# Patient Record
Sex: Female | Born: 1935 | Race: White | Hispanic: No | Smoking: Never smoker
Health system: Southern US, Community
[De-identification: ages and names within clinical notes are randomized; demographics above are authoritative.]

## PROBLEM LIST (undated history)

## (undated) ENCOUNTER — Emergency Department (HOSPITAL_COMMUNITY): Payer: Medicare Other

## (undated) DIAGNOSIS — M109 Gout, unspecified: Secondary | ICD-10-CM

## (undated) DIAGNOSIS — D45 Polycythemia vera: Secondary | ICD-10-CM

## (undated) DIAGNOSIS — I447 Left bundle-branch block, unspecified: Secondary | ICD-10-CM

## (undated) DIAGNOSIS — R55 Syncope and collapse: Secondary | ICD-10-CM

## (undated) DIAGNOSIS — I442 Atrioventricular block, complete: Secondary | ICD-10-CM

## (undated) DIAGNOSIS — E041 Nontoxic single thyroid nodule: Secondary | ICD-10-CM

## (undated) DIAGNOSIS — D751 Secondary polycythemia: Secondary | ICD-10-CM

## (undated) DIAGNOSIS — I639 Cerebral infarction, unspecified: Secondary | ICD-10-CM

## (undated) DIAGNOSIS — E7800 Pure hypercholesterolemia, unspecified: Secondary | ICD-10-CM

## (undated) DIAGNOSIS — I10 Essential (primary) hypertension: Secondary | ICD-10-CM

## (undated) DIAGNOSIS — K922 Gastrointestinal hemorrhage, unspecified: Secondary | ICD-10-CM

## (undated) DIAGNOSIS — I2699 Other pulmonary embolism without acute cor pulmonale: Secondary | ICD-10-CM

## (undated) DIAGNOSIS — R2689 Other abnormalities of gait and mobility: Secondary | ICD-10-CM

## (undated) DIAGNOSIS — M8580 Other specified disorders of bone density and structure, unspecified site: Secondary | ICD-10-CM

## (undated) DIAGNOSIS — D72829 Elevated white blood cell count, unspecified: Secondary | ICD-10-CM

## (undated) DIAGNOSIS — R0600 Dyspnea, unspecified: Secondary | ICD-10-CM

## (undated) HISTORY — DX: Pure hypercholesterolemia, unspecified: E78.00

## (undated) HISTORY — DX: Atrioventricular block, complete: I44.2

## (undated) HISTORY — DX: Essential (primary) hypertension: I10

## (undated) HISTORY — DX: Gastrointestinal hemorrhage, unspecified: K92.2

## (undated) HISTORY — DX: Left bundle-branch block, unspecified: I44.7

## (undated) HISTORY — DX: Dyspnea, unspecified: R06.00

## (undated) HISTORY — DX: Elevated white blood cell count, unspecified: D72.829

## (undated) HISTORY — DX: Polycythemia vera: D45

## (undated) HISTORY — DX: Other specified disorders of bone density and structure, unspecified site: M85.80

## (undated) HISTORY — PX: APPENDECTOMY: SHX54

## (undated) HISTORY — DX: Hypercalcemia: E83.52

## (undated) HISTORY — PX: HAND SURGERY: SHX662

## (undated) HISTORY — DX: Cerebral infarction, unspecified: I63.9

## (undated) HISTORY — DX: Nontoxic single thyroid nodule: E04.1

## (undated) HISTORY — DX: Other abnormalities of gait and mobility: R26.89

## (undated) HISTORY — DX: Syncope and collapse: R55

## (undated) HISTORY — DX: Secondary polycythemia: D75.1

## (undated) HISTORY — DX: Gout, unspecified: M10.9

---

## 2000-09-08 ENCOUNTER — Ambulatory Visit (HOSPITAL_COMMUNITY): Admission: RE | Admit: 2000-09-08 | Discharge: 2000-09-08 | Payer: Self-pay | Admitting: Ophthalmology

## 2001-11-26 ENCOUNTER — Inpatient Hospital Stay (HOSPITAL_COMMUNITY): Admission: EM | Admit: 2001-11-26 | Discharge: 2001-12-01 | Payer: Self-pay | Admitting: Emergency Medicine

## 2001-11-26 ENCOUNTER — Encounter: Payer: Self-pay | Admitting: Emergency Medicine

## 2001-11-27 ENCOUNTER — Encounter: Payer: Self-pay | Admitting: Cardiology

## 2001-11-30 ENCOUNTER — Encounter: Payer: Self-pay | Admitting: Cardiology

## 2005-09-08 ENCOUNTER — Emergency Department (HOSPITAL_COMMUNITY): Admission: EM | Admit: 2005-09-08 | Discharge: 2005-09-08 | Payer: Self-pay | Admitting: Family Medicine

## 2005-09-13 ENCOUNTER — Emergency Department (HOSPITAL_COMMUNITY): Admission: EM | Admit: 2005-09-13 | Discharge: 2005-09-13 | Payer: Self-pay | Admitting: Family Medicine

## 2005-10-07 ENCOUNTER — Emergency Department (HOSPITAL_COMMUNITY): Admission: EM | Admit: 2005-10-07 | Discharge: 2005-10-07 | Payer: Self-pay | Admitting: Family Medicine

## 2007-01-08 ENCOUNTER — Ambulatory Visit: Payer: Self-pay | Admitting: Hematology and Oncology

## 2007-01-19 LAB — COMPREHENSIVE METABOLIC PANEL
Albumin: 4.3 g/dL (ref 3.5–5.2)
Alkaline Phosphatase: 71 U/L (ref 39–117)
BUN: 18 mg/dL (ref 6–23)
Calcium: 10.9 mg/dL — ABNORMAL HIGH (ref 8.4–10.5)
Chloride: 98 mEq/L (ref 96–112)
Creatinine, Ser: 0.81 mg/dL (ref 0.40–1.20)
Glucose, Bld: 85 mg/dL (ref 70–99)
Potassium: 5.2 mEq/L (ref 3.5–5.3)

## 2007-01-19 LAB — URINALYSIS, MICROSCOPIC - CHCC
Bilirubin (Urine): NEGATIVE
Glucose: NEGATIVE g/dL
Leukocyte Esterase: NEGATIVE

## 2007-01-19 LAB — CBC WITH DIFFERENTIAL/PLATELET
Basophils Absolute: 0 10*3/uL (ref 0.0–0.1)
Eosinophils Absolute: 0.2 10*3/uL (ref 0.0–0.5)
HCT: 55.6 % — ABNORMAL HIGH (ref 34.8–46.6)
HGB: 18.8 g/dL — ABNORMAL HIGH (ref 11.6–15.9)
MCV: 76.4 fL — ABNORMAL LOW (ref 81.0–101.0)
MONO%: 14.3 % — ABNORMAL HIGH (ref 0.0–13.0)
NEUT#: 5 10*3/uL (ref 1.5–6.5)
NEUT%: 63.2 % (ref 39.6–76.8)
RDW: 17.7 % — ABNORMAL HIGH (ref 11.3–14.5)

## 2007-01-25 LAB — LEUKOCYTE ALKALINE PHOS: Leukocyte Alkaline  Phos Stain: 119 (ref 33–149)

## 2007-01-26 LAB — CBC WITH DIFFERENTIAL/PLATELET
Basophils Absolute: 0 10*3/uL (ref 0.0–0.1)
EOS%: 4.3 % (ref 0.0–7.0)
Eosinophils Absolute: 0.4 10*3/uL (ref 0.0–0.5)
HGB: 17.6 g/dL — ABNORMAL HIGH (ref 11.6–15.9)
MONO#: 1.4 10*3/uL — ABNORMAL HIGH (ref 0.1–0.9)
NEUT#: 5.3 10*3/uL (ref 1.5–6.5)
RDW: 17.7 % — ABNORMAL HIGH (ref 11.3–14.5)
lymph#: 1.9 10*3/uL (ref 0.9–3.3)

## 2007-02-02 LAB — CBC WITH DIFFERENTIAL/PLATELET
Basophils Absolute: 0.2 10*3/uL — ABNORMAL HIGH (ref 0.0–0.1)
Eosinophils Absolute: 0.4 10*3/uL (ref 0.0–0.5)
HGB: 16.6 g/dL — ABNORMAL HIGH (ref 11.6–15.9)
MCV: 77.5 fL — ABNORMAL LOW (ref 81.0–101.0)
MONO%: 17.7 % — ABNORMAL HIGH (ref 0.0–13.0)
NEUT#: 5 10*3/uL (ref 1.5–6.5)
Platelets: 643 10*3/uL — ABNORMAL HIGH (ref 145–400)
RDW: 14.2 % (ref 11.3–14.5)

## 2007-02-09 LAB — CBC WITH DIFFERENTIAL/PLATELET
Basophils Absolute: 0.1 10*3/uL (ref 0.0–0.1)
Eosinophils Absolute: 0.4 10*3/uL (ref 0.0–0.5)
LYMPH%: 21.8 % (ref 14.0–48.0)
MCV: 77.5 fL — ABNORMAL LOW (ref 81.0–101.0)
MONO%: 15.2 % — ABNORMAL HIGH (ref 0.0–13.0)
NEUT#: 5.4 10*3/uL (ref 1.5–6.5)
NEUT%: 57.8 % (ref 39.6–76.8)
Platelets: 648 10*3/uL — ABNORMAL HIGH (ref 145–400)
RBC: 5.91 10*6/uL — ABNORMAL HIGH (ref 3.70–5.32)

## 2007-02-10 ENCOUNTER — Ambulatory Visit (HOSPITAL_COMMUNITY): Admission: RE | Admit: 2007-02-10 | Discharge: 2007-02-10 | Payer: Self-pay | Admitting: Hematology and Oncology

## 2007-02-16 LAB — CBC WITH DIFFERENTIAL/PLATELET
BASO%: 0.6 % (ref 0.0–2.0)
EOS%: 4.8 % (ref 0.0–7.0)
LYMPH%: 23.9 % (ref 14.0–48.0)
MCH: 26.1 pg (ref 26.0–34.0)
MCHC: 34.3 g/dL (ref 32.0–36.0)
MCV: 76 fL — ABNORMAL LOW (ref 81.0–101.0)
MONO%: 11.3 % (ref 0.0–13.0)
Platelets: 768 10*3/uL — ABNORMAL HIGH (ref 145–400)
RBC: 5.92 10*6/uL — ABNORMAL HIGH (ref 3.70–5.32)
WBC: 8 10*3/uL (ref 3.9–10.0)

## 2007-02-16 LAB — BASIC METABOLIC PANEL
BUN: 17 mg/dL (ref 6–23)
CO2: 25 mEq/L (ref 19–32)
Chloride: 98 mEq/L (ref 96–112)
Creatinine, Ser: 0.73 mg/dL (ref 0.40–1.20)
Potassium: 4.9 mEq/L (ref 3.5–5.3)

## 2007-02-19 ENCOUNTER — Ambulatory Visit: Payer: Self-pay | Admitting: Hematology and Oncology

## 2007-02-23 LAB — CBC WITH DIFFERENTIAL/PLATELET
Basophils Absolute: 0.1 10*3/uL (ref 0.0–0.1)
EOS%: 4.8 % (ref 0.0–7.0)
Eosinophils Absolute: 0.4 10*3/uL (ref 0.0–0.5)
HGB: 15 g/dL (ref 11.6–15.9)
LYMPH%: 26.1 % (ref 14.0–48.0)
MCH: 25.6 pg — ABNORMAL LOW (ref 26.0–34.0)
MCV: 77.4 fL — ABNORMAL LOW (ref 81.0–101.0)
MONO%: 11.6 % (ref 0.0–13.0)
Platelets: 687 10*3/uL — ABNORMAL HIGH (ref 145–400)
RBC: 5.86 10*6/uL — ABNORMAL HIGH (ref 3.70–5.32)
RDW: 13.2 % (ref 11.3–14.5)

## 2007-03-03 LAB — BASIC METABOLIC PANEL
BUN: 14 mg/dL (ref 6–23)
CO2: 26 mEq/L (ref 19–32)
Chloride: 96 mEq/L (ref 96–112)
Creatinine, Ser: 0.76 mg/dL (ref 0.40–1.20)
Glucose, Bld: 84 mg/dL (ref 70–99)

## 2007-03-03 LAB — CBC WITH DIFFERENTIAL/PLATELET
Basophils Absolute: 0.1 10*3/uL (ref 0.0–0.1)
MCHC: 33.5 g/dL (ref 32.0–36.0)
MCV: 77.1 fL — ABNORMAL LOW (ref 81.0–101.0)
MONO#: 0.8 10*3/uL (ref 0.1–0.9)
NEUT#: 4 10*3/uL (ref 1.5–6.5)
NEUT%: 58.5 % (ref 39.6–76.8)
Platelets: 604 10*3/uL — ABNORMAL HIGH (ref 145–400)
lymph#: 1.6 10*3/uL (ref 0.9–3.3)

## 2007-03-09 LAB — TECHNOLOGIST REVIEW

## 2007-03-09 LAB — CBC WITH DIFFERENTIAL/PLATELET
Basophils Absolute: 0.1 10*3/uL (ref 0.0–0.1)
EOS%: 4.7 % (ref 0.0–7.0)
MCH: 25.1 pg — ABNORMAL LOW (ref 26.0–34.0)
MCV: 77.7 fL — ABNORMAL LOW (ref 81.0–101.0)
MONO%: 12.5 % (ref 0.0–13.0)
RBC: 6.09 10*6/uL — ABNORMAL HIGH (ref 3.70–5.32)
RDW: 13.4 % (ref 11.3–14.5)

## 2007-03-24 LAB — CBC WITH DIFFERENTIAL/PLATELET
Basophils Absolute: 0 10*3/uL (ref 0.0–0.1)
EOS%: 4.6 % (ref 0.0–7.0)
Eosinophils Absolute: 0.3 10*3/uL (ref 0.0–0.5)
HCT: 42.4 % (ref 34.8–46.6)
HGB: 14.2 g/dL (ref 11.6–15.9)
MCH: 26 pg (ref 26.0–34.0)
MCV: 77.6 fL — ABNORMAL LOW (ref 81.0–101.0)
MONO%: 9.9 % (ref 0.0–13.0)
NEUT%: 60.1 % (ref 39.6–76.8)

## 2007-04-05 ENCOUNTER — Ambulatory Visit: Payer: Self-pay | Admitting: Hematology and Oncology

## 2007-04-07 LAB — CBC WITH DIFFERENTIAL/PLATELET
Basophils Absolute: 0.1 10*3/uL (ref 0.0–0.1)
EOS%: 5.1 % (ref 0.0–7.0)
HGB: 14.2 g/dL (ref 11.6–15.9)
LYMPH%: 25.2 % (ref 14.0–48.0)
MCH: 25.8 pg — ABNORMAL LOW (ref 26.0–34.0)
MCV: 79.1 fL — ABNORMAL LOW (ref 81.0–101.0)
MONO%: 12.5 % (ref 0.0–13.0)
Platelets: 376 10*3/uL (ref 145–400)
RBC: 5.52 10*6/uL — ABNORMAL HIGH (ref 3.70–5.32)
RDW: 12.9 % (ref 11.3–14.5)

## 2007-04-14 LAB — CBC WITH DIFFERENTIAL/PLATELET
BASO%: 1.6 % (ref 0.0–2.0)
EOS%: 4.9 % (ref 0.0–7.0)
Eosinophils Absolute: 0.4 10*3/uL (ref 0.0–0.5)
LYMPH%: 21.1 % (ref 14.0–48.0)
MCHC: 32.2 g/dL (ref 32.0–36.0)
MCV: 78.2 fL — ABNORMAL LOW (ref 81.0–101.0)
MONO%: 13.4 % — ABNORMAL HIGH (ref 0.0–13.0)
NEUT#: 4.9 10*3/uL (ref 1.5–6.5)
Platelets: 326 10*3/uL (ref 145–400)
RBC: 5.76 10*6/uL — ABNORMAL HIGH (ref 3.70–5.32)
RDW: 13.1 % (ref 11.3–14.5)

## 2007-04-21 LAB — CBC WITH DIFFERENTIAL/PLATELET
BASO%: 1.5 % (ref 0.0–2.0)
Eosinophils Absolute: 0.4 10*3/uL (ref 0.0–0.5)
MCHC: 32.1 g/dL (ref 32.0–36.0)
MONO#: 1.3 10*3/uL — ABNORMAL HIGH (ref 0.1–0.9)
NEUT#: 4.2 10*3/uL (ref 1.5–6.5)
Platelets: 311 10*3/uL (ref 145–400)
RBC: 5.62 10*6/uL — ABNORMAL HIGH (ref 3.70–5.32)
RDW: 12.9 % (ref 11.3–14.5)
WBC: 7.7 10*3/uL (ref 3.9–10.0)
lymph#: 1.7 10*3/uL (ref 0.9–3.3)

## 2007-05-05 LAB — CBC WITH DIFFERENTIAL/PLATELET
BASO%: 1.7 % (ref 0.0–2.0)
Eosinophils Absolute: 0.5 10*3/uL (ref 0.0–0.5)
HCT: 46.5 % (ref 34.8–46.6)
LYMPH%: 24.2 % (ref 14.0–48.0)
MCHC: 32.4 g/dL (ref 32.0–36.0)
MONO#: 1.5 10*3/uL — ABNORMAL HIGH (ref 0.1–0.9)
NEUT#: 5 10*3/uL (ref 1.5–6.5)
NEUT%: 53.3 % (ref 39.6–76.8)
Platelets: 361 10*3/uL (ref 145–400)
WBC: 9.4 10*3/uL (ref 3.9–10.0)
lymph#: 2.3 10*3/uL (ref 0.9–3.3)

## 2007-05-24 ENCOUNTER — Ambulatory Visit: Payer: Self-pay | Admitting: Hematology and Oncology

## 2007-05-26 LAB — CBC WITH DIFFERENTIAL/PLATELET
BASO%: 1.4 % (ref 0.0–2.0)
EOS%: 4.2 % (ref 0.0–7.0)
HCT: 45 % (ref 34.8–46.6)
LYMPH%: 21.2 % (ref 14.0–48.0)
MCH: 25.1 pg — ABNORMAL LOW (ref 26.0–34.0)
MCHC: 32.8 g/dL (ref 32.0–36.0)
MCV: 76.6 fL — ABNORMAL LOW (ref 81.0–101.0)
MONO%: 19.3 % — ABNORMAL HIGH (ref 0.0–13.0)
NEUT%: 53.9 % (ref 39.6–76.8)
Platelets: 299 10*3/uL (ref 145–400)

## 2007-05-26 LAB — BASIC METABOLIC PANEL
CO2: 24 mEq/L (ref 19–32)
Calcium: 9.5 mg/dL (ref 8.4–10.5)
Creatinine, Ser: 0.67 mg/dL (ref 0.40–1.20)

## 2007-06-24 LAB — CBC WITH DIFFERENTIAL/PLATELET
Basophils Absolute: 0.2 10*3/uL — ABNORMAL HIGH (ref 0.0–0.1)
EOS%: 4.8 % (ref 0.0–7.0)
HGB: 14.3 g/dL (ref 11.6–15.9)
MCH: 24.5 pg — ABNORMAL LOW (ref 26.0–34.0)
NEUT#: 4.9 10*3/uL (ref 1.5–6.5)
RDW: 13.2 % (ref 11.3–14.5)
WBC: 9.6 10*3/uL (ref 3.9–10.0)
lymph#: 2.2 10*3/uL (ref 0.9–3.3)

## 2007-06-24 LAB — BASIC METABOLIC PANEL
BUN: 15 mg/dL (ref 6–23)
Chloride: 105 mEq/L (ref 96–112)
Potassium: 4.8 mEq/L (ref 3.5–5.3)

## 2007-07-06 ENCOUNTER — Ambulatory Visit: Payer: Self-pay | Admitting: Hematology and Oncology

## 2007-07-08 LAB — CBC WITH DIFFERENTIAL/PLATELET
BASO%: 1.1 % (ref 0.0–2.0)
Basophils Absolute: 0.1 10*3/uL (ref 0.0–0.1)
EOS%: 4.2 % (ref 0.0–7.0)
Eosinophils Absolute: 0.4 10*3/uL (ref 0.0–0.5)
HCT: 47.3 % — ABNORMAL HIGH (ref 34.8–46.6)
HGB: 14.9 g/dL (ref 11.6–15.9)
LYMPH%: 22.5 % (ref 14.0–48.0)
MCH: 24.3 pg — ABNORMAL LOW (ref 26.0–34.0)
MCHC: 31.4 g/dL — ABNORMAL LOW (ref 32.0–36.0)
MCV: 77.3 fL — ABNORMAL LOW (ref 81.0–101.0)
MONO#: 1.5 10*3/uL — ABNORMAL HIGH (ref 0.1–0.9)
MONO%: 17.7 % — ABNORMAL HIGH (ref 0.0–13.0)
NEUT#: 4.8 10*3/uL (ref 1.5–6.5)
NEUT%: 54.5 % (ref 39.6–76.8)
Platelets: 314 10*3/uL (ref 145–400)
RBC: 6.12 10*6/uL — ABNORMAL HIGH (ref 3.70–5.32)
RDW: 13.2 % (ref 11.3–14.5)
WBC: 8.7 10*3/uL (ref 3.9–10.0)
lymph#: 2 10*3/uL (ref 0.9–3.3)

## 2007-07-28 LAB — CBC WITH DIFFERENTIAL/PLATELET
BASO%: 1.9 % (ref 0.0–2.0)
Basophils Absolute: 0.1 10*3/uL (ref 0.0–0.1)
EOS%: 4.3 % (ref 0.0–7.0)
HCT: 48.6 % — ABNORMAL HIGH (ref 34.8–46.6)
HGB: 14.9 g/dL (ref 11.6–15.9)
MONO#: 1 10*3/uL — ABNORMAL HIGH (ref 0.1–0.9)
NEUT%: 56 % (ref 39.6–76.8)
RDW: 17.9 % — ABNORMAL HIGH (ref 11.3–14.5)
WBC: 7.4 10*3/uL (ref 3.9–10.0)
lymph#: 1.8 10*3/uL (ref 0.9–3.3)

## 2007-07-28 LAB — BASIC METABOLIC PANEL
CO2: 27 mEq/L (ref 19–32)
Chloride: 100 mEq/L (ref 96–112)
Creatinine, Ser: 0.9 mg/dL (ref 0.40–1.20)
Potassium: 4.2 mEq/L (ref 3.5–5.3)
Sodium: 136 mEq/L (ref 135–145)

## 2007-08-23 ENCOUNTER — Ambulatory Visit: Payer: Self-pay | Admitting: Hematology and Oncology

## 2007-08-25 LAB — CBC WITH DIFFERENTIAL/PLATELET
BASO%: 0.6 % (ref 0.0–2.0)
EOS%: 4.4 % (ref 0.0–7.0)
LYMPH%: 23.4 % (ref 14.0–48.0)
MCH: 25.5 pg — ABNORMAL LOW (ref 26.0–34.0)
MCHC: 32.9 g/dL (ref 32.0–36.0)
MCV: 77.6 fL — ABNORMAL LOW (ref 81.0–101.0)
MONO%: 16 % — ABNORMAL HIGH (ref 0.0–13.0)
NEUT%: 55.6 % (ref 39.6–76.8)
Platelets: 353 10*3/uL (ref 145–400)
RBC: 5.63 10*6/uL — ABNORMAL HIGH (ref 3.70–5.32)
WBC: 9.2 10*3/uL (ref 3.9–10.0)

## 2007-08-25 LAB — BASIC METABOLIC PANEL
Calcium: 10.1 mg/dL (ref 8.4–10.5)
Creatinine, Ser: 0.67 mg/dL (ref 0.40–1.20)
Sodium: 140 mEq/L (ref 135–145)

## 2007-09-22 LAB — BASIC METABOLIC PANEL
BUN: 13 mg/dL (ref 6–23)
CO2: 24 mEq/L (ref 19–32)
Chloride: 104 mEq/L (ref 96–112)
Glucose, Bld: 101 mg/dL — ABNORMAL HIGH (ref 70–99)
Potassium: 4.5 mEq/L (ref 3.5–5.3)

## 2007-09-22 LAB — CBC WITH DIFFERENTIAL/PLATELET
Basophils Absolute: 0.1 10*3/uL (ref 0.0–0.1)
Eosinophils Absolute: 0.3 10*3/uL (ref 0.0–0.5)
HGB: 15.2 g/dL (ref 11.6–15.9)
MCV: 78.8 fL — ABNORMAL LOW (ref 81.0–101.0)
MONO#: 1.3 10*3/uL — ABNORMAL HIGH (ref 0.1–0.9)
MONO%: 16.9 % — ABNORMAL HIGH (ref 0.0–13.0)
NEUT#: 4.3 10*3/uL (ref 1.5–6.5)
RDW: 18.2 % — ABNORMAL HIGH (ref 11.3–14.5)

## 2007-10-22 ENCOUNTER — Ambulatory Visit: Payer: Self-pay | Admitting: Hematology and Oncology

## 2007-10-26 LAB — CBC WITH DIFFERENTIAL/PLATELET
BASO%: 0.4 % (ref 0.0–2.0)
Basophils Absolute: 0 10*3/uL (ref 0.0–0.1)
EOS%: 4.1 % (ref 0.0–7.0)
HCT: 45.5 % (ref 34.8–46.6)
HGB: 14.6 g/dL (ref 11.6–15.9)
LYMPH%: 20.1 % (ref 14.0–48.0)
MCH: 25 pg — ABNORMAL LOW (ref 26.0–34.0)
MCHC: 32 g/dL (ref 32.0–36.0)
MONO#: 1 10*3/uL — ABNORMAL HIGH (ref 0.1–0.9)
NEUT%: 63.3 % (ref 39.6–76.8)
Platelets: 605 10*3/uL — ABNORMAL HIGH (ref 145–400)
lymph#: 1.7 10*3/uL (ref 0.9–3.3)

## 2007-10-26 LAB — BASIC METABOLIC PANEL
BUN: 17 mg/dL (ref 6–23)
CO2: 25 mEq/L (ref 19–32)
Calcium: 9.5 mg/dL (ref 8.4–10.5)
Chloride: 103 mEq/L (ref 96–112)
Creatinine, Ser: 0.73 mg/dL (ref 0.40–1.20)

## 2007-11-23 LAB — BASIC METABOLIC PANEL
CO2: 25 mEq/L (ref 19–32)
Calcium: 9.9 mg/dL (ref 8.4–10.5)
Glucose, Bld: 88 mg/dL (ref 70–99)
Potassium: 4.9 mEq/L (ref 3.5–5.3)
Sodium: 139 mEq/L (ref 135–145)

## 2007-11-23 LAB — CBC WITH DIFFERENTIAL/PLATELET
BASO%: 0.5 % (ref 0.0–2.0)
Eosinophils Absolute: 0.5 10*3/uL (ref 0.0–0.5)
LYMPH%: 19.6 % (ref 14.0–48.0)
MCHC: 32.4 g/dL (ref 32.0–36.0)
MONO#: 1.3 10*3/uL — ABNORMAL HIGH (ref 0.1–0.9)
NEUT#: 4.7 10*3/uL (ref 1.5–6.5)
RBC: 5.81 10*6/uL — ABNORMAL HIGH (ref 3.70–5.32)
RDW: 16.9 % — ABNORMAL HIGH (ref 11.3–14.5)
WBC: 8.1 10*3/uL (ref 3.9–10.0)
lymph#: 1.6 10*3/uL (ref 0.9–3.3)

## 2007-12-20 ENCOUNTER — Ambulatory Visit: Payer: Self-pay | Admitting: Hematology and Oncology

## 2007-12-22 ENCOUNTER — Observation Stay (HOSPITAL_COMMUNITY): Admission: EM | Admit: 2007-12-22 | Discharge: 2007-12-23 | Payer: Self-pay | Admitting: Emergency Medicine

## 2007-12-22 LAB — CBC WITH DIFFERENTIAL/PLATELET
BASO%: 0.2 % (ref 0.0–2.0)
EOS%: 5.9 % (ref 0.0–7.0)
Eosinophils Absolute: 0.5 10*3/uL (ref 0.0–0.5)
LYMPH%: 22.6 % (ref 14.0–48.0)
MCH: 25.6 pg — ABNORMAL LOW (ref 26.0–34.0)
MCHC: 33.2 g/dL (ref 32.0–36.0)
MCV: 76.9 fL — ABNORMAL LOW (ref 81.0–101.0)
MONO%: 12 % (ref 0.0–13.0)
NEUT#: 4.8 10*3/uL (ref 1.5–6.5)
Platelets: 398 10*3/uL (ref 145–400)
RBC: 5.94 10*6/uL — ABNORMAL HIGH (ref 3.70–5.32)
RDW: 19 % — ABNORMAL HIGH (ref 11.3–14.5)

## 2007-12-23 ENCOUNTER — Ambulatory Visit: Payer: Self-pay | Admitting: Surgery

## 2007-12-23 ENCOUNTER — Encounter (INDEPENDENT_AMBULATORY_CARE_PROVIDER_SITE_OTHER): Payer: Self-pay | Admitting: Internal Medicine

## 2008-01-28 ENCOUNTER — Ambulatory Visit: Payer: Self-pay | Admitting: Hematology and Oncology

## 2008-02-02 LAB — BASIC METABOLIC PANEL
BUN: 19 mg/dL (ref 6–23)
Chloride: 96 mEq/L (ref 96–112)
Creatinine, Ser: 0.78 mg/dL (ref 0.40–1.20)
Glucose, Bld: 87 mg/dL (ref 70–99)

## 2008-02-02 LAB — CBC WITH DIFFERENTIAL/PLATELET
Basophils Absolute: 0 10*3/uL (ref 0.0–0.1)
EOS%: 6.2 % (ref 0.0–7.0)
HCT: 48 % — ABNORMAL HIGH (ref 34.8–46.6)
HGB: 15.9 g/dL (ref 11.6–15.9)
MCH: 26.6 pg (ref 26.0–34.0)
NEUT%: 60.1 % (ref 39.6–76.8)
lymph#: 1.7 10*3/uL (ref 0.9–3.3)

## 2008-02-25 ENCOUNTER — Encounter
Admission: RE | Admit: 2008-02-25 | Discharge: 2008-02-25 | Payer: Self-pay | Admitting: Physical Medicine and Rehabilitation

## 2008-03-02 LAB — CBC WITH DIFFERENTIAL/PLATELET
BASO%: 0.5 % (ref 0.0–2.0)
LYMPH%: 20.2 % (ref 14.0–48.0)
MCHC: 32.6 g/dL (ref 32.0–36.0)
MONO#: 1 10*3/uL — ABNORMAL HIGH (ref 0.1–0.9)
RBC: 5.97 10*6/uL — ABNORMAL HIGH (ref 3.70–5.32)
RDW: 20.3 % — ABNORMAL HIGH (ref 11.3–14.5)
WBC: 7.3 10*3/uL (ref 3.9–10.0)
lymph#: 1.5 10*3/uL (ref 0.9–3.3)

## 2008-03-02 LAB — BASIC METABOLIC PANEL
CO2: 28 mEq/L (ref 19–32)
Calcium: 10 mg/dL (ref 8.4–10.5)
Chloride: 93 mEq/L — ABNORMAL LOW (ref 96–112)
Potassium: 4.7 mEq/L (ref 3.5–5.3)
Sodium: 132 mEq/L — ABNORMAL LOW (ref 135–145)

## 2008-03-28 ENCOUNTER — Ambulatory Visit: Payer: Self-pay | Admitting: Hematology and Oncology

## 2008-03-30 LAB — CBC WITH DIFFERENTIAL/PLATELET
BASO%: 0.1 % (ref 0.0–2.0)
EOS%: 4.3 % (ref 0.0–7.0)
LYMPH%: 23.7 % (ref 14.0–48.0)
MCHC: 32.9 g/dL (ref 32.0–36.0)
MCV: 84.8 fL (ref 81.0–101.0)
MONO%: 12.1 % (ref 0.0–13.0)
Platelets: 427 10*3/uL — ABNORMAL HIGH (ref 145–400)
RBC: 5.74 10*6/uL — ABNORMAL HIGH (ref 3.70–5.32)
RDW: 18.3 % — ABNORMAL HIGH (ref 11.3–14.5)

## 2008-05-03 LAB — CBC WITH DIFFERENTIAL/PLATELET
Basophils Absolute: 0 10*3/uL (ref 0.0–0.1)
EOS%: 3.5 % (ref 0.0–7.0)
Eosinophils Absolute: 0.3 10*3/uL (ref 0.0–0.5)
LYMPH%: 22.5 % (ref 14.0–48.0)
MCH: 29.2 pg (ref 26.0–34.0)
MCV: 88 fL (ref 81.0–101.0)
MONO%: 18.6 % — ABNORMAL HIGH (ref 0.0–13.0)
NEUT#: 4.2 10*3/uL (ref 1.5–6.5)
Platelets: 420 10*3/uL — ABNORMAL HIGH (ref 145–400)
RBC: 5.39 10*6/uL — ABNORMAL HIGH (ref 3.70–5.32)

## 2008-05-30 ENCOUNTER — Ambulatory Visit: Payer: Self-pay | Admitting: Hematology and Oncology

## 2008-06-01 LAB — CBC WITH DIFFERENTIAL/PLATELET
Basophils Absolute: 0 10*3/uL (ref 0.0–0.1)
EOS%: 3.5 % (ref 0.0–7.0)
Eosinophils Absolute: 0.3 10*3/uL (ref 0.0–0.5)
MONO#: 1.2 10*3/uL — ABNORMAL HIGH (ref 0.1–0.9)
MONO%: 14 % — ABNORMAL HIGH (ref 0.0–13.0)
NEUT#: 5.2 10*3/uL (ref 1.5–6.5)
NEUT%: 61.7 % (ref 39.6–76.8)
Platelets: 448 10*3/uL — ABNORMAL HIGH (ref 145–400)
RBC: 5.42 10*6/uL — ABNORMAL HIGH (ref 3.70–5.32)
RDW: 16.4 % — ABNORMAL HIGH (ref 11.3–14.5)
WBC: 8.4 10*3/uL (ref 3.9–10.0)
lymph#: 1.7 10*3/uL (ref 0.9–3.3)

## 2008-07-04 LAB — CBC WITH DIFFERENTIAL/PLATELET
BASO%: 0.8 % (ref 0.0–2.0)
Eosinophils Absolute: 0.3 10*3/uL (ref 0.0–0.5)
MCHC: 33 g/dL (ref 32.0–36.0)
MCV: 88.7 fL (ref 81.0–101.0)
MONO%: 17.2 % — ABNORMAL HIGH (ref 0.0–13.0)
NEUT#: 4.4 10*3/uL (ref 1.5–6.5)
RBC: 5.32 10*6/uL (ref 3.70–5.32)
RDW: 16.1 % — ABNORMAL HIGH (ref 11.3–14.5)
WBC: 7.7 10*3/uL (ref 3.9–10.0)

## 2008-07-28 ENCOUNTER — Ambulatory Visit: Payer: Self-pay | Admitting: Hematology and Oncology

## 2008-08-01 LAB — CBC WITH DIFFERENTIAL/PLATELET
Basophils Absolute: 0 10*3/uL (ref 0.0–0.1)
EOS%: 3.3 % (ref 0.0–7.0)
MCH: 29.1 pg (ref 26.0–34.0)
MCV: 87.8 fL (ref 81.0–101.0)
MONO%: 14.9 % — ABNORMAL HIGH (ref 0.0–13.0)
RBC: 5.67 10*6/uL — ABNORMAL HIGH (ref 3.70–5.32)
RDW: 14.7 % — ABNORMAL HIGH (ref 11.3–14.5)

## 2008-08-31 LAB — CBC WITH DIFFERENTIAL/PLATELET
Basophils Absolute: 0 10*3/uL (ref 0.0–0.1)
HCT: 48.8 % — ABNORMAL HIGH (ref 34.8–46.6)
HGB: 16.2 g/dL — ABNORMAL HIGH (ref 11.6–15.9)
MCH: 28.8 pg (ref 25.1–34.0)
MONO#: 0.9 10*3/uL (ref 0.1–0.9)
NEUT%: 62.7 % (ref 38.4–76.8)
WBC: 7 10*3/uL (ref 3.9–10.3)
lymph#: 1.5 10*3/uL (ref 0.9–3.3)

## 2008-08-31 LAB — BASIC METABOLIC PANEL
BUN: 16 mg/dL (ref 6–23)
CO2: 25 mEq/L (ref 19–32)
Chloride: 99 mEq/L (ref 96–112)
Creatinine, Ser: 0.74 mg/dL (ref 0.40–1.20)
Glucose, Bld: 92 mg/dL (ref 70–99)

## 2008-10-03 ENCOUNTER — Ambulatory Visit: Payer: Self-pay | Admitting: Hematology and Oncology

## 2008-10-05 LAB — CBC WITH DIFFERENTIAL/PLATELET
BASO%: 0.6 % (ref 0.0–2.0)
Basophils Absolute: 0 10*3/uL (ref 0.0–0.1)
EOS%: 3.4 % (ref 0.0–7.0)
HGB: 15.9 g/dL (ref 11.6–15.9)
MCH: 28.7 pg (ref 25.1–34.0)
MCHC: 33.1 g/dL (ref 31.5–36.0)
MCV: 86.9 fL (ref 79.5–101.0)
MONO%: 12.7 % (ref 0.0–14.0)
NEUT%: 63.4 % (ref 38.4–76.8)
RDW: 16.4 % — ABNORMAL HIGH (ref 11.2–14.5)

## 2008-10-05 LAB — BASIC METABOLIC PANEL
CO2: 28 mEq/L (ref 19–32)
Calcium: 9.7 mg/dL (ref 8.4–10.5)
Sodium: 137 mEq/L (ref 135–145)

## 2008-11-02 LAB — CBC WITH DIFFERENTIAL/PLATELET
Basophils Absolute: 0.1 10*3/uL (ref 0.0–0.1)
EOS%: 3.9 % (ref 0.0–7.0)
HCT: 48.4 % — ABNORMAL HIGH (ref 34.8–46.6)
HGB: 15.9 g/dL (ref 11.6–15.9)
LYMPH%: 17.5 % (ref 14.0–49.7)
MCH: 28.4 pg (ref 25.1–34.0)
MCHC: 32.9 g/dL (ref 31.5–36.0)
MCV: 86.3 fL (ref 79.5–101.0)
NEUT%: 63.3 % (ref 38.4–76.8)
Platelets: 422 10*3/uL — ABNORMAL HIGH (ref 145–400)
lymph#: 1.4 10*3/uL (ref 0.9–3.3)

## 2008-12-01 ENCOUNTER — Ambulatory Visit: Payer: Self-pay | Admitting: Hematology and Oncology

## 2008-12-06 LAB — CBC WITH DIFFERENTIAL/PLATELET
BASO%: 0.5 % (ref 0.0–2.0)
Eosinophils Absolute: 0.3 10*3/uL (ref 0.0–0.5)
MCHC: 34.6 g/dL (ref 31.5–36.0)
MONO#: 0.8 10*3/uL (ref 0.1–0.9)
NEUT#: 3.6 10*3/uL (ref 1.5–6.5)
Platelets: 349 10*3/uL (ref 145–400)
RBC: 5.56 10*6/uL — ABNORMAL HIGH (ref 3.70–5.45)
WBC: 6 10*3/uL (ref 3.9–10.3)
lymph#: 1.4 10*3/uL (ref 0.9–3.3)

## 2008-12-06 LAB — BASIC METABOLIC PANEL
CO2: 25 mEq/L (ref 19–32)
Calcium: 10.6 mg/dL — ABNORMAL HIGH (ref 8.4–10.5)
Chloride: 92 mEq/L — ABNORMAL LOW (ref 96–112)
Glucose, Bld: 89 mg/dL (ref 70–99)
Potassium: 4.7 mEq/L (ref 3.5–5.3)
Sodium: 129 mEq/L — ABNORMAL LOW (ref 135–145)

## 2009-01-01 ENCOUNTER — Ambulatory Visit: Payer: Self-pay | Admitting: Hematology and Oncology

## 2009-01-03 LAB — CBC WITH DIFFERENTIAL/PLATELET
BASO%: 0.6 % (ref 0.0–2.0)
Eosinophils Absolute: 0.2 10*3/uL (ref 0.0–0.5)
LYMPH%: 20.9 % (ref 14.0–49.7)
MCHC: 33.2 g/dL (ref 31.5–36.0)
MCV: 90 fL (ref 79.5–101.0)
MONO#: 0.9 10*3/uL (ref 0.1–0.9)
MONO%: 13.7 % (ref 0.0–14.0)
NEUT#: 4 10*3/uL (ref 1.5–6.5)
Platelets: 410 10*3/uL — ABNORMAL HIGH (ref 145–400)
RBC: 5.2 10*6/uL (ref 3.70–5.45)
RDW: 17.5 % — ABNORMAL HIGH (ref 11.2–14.5)
WBC: 6.5 10*3/uL (ref 3.9–10.3)

## 2009-01-29 ENCOUNTER — Ambulatory Visit: Payer: Self-pay | Admitting: Hematology and Oncology

## 2009-01-31 LAB — CBC WITH DIFFERENTIAL/PLATELET
EOS%: 3.7 % (ref 0.0–7.0)
LYMPH%: 22.4 % (ref 14.0–49.7)
MCH: 30 pg (ref 25.1–34.0)
MCV: 91.2 fL (ref 79.5–101.0)
MONO%: 14.5 % — ABNORMAL HIGH (ref 0.0–14.0)
Platelets: 363 10*3/uL (ref 145–400)
RBC: 5.27 10*6/uL (ref 3.70–5.45)
RDW: 17 % — ABNORMAL HIGH (ref 11.2–14.5)

## 2009-02-26 ENCOUNTER — Ambulatory Visit: Payer: Self-pay | Admitting: Hematology and Oncology

## 2009-02-28 LAB — BASIC METABOLIC PANEL
CO2: 27 mEq/L (ref 19–32)
Calcium: 10.5 mg/dL (ref 8.4–10.5)
Chloride: 102 mEq/L (ref 96–112)
Glucose, Bld: 105 mg/dL — ABNORMAL HIGH (ref 70–99)
Potassium: 4.5 mEq/L (ref 3.5–5.3)
Sodium: 139 mEq/L (ref 135–145)

## 2009-02-28 LAB — CBC WITH DIFFERENTIAL/PLATELET
Eosinophils Absolute: 0.2 10*3/uL (ref 0.0–0.5)
HCT: 49.5 % — ABNORMAL HIGH (ref 34.8–46.6)
LYMPH%: 20.5 % (ref 14.0–49.7)
MONO#: 0.7 10*3/uL (ref 0.1–0.9)
NEUT#: 4.1 10*3/uL (ref 1.5–6.5)
Platelets: 400 10*3/uL (ref 145–400)
RBC: 5.47 10*6/uL — ABNORMAL HIGH (ref 3.70–5.45)
WBC: 6.4 10*3/uL (ref 3.9–10.3)
lymph#: 1.3 10*3/uL (ref 0.9–3.3)

## 2009-03-01 HISTORY — PX: US ECHOCARDIOGRAPHY: HXRAD669

## 2009-03-06 LAB — CBC WITH DIFFERENTIAL/PLATELET
BASO%: 0.9 % (ref 0.0–2.0)
Basophils Absolute: 0 10*3/uL (ref 0.0–0.1)
EOS%: 4.9 % (ref 0.0–7.0)
HCT: 50.3 % — ABNORMAL HIGH (ref 34.8–46.6)
HGB: 16.7 g/dL — ABNORMAL HIGH (ref 11.6–15.9)
LYMPH%: 22 % (ref 14.0–49.7)
MCH: 29.8 pg (ref 25.1–34.0)
MCHC: 33.2 g/dL (ref 31.5–36.0)
MCV: 89.7 fL (ref 79.5–101.0)
MONO%: 19.5 % — ABNORMAL HIGH (ref 0.0–14.0)
NEUT%: 52.7 % (ref 38.4–76.8)
Platelets: 288 10*3/uL (ref 145–400)

## 2009-03-09 ENCOUNTER — Encounter: Payer: Self-pay | Admitting: Cardiology

## 2009-03-09 ENCOUNTER — Inpatient Hospital Stay (HOSPITAL_COMMUNITY): Admission: EM | Admit: 2009-03-09 | Discharge: 2009-03-14 | Payer: Self-pay | Admitting: Emergency Medicine

## 2009-03-11 ENCOUNTER — Encounter: Payer: Self-pay | Admitting: Cardiology

## 2009-03-12 ENCOUNTER — Ambulatory Visit: Payer: Self-pay | Admitting: Vascular Surgery

## 2009-03-12 ENCOUNTER — Encounter (INDEPENDENT_AMBULATORY_CARE_PROVIDER_SITE_OTHER): Payer: Self-pay | Admitting: Cardiology

## 2009-03-23 ENCOUNTER — Ambulatory Visit: Payer: Self-pay | Admitting: Hematology and Oncology

## 2009-03-26 ENCOUNTER — Inpatient Hospital Stay (HOSPITAL_COMMUNITY): Admission: AD | Admit: 2009-03-26 | Discharge: 2009-03-29 | Payer: Self-pay | Admitting: Cardiology

## 2009-03-26 ENCOUNTER — Encounter: Payer: Self-pay | Admitting: Cardiology

## 2009-03-26 HISTORY — PX: TRANSTHORACIC ECHOCARDIOGRAM: SHX275

## 2009-03-26 HISTORY — PX: PACEMAKER INSERTION: SHX728

## 2009-03-27 ENCOUNTER — Encounter: Payer: Self-pay | Admitting: Cardiology

## 2009-03-30 ENCOUNTER — Inpatient Hospital Stay (HOSPITAL_COMMUNITY): Admission: AD | Admit: 2009-03-30 | Discharge: 2009-04-02 | Payer: Self-pay | Admitting: Cardiology

## 2009-04-25 ENCOUNTER — Ambulatory Visit: Payer: Self-pay | Admitting: Oncology

## 2009-04-25 LAB — CBC WITH DIFFERENTIAL/PLATELET
BASO%: 0.4 % (ref 0.0–2.0)
Eosinophils Absolute: 0.2 10*3/uL (ref 0.0–0.5)
MONO#: 0.8 10*3/uL (ref 0.1–0.9)
MONO%: 12.8 % (ref 0.0–14.0)
NEUT#: 3.7 10*3/uL (ref 1.5–6.5)
RBC: 4.96 10*6/uL (ref 3.70–5.45)
RDW: 15.4 % — ABNORMAL HIGH (ref 11.2–14.5)
WBC: 6.4 10*3/uL (ref 3.9–10.3)

## 2009-05-17 LAB — CBC WITH DIFFERENTIAL/PLATELET
BASO%: 0.3 % (ref 0.0–2.0)
Basophils Absolute: 0 10*3/uL (ref 0.0–0.1)
EOS%: 3.2 % (ref 0.0–7.0)
HGB: 15.8 g/dL (ref 11.6–15.9)
MCH: 29.9 pg (ref 25.1–34.0)
RDW: 15.4 % — ABNORMAL HIGH (ref 11.2–14.5)
WBC: 5.9 10*3/uL (ref 3.9–10.3)
lymph#: 1.6 10*3/uL (ref 0.9–3.3)

## 2009-05-17 LAB — COMPREHENSIVE METABOLIC PANEL
ALT: 22 U/L (ref 0–35)
AST: 27 U/L (ref 0–37)
Albumin: 4.6 g/dL (ref 3.5–5.2)
BUN: 20 mg/dL (ref 6–23)
Calcium: 10.5 mg/dL (ref 8.4–10.5)
Chloride: 104 mEq/L (ref 96–112)
Potassium: 4.3 mEq/L (ref 3.5–5.3)

## 2009-05-22 LAB — CBC WITH DIFFERENTIAL/PLATELET
EOS%: 2.7 % (ref 0.0–7.0)
MCH: 29.2 pg (ref 25.1–34.0)
MCHC: 32.5 g/dL (ref 31.5–36.0)
MCV: 90 fL (ref 79.5–101.0)
MONO%: 17.4 % — ABNORMAL HIGH (ref 0.0–14.0)
RBC: 5.13 10*6/uL (ref 3.70–5.45)
RDW: 15.6 % — ABNORMAL HIGH (ref 11.2–14.5)

## 2009-06-06 ENCOUNTER — Ambulatory Visit: Payer: Self-pay | Admitting: Oncology

## 2009-07-03 LAB — CBC WITH DIFFERENTIAL/PLATELET
HCT: 45.6 % (ref 34.8–46.6)
MCH: 27.5 pg (ref 25.1–34.0)
MONO#: 1.5 10*3/uL — ABNORMAL HIGH (ref 0.1–0.9)
RDW: 14.7 % — ABNORMAL HIGH (ref 11.2–14.5)
WBC: 7.7 10*3/uL (ref 3.9–10.3)
lymph#: 1.7 10*3/uL (ref 0.9–3.3)

## 2009-07-27 ENCOUNTER — Ambulatory Visit: Payer: Self-pay | Admitting: Oncology

## 2009-08-24 ENCOUNTER — Ambulatory Visit: Payer: Self-pay | Admitting: Oncology

## 2009-08-28 LAB — CBC WITH DIFFERENTIAL/PLATELET
Basophils Absolute: 0.1 10*3/uL (ref 0.0–0.1)
Eosinophils Absolute: 0.2 10*3/uL (ref 0.0–0.5)
HCT: 44.8 % (ref 34.8–46.6)
LYMPH%: 21.4 % (ref 14.0–49.7)
MCH: 26.1 pg (ref 25.1–34.0)
MCHC: 31 g/dL — ABNORMAL LOW (ref 31.5–36.0)
MCV: 84.1 fL (ref 79.5–101.0)
MONO#: 2.1 10*3/uL — ABNORMAL HIGH (ref 0.1–0.9)
MONO%: 22.8 % — ABNORMAL HIGH (ref 0.0–14.0)
NEUT#: 4.7 10*3/uL (ref 1.5–6.5)
NEUT%: 52.5 % (ref 38.4–76.8)
RDW: 15.4 % — ABNORMAL HIGH (ref 11.2–14.5)
WBC: 9 10*3/uL (ref 3.9–10.3)
lymph#: 1.9 10*3/uL (ref 0.9–3.3)

## 2009-09-26 ENCOUNTER — Ambulatory Visit: Payer: Self-pay | Admitting: Oncology

## 2009-09-28 LAB — CBC WITH DIFFERENTIAL/PLATELET
Eosinophils Absolute: 0.3 10*3/uL (ref 0.0–0.5)
HGB: 12.7 g/dL (ref 11.6–15.9)
LYMPH%: 20.5 % (ref 14.0–49.7)
MCH: 25.9 pg (ref 25.1–34.0)
MCHC: 31.3 g/dL — ABNORMAL LOW (ref 31.5–36.0)
MCV: 82.7 fL (ref 79.5–101.0)
MONO#: 1.7 10*3/uL — ABNORMAL HIGH (ref 0.1–0.9)
RBC: 4.91 10*6/uL (ref 3.70–5.45)
RDW: 15.6 % — ABNORMAL HIGH (ref 11.2–14.5)
lymph#: 1.9 10*3/uL (ref 0.9–3.3)

## 2009-11-23 ENCOUNTER — Ambulatory Visit: Payer: Self-pay | Admitting: Oncology

## 2009-11-27 LAB — CBC WITH DIFFERENTIAL/PLATELET
Basophils Absolute: 0 10*3/uL (ref 0.0–0.1)
HCT: 38.7 % (ref 34.8–46.6)
MCH: 26.1 pg (ref 25.1–34.0)
NEUT#: 5.6 10*3/uL (ref 1.5–6.5)
Platelets: 377 10*3/uL (ref 145–400)
RDW: 16.2 % — ABNORMAL HIGH (ref 11.2–14.5)
lymph#: 1.6 10*3/uL (ref 0.9–3.3)

## 2010-01-02 ENCOUNTER — Encounter: Payer: Self-pay | Admitting: Internal Medicine

## 2010-02-01 ENCOUNTER — Ambulatory Visit: Payer: Self-pay | Admitting: Oncology

## 2010-02-05 LAB — BASIC METABOLIC PANEL
CO2: 28 mEq/L (ref 19–32)
Creatinine, Ser: 0.88 mg/dL (ref 0.40–1.20)
Sodium: 141 mEq/L (ref 135–145)

## 2010-02-05 LAB — CBC WITH DIFFERENTIAL/PLATELET
Basophils Absolute: 0 10*3/uL (ref 0.0–0.1)
EOS%: 3.6 % (ref 0.0–7.0)
Eosinophils Absolute: 0.3 10*3/uL (ref 0.0–0.5)
MCH: 25.9 pg (ref 25.1–34.0)
MCHC: 32.2 g/dL (ref 31.5–36.0)
MCV: 80.5 fL (ref 79.5–101.0)
MONO#: 1.1 10*3/uL — ABNORMAL HIGH (ref 0.1–0.9)
MONO%: 14 % (ref 0.0–14.0)
NEUT#: 5.1 10*3/uL (ref 1.5–6.5)
Platelets: 272 10*3/uL (ref 145–400)
RBC: 4.95 10*6/uL (ref 3.70–5.45)

## 2010-04-03 ENCOUNTER — Ambulatory Visit: Payer: Self-pay | Admitting: Oncology

## 2010-04-05 LAB — CBC WITH DIFFERENTIAL/PLATELET
BASO%: 0.9 % (ref 0.0–2.0)
MCHC: 31.5 g/dL (ref 31.5–36.0)
MCV: 75.9 fL — ABNORMAL LOW (ref 79.5–101.0)
NEUT#: 5.9 10*3/uL (ref 1.5–6.5)
NEUT%: 63 % (ref 38.4–76.8)
Platelets: 582 10*3/uL — ABNORMAL HIGH (ref 145–400)
RBC: 5.98 10*6/uL — ABNORMAL HIGH (ref 3.70–5.45)
RDW: 15.1 % — ABNORMAL HIGH (ref 11.2–14.5)
lymph#: 1.9 10*3/uL (ref 0.9–3.3)

## 2010-04-14 ENCOUNTER — Encounter: Payer: Self-pay | Admitting: Internal Medicine

## 2010-04-15 ENCOUNTER — Ambulatory Visit: Payer: Self-pay | Admitting: Internal Medicine

## 2010-04-27 ENCOUNTER — Encounter: Payer: Self-pay | Admitting: Internal Medicine

## 2010-04-29 ENCOUNTER — Encounter: Payer: Self-pay | Admitting: Internal Medicine

## 2010-05-03 ENCOUNTER — Ambulatory Visit: Payer: Self-pay | Admitting: Oncology

## 2010-05-03 LAB — CBC WITH DIFFERENTIAL/PLATELET
HCT: 44.4 % (ref 34.8–46.6)
MCH: 24 pg — ABNORMAL LOW (ref 25.1–34.0)
MCHC: 31.8 g/dL (ref 31.5–36.0)
Platelets: 198 10*3/uL (ref 145–400)
RDW: 17.3 % — ABNORMAL HIGH (ref 11.2–14.5)
WBC: 6.8 10*3/uL (ref 3.9–10.3)

## 2010-05-03 LAB — BASIC METABOLIC PANEL: Glucose, Bld: 97 mg/dL (ref 70–99)

## 2010-05-14 LAB — CBC WITH DIFFERENTIAL/PLATELET
BASO%: 0.7 % (ref 0.0–2.0)
HGB: 14.2 g/dL (ref 11.6–15.9)
LYMPH%: 20.1 % (ref 14.0–49.7)
MCH: 23.7 pg — ABNORMAL LOW (ref 25.1–34.0)
MONO#: 1.2 10*3/uL — ABNORMAL HIGH (ref 0.1–0.9)
RBC: 6 10*6/uL — ABNORMAL HIGH (ref 3.70–5.45)
RDW: 18.3 % — ABNORMAL HIGH (ref 11.2–14.5)
lymph#: 1.9 10*3/uL (ref 0.9–3.3)

## 2010-07-01 ENCOUNTER — Ambulatory Visit: Payer: Self-pay | Admitting: Hematology and Oncology

## 2010-07-03 LAB — BASIC METABOLIC PANEL
BUN: 23 mg/dL (ref 6–23)
CO2: 28 mEq/L (ref 19–32)
Calcium: 10.6 mg/dL — ABNORMAL HIGH (ref 8.4–10.5)
Chloride: 104 mEq/L (ref 96–112)
Creatinine, Ser: 0.8 mg/dL (ref 0.40–1.20)
Glucose, Bld: 91 mg/dL (ref 70–99)
Potassium: 4.9 mEq/L (ref 3.5–5.3)
Sodium: 142 mEq/L (ref 135–145)

## 2010-07-03 LAB — CBC WITH DIFFERENTIAL/PLATELET
BASO%: 1.2 % (ref 0.0–2.0)
Basophils Absolute: 0.1 10*3/uL (ref 0.0–0.1)
EOS%: 4.2 % (ref 0.0–7.0)
Eosinophils Absolute: 0.4 10*3/uL (ref 0.0–0.5)
HCT: 47 % — ABNORMAL HIGH (ref 34.8–46.6)
HGB: 15 g/dL (ref 11.6–15.9)
LYMPH%: 19.4 % (ref 14.0–49.7)
MCH: 24.5 pg — ABNORMAL LOW (ref 25.1–34.0)
MCHC: 31.9 g/dL (ref 31.5–36.0)
MCV: 76.7 fL — ABNORMAL LOW (ref 79.5–101.0)
MONO#: 1.4 10*3/uL — ABNORMAL HIGH (ref 0.1–0.9)
MONO%: 14.6 % — ABNORMAL HIGH (ref 0.0–14.0)
NEUT#: 5.7 10*3/uL (ref 1.5–6.5)
NEUT%: 60.6 % (ref 38.4–76.8)
Platelets: 370 10*3/uL (ref 145–400)
RBC: 6.13 10*6/uL — ABNORMAL HIGH (ref 3.70–5.45)
RDW: 21.6 % — ABNORMAL HIGH (ref 11.2–14.5)
WBC: 9.5 10*3/uL (ref 3.9–10.3)
lymph#: 1.8 10*3/uL (ref 0.9–3.3)

## 2010-07-12 ENCOUNTER — Ambulatory Visit
Admission: RE | Admit: 2010-07-12 | Discharge: 2010-07-12 | Payer: Self-pay | Source: Home / Self Care | Attending: Internal Medicine | Admitting: Internal Medicine

## 2010-07-12 DIAGNOSIS — I10 Essential (primary) hypertension: Secondary | ICD-10-CM | POA: Insufficient documentation

## 2010-07-12 DIAGNOSIS — Z950 Presence of cardiac pacemaker: Secondary | ICD-10-CM | POA: Insufficient documentation

## 2010-07-12 DIAGNOSIS — R0602 Shortness of breath: Secondary | ICD-10-CM | POA: Insufficient documentation

## 2010-07-18 NOTE — Miscellaneous (Signed)
Summary: Device preload  Clinical Lists Changes  Observations: Added new observation of PPM INDICATN: Mobitz II (04/27/2010 12:28) Added new observation of MAGNET RTE: BOL 85 ERI 65 (04/27/2010 12:28) Added new observation of PPMLEADSTAT2: active (04/27/2010 12:28) Added new observation of PPMLEADSER2: [id number] (04/27/2010 12:28) Added new observation of PPMLEADMOD2: 5076  (04/27/2010 12:28) Added new observation of PPMLEADDOI2: 03/28/2009  (04/27/2010 12:28) Added new observation of PPMLEADLOC2: RV  (04/27/2010 12:28) Added new observation of PPMLEADSTAT1: active  (04/27/2010 12:28) Added new observation of PPMLEADSER1: [id number]  (04/27/2010 12:28) Added new observation of PPMLEADMOD1: 4469  (04/27/2010 12:28) Added new observation of PPMLEADDOI1: 11/29/2001  (04/27/2010 12:28) Added new observation of PPMLEADLOC1: RA  (04/27/2010 12:28) Added new observation of PPM DOI: 03/26/2009  (04/27/2010 12:28) Added new observation of PPM SERL#: [id number]  (04/27/2010 12:28) Added new observation of PPM MODL#: [id number]  (04/27/2010 84:13) Added new observation of PACEMAKERMFG: Medtronic  (04/27/2010 12:28) Added new observation of PPM IMP MD: Duffy Rhody Tennant,MD  (04/27/2010 12:28) Added new observation of PPM REFER MD: Rolla Plate  (04/27/2010 12:28) Added new observation of PACEMAKER MD: Hillis Range, MD  (04/27/2010 12:28)      PPM Specifications Following MD:  Hillis Range, MD     Referring MD:  Rolla Plate PPM Vendor:  Medtronic     PPM Model Number:  [id number]     PPM Serial Number:  [id number] PPM DOI:  03/26/2009     PPM Implanting MD:  Rolla Plate  Lead 1    Location: RA     DOI: 11/29/2001     Model #: [id number]     Serial #: [id number]     Status: active Lead 2    Location: RV     DOI: 03/28/2009     Model #: [id number]     Serial #: [id number]     Status: active  Magnet Response Rate:  BOL 85 ERI 65  Indications:  Mobitz II

## 2010-07-18 NOTE — Cardiovascular Report (Signed)
Summary: Office Visit Remote   Office Visit Remote   Imported By: Roderic Ovens 04/29/2010 17:02:09  _____________________________________________________________________  External Attachment:    Type:   Image     Comment:   External Document

## 2010-07-18 NOTE — Assessment & Plan Note (Signed)
Summary: pacer check/medtronic   Visit Type:  Pacemaker check Primary Provider:  Dr Laurann Montana   History of Present Illness: [name] is a pleasant 77 yo WF with a  h/o mobitz II AV block s/p intial PPM 2003 with subsequent RV lead revision x 2 10/10 by Dr Deborah Chalk who presents today to establish care in the EP clinic.  She reports doing very well recently.  She remains active and walks 3 miles per day. She denies symptoms of palpitations, chest pain, shortness of breath, orthopnea, PND, lower extremity edema,presyncope, syncope, or neurologic sequela. The patient is tolerating medications without difficulties and is otherwise without complaint today.   Current Medications (verified): 1)  Aspirin Ec 325 Mg Tbec (Aspirin) .... Take 2 Tablets By Mouth Daily 2)  Oscal 500/200 D-3 500-200 Mg-Unit Tabs (Calcium Carbonate-Vitamin D) .... Two Times A Day 3)  Vitamin B-12 500 Mcg  Tabs (Cyanocobalamin) .... Once Daily 4)  Vitamin B-6 100 Mg Tabs (Pyridoxine Hcl) .... Once Daily 5)  Melatonin 3 Mg Tabs (Melatonin) .... At Bedtime 6)  Simvastatin 10 Mg Tabs (Simvastatin) .... Take One Tablet By Mouth Daily At Bedtime 7)  Amlodipine Besylate 5 Mg Tabs (Amlodipine Besylate) .... Take One Tablet By Mouth Daily 8)  Lotemax 0.5 % Susp (Loteprednol Etabonate) .... Three Times A Day 9)  Hydrea 500 Mg Caps (Hydroxyurea) .... Once Daily  Allergies (verified): No Known Drug Allergies  Past History:  Past Medical History: Dyspnea Hypertension Polycythemia Mobitz II AV block s/ PPM with RV lead revision x 2 10/10  Past Surgical History: PPM 6/03 with RV lead revision x 2 by Dr Deborah Chalk 10/10  Social History: Reviewed history from 07/12/2010 and no changes required. Single  Tobacco Use - No.  Alcohol Use - no Regular Exercise - yes  Review of Systems       All systems are reviewed and negative except as listed in the HPI.   Vital Signs:  Patient profile:   77 year old female Height:       63 inches Weight:      125 pounds BMI:     22.22 BP sitting:   146 / 66  (right arm) Cuff size:   regular  Vitals Entered By: Hardin Negus, RMA (July 12, 2010 10:40 AM)  Physical Exam  General:  thin elderly female, NAD Head:  normocephalic and atraumatic Eyes:  PERRLA/EOM intact; conjunctiva and lids normal. Mouth:  Teeth, gums and palate normal. Oral mucosa normal. Neck:  supple Chest Wall:  pacemaker pocket is well healed Lungs:  Clear bilaterally to auscultation and percussion. Heart:  Non-displaced PMI, chest non-tender; regular rate and rhythm, S1, S2 without murmurs, rubs or gallops. Carotid upstroke normal, no bruit. Normal abdominal aortic size, no bruits. Femorals normal pulses, no bruits. Pedals normal pulses. No edema, no varicosities. Abdomen:  Bowel sounds positive; abdomen soft and non-tender without masses, organomegaly, or hernias noted. No hepatosplenomegaly. Msk:  Back normal, normal gait. Muscle strength and tone normal. Extremities:  No clubbing or cyanosis. Neurologic:  Alert and oriented x 3.   PPM Specifications Following MD:  Hillis Range, MD     Referring MD:  Rolla Plate PPM Vendor:  Medtronic     PPM Model Number:  [id number]     PPM Serial Number:  [id number] [facility] PPM DOI:  03/26/2009     PPM Implanting MD:  Duffy Rhody Tennant,MD  Lead 1    Location: RA     DOI: 11/29/2001  Model #: [id number]     Serial #: [id number]     Status: active Lead 2    Location: RV     DOI: 03/28/2009     Model #: [id number]     Serial #: [id number]     Status: active  Magnet Response Rate:  BOL 85 ERI 65  Indications:  Mobitz II   PPM Follow Up Battery Voltage:  2.79 V     Battery Est. Longevity:  9 YRS       PPM Device Measurements Atrium  Amplitude: 5.60 mV, Impedance: 470 ohms, Threshold: 0.750 V at 0.40 msec Right Ventricle  Amplitude: 15.68 mV, Impedance: 616 ohms, Threshold: 0.50 V at 0.40 msec  Episodes Karen Episodes:  1     Percent Mode Switch:  <0.1%     Ventricular  High Rate:  4     Atrial Pacing:  42.2%     Ventricular Pacing:  99.8%  Parameters Mode:  DDDR     Lower Rate Limit:  60     Upper Rate Limit:  120 Paced AV Delay:  200     Sensed AV Delay:  180 Next Remote Date:  10/10/2010     Next Cardiology Appt Due:  06/17/2011 Tech Comments:  1 MODE SWITCH LASTING LESS THAN 1 MINUTE.  4 VHR EPISODES--LONGEST WAS 8 SECONDS.  NORMAL DEVICE FUNCTION.  NO CHANGES MADE. CARELINK 10-10-10 AND ROV IN 12 MTHS W/JA. Vella Kohler  July 12, 2010 11:08 AM MD Comments:  agree  Impression & Recommendations:  Problem # 1:  PACEMAKER (ICD-V45.01) Mobitz II AV block normal pacemaker function as above  Problem # 2:  HYPERTENSION, UNSPECIFIED (ICD-401.9) stable salt restriction  Patient Instructions: 1)  Your physician wants you to follow-up in:  12 months with Dr Jacquiline Doe will receive a reminder letter in the mail two months in advance. If you don't receive a letter, please call our office to schedule the follow-up appointment. 2)  Your physician recommends that you continue on your current medications as directed. Please refer to the Current Medication list given to you today. 3)  [facility] 10/10/10

## 2010-07-18 NOTE — Letter (Signed)
Summary: Remote Device Check  [facility], Main Office  [address]   [place], [place] [address]   Phone: [phone]  Fax: [phone]     April 29, 2010 MRN: [id number]   [name] [address] [place], [place]  [address]   Dear [name],   Your remote transmission was recieved and reviewed by your physician.  All diagnostics were within normal limits for you.  ___X___Your next office visit is scheduled for:  07-12-2010 @ 1050 with Dr Johney Frame.                                Sincerely,  Vella Kohler

## 2010-08-01 NOTE — Letter (Signed)
Summary: [facility] Progress Note   [facility] Progress Note   Imported By: Roderic Ovens 07/26/2010 15:17:24  _____________________________________________________________________  External Attachment:    Type:   Image     Comment:   External Document

## 2010-09-19 LAB — URINALYSIS, ROUTINE W REFLEX MICROSCOPIC
Glucose, UA: NEGATIVE mg/dL
Leukocytes, UA: NEGATIVE
Specific Gravity, Urine: >1.046 — ABNORMAL HIGH (ref 1.005–1.030)
pH: 6 (ref 5.0–8.0)

## 2010-09-19 LAB — COMPREHENSIVE METABOLIC PANEL
AST: 29 U/L (ref 0–37)
Albumin: 4 g/dL (ref 3.5–5.2)
Alkaline Phosphatase: 93 U/L (ref 39–117)
CO2: 27 mEq/L (ref 19–32)
Chloride: 102 mEq/L (ref 96–112)
Creatinine, Ser: 0.6 mg/dL (ref 0.4–1.2)
GFR calc Af Amer: >60 mL/min (ref >60)
GFR calc non Af Amer: >60 mL/min (ref >60)
Potassium: 3.4 mEq/L — ABNORMAL LOW (ref 3.5–5.1)
Total Bilirubin: 0.8 mg/dL (ref 0.3–1.2)

## 2010-09-19 LAB — URINE MICROSCOPIC-ADD ON

## 2010-09-19 LAB — CARDIAC PANEL(CRET KIN+CKTOT+MB+TROPI)
CK, MB: 3.7 ng/mL (ref 0.3–4.0)
CK, MB: 4 ng/mL (ref 0.3–4.0)
Relative Index: INVALID (ref 0.0–2.5)
Relative Index: INVALID (ref 0.0–2.5)
Total CK: 24 U/L (ref 7–177)

## 2010-09-19 LAB — URINE CULTURE: Special Requests: NEGATIVE

## 2010-09-19 LAB — BASIC METABOLIC PANEL
BUN: 17 mg/dL (ref 6–23)
CO2: 28 mEq/L (ref 19–32)
Calcium: 10.2 mg/dL (ref 8.4–10.5)
Chloride: 103 mEq/L (ref 96–112)
Creatinine, Ser: 0.79 mg/dL (ref 0.4–1.2)
GFR calc Af Amer: >60 mL/min (ref >60)
GFR calc Af Amer: >60 mL/min (ref >60)
GFR calc non Af Amer: >60 mL/min (ref >60)
Glucose, Bld: 105 mg/dL — ABNORMAL HIGH (ref 70–99)
Glucose, Bld: 102 mg/dL — ABNORMAL HIGH (ref 70–99)
Potassium: 4.1 mEq/L (ref 3.5–5.1)

## 2010-09-19 LAB — CULTURE, BLOOD (ROUTINE X 2)

## 2010-09-19 LAB — CBC
HCT: 48.5 % — ABNORMAL HIGH (ref 36.0–46.0)
Hemoglobin: 15.5 g/dL — ABNORMAL HIGH (ref 12.0–15.0)
MCV: 91.1 fL (ref 78.0–100.0)
Platelets: 570 10*3/uL — ABNORMAL HIGH (ref 150–400)
RBC: 5.33 MIL/uL — ABNORMAL HIGH (ref 3.87–5.11)
WBC: 6.4 10*3/uL (ref 4.0–10.5)

## 2010-09-19 LAB — APTT: aPTT: 36 seconds (ref 24–37)

## 2010-09-20 LAB — CBC
HCT: 46.1 % — ABNORMAL HIGH (ref 36.0–46.0)
HCT: 48 % — ABNORMAL HIGH (ref 36.0–46.0)
Hemoglobin: 15.9 g/dL — ABNORMAL HIGH (ref 12.0–15.0)
Hemoglobin: 16 g/dL — ABNORMAL HIGH (ref 12.0–15.0)
Hemoglobin: 16.3 g/dL — ABNORMAL HIGH (ref 12.0–15.0)
MCHC: 33.1 g/dL (ref 30.0–36.0)
MCHC: 33.3 g/dL (ref 30.0–36.0)
MCHC: 33.6 g/dL (ref 30.0–36.0)
MCHC: 33.7 g/dL (ref 30.0–36.0)
MCV: 89.9 fL (ref 78.0–100.0)
MCV: 91 fL (ref 78.0–100.0)
Platelets: 246 10*3/uL (ref 150–400)
Platelets: 249 10*3/uL (ref 150–400)
Platelets: 280 10*3/uL (ref 150–400)
RBC: 5.13 MIL/uL — ABNORMAL HIGH (ref 3.87–5.11)
RBC: 5.26 MIL/uL — ABNORMAL HIGH (ref 3.87–5.11)
RBC: 5.27 MIL/uL — ABNORMAL HIGH (ref 3.87–5.11)
RBC: 5.29 MIL/uL — ABNORMAL HIGH (ref 3.87–5.11)
RBC: 5.31 MIL/uL — ABNORMAL HIGH (ref 3.87–5.11)
RDW: 15.2 % (ref 11.5–15.5)
RDW: 15.4 % (ref 11.5–15.5)
WBC: 5.5 10*3/uL (ref 4.0–10.5)
WBC: 6.4 10*3/uL (ref 4.0–10.5)
WBC: 8.2 10*3/uL (ref 4.0–10.5)
WBC: 8.2 10*3/uL (ref 4.0–10.5)

## 2010-09-20 LAB — PROTIME-INR
INR: 1 (ref 0.00–1.49)
Prothrombin Time: 13 seconds (ref 11.6–15.2)

## 2010-09-20 LAB — BASIC METABOLIC PANEL
BUN: 11 mg/dL (ref 6–23)
CO2: 24 mEq/L (ref 19–32)
Chloride: 96 mEq/L (ref 96–112)
Glucose, Bld: 97 mg/dL (ref 70–99)
Potassium: 3.8 mEq/L (ref 3.5–5.1)
Potassium: 4.1 mEq/L (ref 3.5–5.1)
Sodium: 130 mEq/L — ABNORMAL LOW (ref 135–145)

## 2010-09-20 LAB — POCT I-STAT, CHEM 8
Chloride: 102 mEq/L (ref 96–112)
HCT: 52 % — ABNORMAL HIGH (ref 36.0–46.0)
Hemoglobin: 17.7 g/dL — ABNORMAL HIGH (ref 12.0–15.0)
Potassium: 4.3 mEq/L (ref 3.5–5.1)
Sodium: 136 mEq/L (ref 135–145)

## 2010-09-20 LAB — URINE MICROSCOPIC-ADD ON

## 2010-09-20 LAB — APTT: aPTT: 32 seconds (ref 24–37)

## 2010-09-20 LAB — POCT CARDIAC MARKERS
CKMB, poc: 1.3 ng/mL (ref 1.0–8.0)
Myoglobin, poc: 94.6 ng/mL (ref 12–200)
Troponin i, poc: <0.05 ng/mL (ref 0.00–0.09)

## 2010-09-20 LAB — DIFFERENTIAL
Basophils Absolute: 0 10*3/uL (ref 0.0–0.1)
Basophils Relative: 0 % (ref 0–1)
Eosinophils Absolute: 0.2 10*3/uL (ref 0.0–0.7)
Lymphocytes Relative: 31 % (ref 12–46)
Monocytes Relative: 28 % — ABNORMAL HIGH (ref 3–12)
Neutrophils Relative %: 37 % — ABNORMAL LOW (ref 43–77)

## 2010-09-20 LAB — URINALYSIS, ROUTINE W REFLEX MICROSCOPIC
Bilirubin Urine: NEGATIVE
Glucose, UA: NEGATIVE mg/dL
Ketones, ur: NEGATIVE mg/dL
Nitrite: NEGATIVE
Specific Gravity, Urine: 1.016 (ref 1.005–1.030)
pH: 5.5 (ref 5.0–8.0)

## 2010-09-20 LAB — COMPREHENSIVE METABOLIC PANEL
BUN: 15 mg/dL (ref 6–23)
Calcium: 10 mg/dL (ref 8.4–10.5)
Creatinine, Ser: 0.66 mg/dL (ref 0.4–1.2)
Glucose, Bld: 101 mg/dL — ABNORMAL HIGH (ref 70–99)
Total Protein: 7.9 g/dL (ref 6.0–8.3)

## 2010-09-20 LAB — SEDIMENTATION RATE: Sed Rate: 3 mm/hr (ref 0–22)

## 2010-09-20 LAB — LIPID PANEL
Triglycerides: 86 mg/dL (ref <150)
VLDL: 17 mg/dL (ref 0–40)

## 2010-09-20 LAB — T4: T4, Total: 7.6 ug/dL (ref 5.0–12.5)

## 2010-09-26 ENCOUNTER — Other Ambulatory Visit: Payer: Self-pay | Admitting: Hematology and Oncology

## 2010-09-26 ENCOUNTER — Encounter (HOSPITAL_BASED_OUTPATIENT_CLINIC_OR_DEPARTMENT_OTHER): Payer: Medicare Other | Admitting: Hematology and Oncology

## 2010-09-26 DIAGNOSIS — D45 Polycythemia vera: Secondary | ICD-10-CM

## 2010-09-26 LAB — CBC WITH DIFFERENTIAL/PLATELET
BASO%: 0.2 % (ref 0.0–2.0)
Eosinophils Absolute: 0.3 10*3/uL (ref 0.0–0.5)
HCT: 46.8 % — ABNORMAL HIGH (ref 34.8–46.6)
LYMPH%: 17.9 % (ref 14.0–49.7)
MCHC: 32.3 g/dL (ref 31.5–36.0)
MCV: 81.9 fL (ref 79.5–101.0)
MONO#: 1.3 10*3/uL — ABNORMAL HIGH (ref 0.1–0.9)
MONO%: 13.6 % (ref 0.0–14.0)
NEUT%: 65 % (ref 38.4–76.8)
Platelets: 359 10*3/uL (ref 145–400)
RBC: 5.71 10*6/uL — ABNORMAL HIGH (ref 3.70–5.45)
WBC: 9.2 10*3/uL (ref 3.9–10.3)

## 2010-09-26 LAB — BASIC METABOLIC PANEL
CO2: 28 mEq/L (ref 19–32)
Calcium: 9.9 mg/dL (ref 8.4–10.5)
Creatinine, Ser: 0.84 mg/dL (ref 0.40–1.20)
Glucose, Bld: 120 mg/dL — ABNORMAL HIGH (ref 70–99)

## 2010-10-10 ENCOUNTER — Other Ambulatory Visit: Payer: Self-pay | Admitting: Internal Medicine

## 2010-10-10 ENCOUNTER — Ambulatory Visit (INDEPENDENT_AMBULATORY_CARE_PROVIDER_SITE_OTHER): Payer: Medicare Other | Admitting: *Deleted

## 2010-10-10 DIAGNOSIS — I441 Atrioventricular block, second degree: Secondary | ICD-10-CM

## 2010-10-10 DIAGNOSIS — Z950 Presence of cardiac pacemaker: Secondary | ICD-10-CM

## 2010-10-20 NOTE — Progress Notes (Signed)
Pacer remote check  

## 2010-10-23 ENCOUNTER — Encounter: Payer: Self-pay | Admitting: *Deleted

## 2010-10-29 NOTE — Discharge Summary (Signed)
[name], [name]              ACCOUNT NO.:  [id number]   MEDICAL RECORD NO.:  [id number]          PATIENT TYPE:  OBV   LOCATION:  1417                         FACILITY:  [facility]   PHYSICIAN:  Corinna L. Lendell Caprice, MDDATE OF BIRTH:  1923/06/14   DATE OF ADMISSION:  12/22/2007  DATE OF DISCHARGE:  12/23/2007                               DISCHARGE SUMMARY   DISCHARGE DIAGNOSES:  1. Transient ischemic attack.  2. History of pacemaker placement.  3. Polycythemia.  4. Elevated blood pressure, suspect hypertension.   DISCHARGE MEDICATIONS:  I have recommended that she stop aspirin and  take Aggrenox 1 p.o. b.i.d.  She reports that she will think about it  but if she decides not to fill the Aggrenox, I recommend her aspirin be  increased to 325 mg a day as also recommended Zocor 10 mg a day, which  she will think about and hydrochlorothiazide 25 mg a day, which she will  think about .  She may continue rest of her outpatient medications,  which include hydroxyurea of 500 mg every other day, vitamin B6, B12,  and calcium with vitamin D.   CONDITION:  Stable.   ACTIVITY:  Ad lib.   Diet should be low salt, low cholesterol.  Follow up with Dr. Laurann Montana next week.  At which time, an echocardiogram result should be back  and needs to be followed up and blood pressure should be rechecked.   CONDITION:  Stable.   CONSULTATIONS:  None.   PROCEDURES:  None.   LABS:  CBC unremarkable.  Basic metabolic panel unremarkable.  LDL is  101, HDL is 29, triglycerides 147, total cholesterol 159,  and  homocystine 11.  Special studies in radiology, CT of the brain showed  question area of low density in the left parietal region that could  represent an acute infarction.  This is not definite.  No sinus  swelling, mass, or hemorrhage.  EKG showed AV sequential pacing.  Echocardiogram is pending.  Preliminary carotid Doppler results showed  mild plaque throughout.  No ICA stenosis.   Vertebral arteries antegrade  bilaterally.   HISTORY AND HOSPITAL COURSE:  [name] is an 77 year old white  female who had about 30 minutes of expressive aphasia while at her  hematologist's office.  She was therefore sent to the emergency room.  By the time she was seen in the emergency room, her speech had returned  to normal.  She had no other symptoms.  She had normal neurologic  examination.  She had been on an aspirin a day 81 mg and also had been  on hydroxyurea and had normal CBC done in Dr. Lonell Face office.  The  patient was placed on a 23-hour observation and had no return of her  symptoms.  She did get serial neurologic checks.  Because of her  pacemaker, we did not do an MRI or MRA.  Due to her LDL being above 70,  I recommended she start a statin, but she has refused.  I also recommend  switching from aspirin to Aggrenox, which she refuses.  I also  recommended starting on an antihypertensive, which she has at this time  refused.  I have given prescriptions and she said that she will think  about it.  Her blood pressure was initially quite high in the emergency  room up to 224/98 and ranged 140-175 over about 70 in the hospital and  she has no known history of hypertension but when asked for the blood  pressure usually runs, she is quite evasive.  She voices understanding  that if her blood pressure and cholesterol is not optimally controlled  and she is not on the proper antiplatelet agent, she could have another  stroke.  She agrees to present back to the emergency room if she has any  recurrence of symptoms or new neurologic deficits.  I also discussed my  recommendations with the daughter.  The echocardiogram has been done but  it is at this time pending.  Condition is stable.      Corinna L. Lendell Caprice, MD  Electronically Signed     CLS/MEDQ  D:  12/23/2007  T:  12/24/2007  Job:  [id number]   cc:   Vicente Serene I. Odogwu, M.D.  Stacie Acres Cliffton Asters, M.D.

## 2010-10-29 NOTE — H&P (Signed)
NAMECHERYLIN, WAGUESPACK              ACCOUNT NO.:  [id number]   MEDICAL RECORD NO.:  [id number]          PATIENT TYPE:  EMS   LOCATION:  ED                           FACILITY:  [facility]   PHYSICIAN:  Corinna L. Lendell Caprice, MDDATE OF BIRTH:  1922/09/20   DATE OF ADMISSION:  12/22/2007  DATE OF DISCHARGE:                              HISTORY & PHYSICAL   CHIEF COMPLAINT:  Difficulty talking.   HISTORY OF PRESENT ILLNESS:  [name] is an 77 year old white female  who was at Dr. Lonell Face office for followup of her polycythemia.  She  had sudden onset expressive aphasia and was therefore sent to the  emergency room. The episode lasted about 30 minutes and has completely  resolved.  She had no accompanying deficits.  She does report a left  neck and occipital headache since yesterday which is rare for her.  She  has never had a stroke before.   PAST MEDICAL HISTORY:  Polycythemia, history of pacemaker for a second  degree AV block.   MEDICATIONS:  1. Aspirin 81 mg a day.  2. Hydroxyurea 500 mg every other day.  3. Vitamin B12.  4. Vitamin B6.  5. Calcium with vitamin D.  6. Some type of eye drops for dry eyes.   SOCIAL HISTORY:  The patient is widowed.  She is here with her daughter.  She does not drink or smoke.   FAMILY HISTORY:  Is noncontributory.   REVIEW OF SYSTEMS:  As above otherwise negative.   PHYSICAL EXAMINATION:  VITAL SIGNS:  Temperature is 98.3, blood pressure  initially 224/98, currently 159/87, pulse 63, respiratory rate 18,  oxygen saturation 97% on room air.  GENERAL:  The patient is an elderly white female in no acute distress.  HEENT:  Normocephalic, atraumatic.  Pupils equal, round, reactive to  light.  Sclerae nonicteric.  Moist mucous membranes.  NECK:  Neck is supple.  No carotid bruits.  LUNGS:  Lungs are clear to auscultation bilaterally without wheezes,  rhonchi or rales.  CARDIOVASCULAR:  Regular rate and rhythm without murmurs, gallops or  rubs.  ABDOMEN:  Normal bowel sounds, soft, nontender, nondistended.  GU:  Deferred.  RECTAL:  Deferred.  EXTREMITIES:  No clubbing, cyanosis or edema.  SKIN:  No rash.  PSYCHIATRIC:  Normal affect.  NEUROLOGIC:  Cranial nerves are intact.  The patient is alert and  oriented.  Motor strength is 5/5.  Deep tendon reflexes 2+ and  symmetric.  Gait is normal.  Romberg negative.  No pronator drift.  Finger-to-nose is normal.   LABS:  She had a CBC done today which was unremarkable.  EKG shows AV  sequential pacing.  CT of the brain shows a questionable area of low  density in the left parietal region which could be acute infarct.  No  sinus swelling, mass effect or hemorrhage.   ASSESSMENT AND PLAN:  1. TIA (transient ischemic attack).  The patient will be placed on 23      hour observation.  I will get carotid Dopplers and echocardiogram.      She is unable to get an  MRI due to the pacemaker.  I will increase      her aspirin to 325 mg a day and check fasting lipids and      homocysteine level.  2. Polycythemia.      Corinna L. Lendell Caprice, MD  Electronically Signed     CLS/MEDQ  D:  12/22/2007  T:  12/22/2007  Job:  [id number]   cc:   Stacie Acres. Cliffton Asters, M.D.  Fax: [phone]   Lauretta I. Odogwu, M.D.  Fax: [phone]

## 2010-11-01 NOTE — Op Note (Signed)
[place]  Patient:    [name], [name]                     MRN: [id number] Adm. Date:  [id number] Disc. Date: [id number] Attending:  Tommy Medal CC:         Stacie Acres. Cliffton Asters, M.D.   Operative Report  PREOPERATIVE DIAGNOSIS:  Severe blepharochalosis with visual impairment.  POSTOPERATIVE DIAGNOSIS:  Severe blepharochalosis with visual impairment.  PROCEDURE:  Upper eyelid operative blepharoplasty.  SURGEON:  Robert L. Dione Booze, M.D.  ANESTHESIA:  1% Xylocaine with epinephrine.  INDICATIONS AND JUSTIFICATION FOR THE PROCEDURE:  [name] was first seen in my office in July 1998, and vision was corrected to 20/25 on the right eye and 20/30 in the left.  She had definite blepharitis and also had blepharochalosis and had some cataracts at that time.  She has been followed yearly and was corrected to 20/25 in October 2000.  On June 17, 2000, she was corrected to 20/30 and still had the cataracts.  Pressures were 16 in the right and 15 in the left.  The dilated fundus exam was negative.  Externally she had worsening blepharochalosis and did feel that she had difficulty with the superior field of vision.  She could feel the weight of the lids and also had some irritation.  A photograph was taken, and she had visual field testing to show the reduction of the superior field caused by the extra skin.  The field test does show approximately a 15 degree reduction caused by the skin in the left eye and shows around 20 degrees in the right eye.  She felt she wanted to have upper eyelid operative blepharoplasty.  Medically she should be stable with respect to having this done, but she does plan to stop taking her aspirin about 10 days ahead of time.  She is followed medically by Dr. Laurann Montana.  JUSTIFICATION TO PERFORM THE PROCEDURE IN AN OUTPATIENT SETTING:  Routine.  JUSTIFICATION FOR OVERNIGHT STAY:  None.  DESCRIPTION OF PROCEDURE:   The patient arrived in the minor surgery room at [facility], and her face was prepped and draped in the routine fashion.  A frontal nerve block was given to each side, and the skin to be excised was carefully demarcated and then excised.  Bleeding was controlled with pressure.  Some underlying fatty tissue was also excised.  Each wound was closed with a running 6-0 Vicryl suture, and pressure patches were applied. The patient then left the minor room, having done well.  FOLLOW-UP CARE:  The patient is to remove the patches in several hours and is to be seen in my office in five or six days to have the sutures removed.  She is to use Polysporin ointment in her eyes at bedtime. DD:  09/08/00 TD:  09/09/00 Job: [id number]

## 2010-11-01 NOTE — Cardiovascular Report (Signed)
[place]. [facility]  Patient:    [name], [name] Visit Number: [id number] MRN: [id number]          Service Type: MED Location: [phone] Attending Physician:  Eleanora Neighbor Dictated by:   Colleen Can. Deborah Chalk, M.D. Proc. Date: 12/01/01 Admit Date:  11/26/2001 Discharge Date: 12/01/2001                          Cardiac Catheterization  HISTORY: The patient is a 77 year old female who presented with second-degree atrioventricular block and intermittent chest pain over the last two weeks. She had a permanent pacemaker placed and is referred now for evaluation of coronary anatomy.  PROCEDURE: Left heart catheterization with selective coronary angiography, left ventricular angiography.  TYPE AND SITE OF ENTRY: Percutaneous right femoral artery (attempted Perclose but not satisfactory from the standpoint of back flow and subsequently Perclose aborted with deploying sutures).  CONTRAST MATERIAL: Omnipaque.  MEDICATIONS GIVEN PRIOR TO THE PROCEDURE: Valium 10 mg p.o.  MEDICATIONS GIVEN DURING THE PROCEDURE: Versed 2 mg IV.  COMMENTS: The patient tolerated the procedure well.  HEMODYNAMIC DATA: The LV pressure was 144/7, aortic pressure was 143/88. There is no aortic valve gradient noted on pullback.  ANGIOGRAPHIC DATA:  LEFT VENTRICULOGRAPHY: Left ventricular angiogram was performed in the RAO position.  Overall cardiac size and silhouette were normal. Global ejection fraction would be 60+ percent. There is no intracardiac calcification, intracavitary filling defect, or mitral regurgitation.  CORONARY ARTERIES: The coronary arteries arise and distribute normally. They are tortuous. 1. Right coronary artery: Right coronary artery is a large dominant vessel. It    is tortuous but normal. 2. Left main coronary artery: The left main coronary artery is normal. 3. Left anterior descending: The left anterior descending bifurcates early    in  the diagonal and a left anterior descending that continue to the apex.    Both are quite tortuous but normal. 4. Left circumflex: The left circumflex continues as a large obtuse marginal    that extends to the apex. It is quite tortuous but otherwise is normal.  OVERALL IMPRESSION: 1. Normal left ventricular function. 2. Normal coronary arteries. Dictated by:   Colleen Can Deborah Chalk, M.D. Attending Physician:  Eleanora Neighbor DD:  12/01/01 TD:  12/02/01 Job: [id number]

## 2010-12-17 ENCOUNTER — Other Ambulatory Visit: Payer: Self-pay | Admitting: Hematology and Oncology

## 2010-12-17 ENCOUNTER — Encounter (HOSPITAL_BASED_OUTPATIENT_CLINIC_OR_DEPARTMENT_OTHER): Payer: Medicare Other | Admitting: Hematology and Oncology

## 2010-12-17 DIAGNOSIS — D45 Polycythemia vera: Secondary | ICD-10-CM

## 2010-12-17 LAB — CBC WITH DIFFERENTIAL/PLATELET
BASO%: 0.3 % (ref 0.0–2.0)
EOS%: 3.8 % (ref 0.0–7.0)
MCH: 27.2 pg (ref 25.1–34.0)
MCHC: 32.6 g/dL (ref 31.5–36.0)
MONO#: 0.8 10*3/uL (ref 0.1–0.9)
RDW: 18.8 % — ABNORMAL HIGH (ref 11.2–14.5)
WBC: 7.4 10*3/uL (ref 3.9–10.3)
lymph#: 1.3 10*3/uL (ref 0.9–3.3)

## 2010-12-17 LAB — BASIC METABOLIC PANEL
Chloride: 104 mEq/L (ref 96–112)
Potassium: 4.6 mEq/L (ref 3.5–5.3)
Sodium: 140 mEq/L (ref 135–145)

## 2011-01-09 ENCOUNTER — Other Ambulatory Visit: Payer: Self-pay

## 2011-01-09 ENCOUNTER — Ambulatory Visit (INDEPENDENT_AMBULATORY_CARE_PROVIDER_SITE_OTHER): Payer: Medicare Other | Admitting: *Deleted

## 2011-01-09 DIAGNOSIS — Z950 Presence of cardiac pacemaker: Secondary | ICD-10-CM

## 2011-01-09 DIAGNOSIS — I441 Atrioventricular block, second degree: Secondary | ICD-10-CM

## 2011-01-10 LAB — REMOTE PACEMAKER DEVICE
ATRIAL PACING PM: 45
BAMS-0001: 175 {beats}/min
RV LEAD IMPEDENCE PM: 644 Ohm

## 2011-01-14 NOTE — Progress Notes (Signed)
Pacer remote check  

## 2011-01-28 ENCOUNTER — Encounter: Payer: Self-pay | Admitting: *Deleted

## 2011-02-26 ENCOUNTER — Encounter (HOSPITAL_BASED_OUTPATIENT_CLINIC_OR_DEPARTMENT_OTHER): Payer: Medicare Other | Admitting: Hematology and Oncology

## 2011-02-26 ENCOUNTER — Other Ambulatory Visit: Payer: Self-pay | Admitting: Hematology and Oncology

## 2011-02-26 DIAGNOSIS — D45 Polycythemia vera: Secondary | ICD-10-CM

## 2011-02-26 LAB — CBC WITH DIFFERENTIAL/PLATELET
BASO%: 0.4 % (ref 0.0–2.0)
LYMPH%: 21.4 % (ref 14.0–49.7)
MCHC: 32.3 g/dL (ref 31.5–36.0)
MCV: 86.2 fL (ref 79.5–101.0)
MONO%: 12.9 % (ref 0.0–14.0)
Platelets: 299 10*3/uL (ref 145–400)
RBC: 6.23 10*6/uL — ABNORMAL HIGH (ref 3.70–5.45)

## 2011-03-13 LAB — LIPID PANEL
HDL: 29 — ABNORMAL LOW
LDL Cholesterol: 101 — ABNORMAL HIGH
Total CHOL/HDL Ratio: 5.5
VLDL: 29

## 2011-03-13 LAB — BASIC METABOLIC PANEL
CO2: 29
Calcium: 10.5
Creatinine, Ser: 0.6
Glucose, Bld: 120 — ABNORMAL HIGH
Sodium: 138

## 2011-03-16 IMAGING — CR DG RIBS W/ CHEST 3+V*L*
3 series · 3 of 3 positions shown · non-contrast
Comparison: None

CLINICAL DATA: Left posterior rib pain and productive cough
secondary to syncope.  Slurred speech.

LEFT RIBS AND CHEST - 3+ VIEW

[w chest pa]
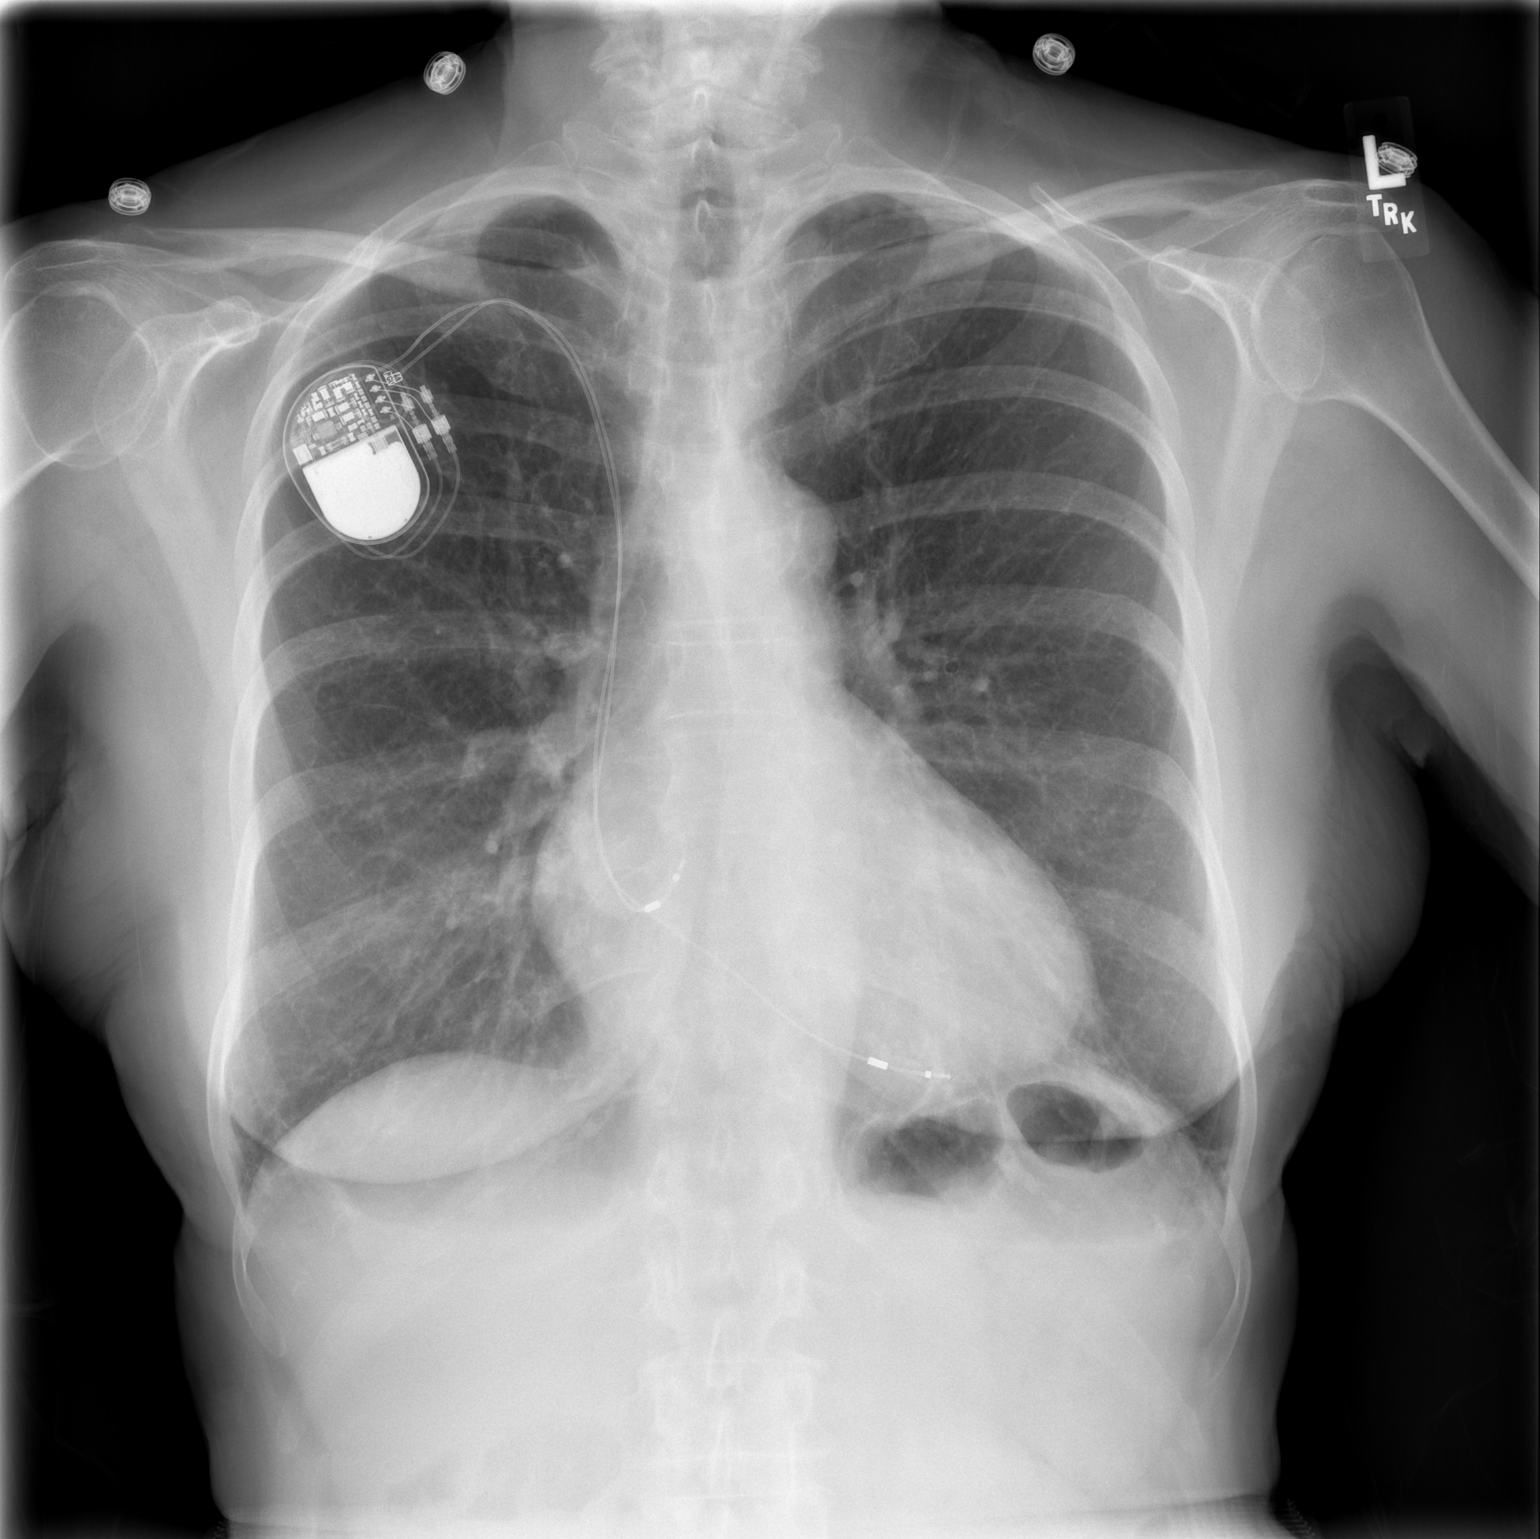

[w ribs ap/pa upper left *]
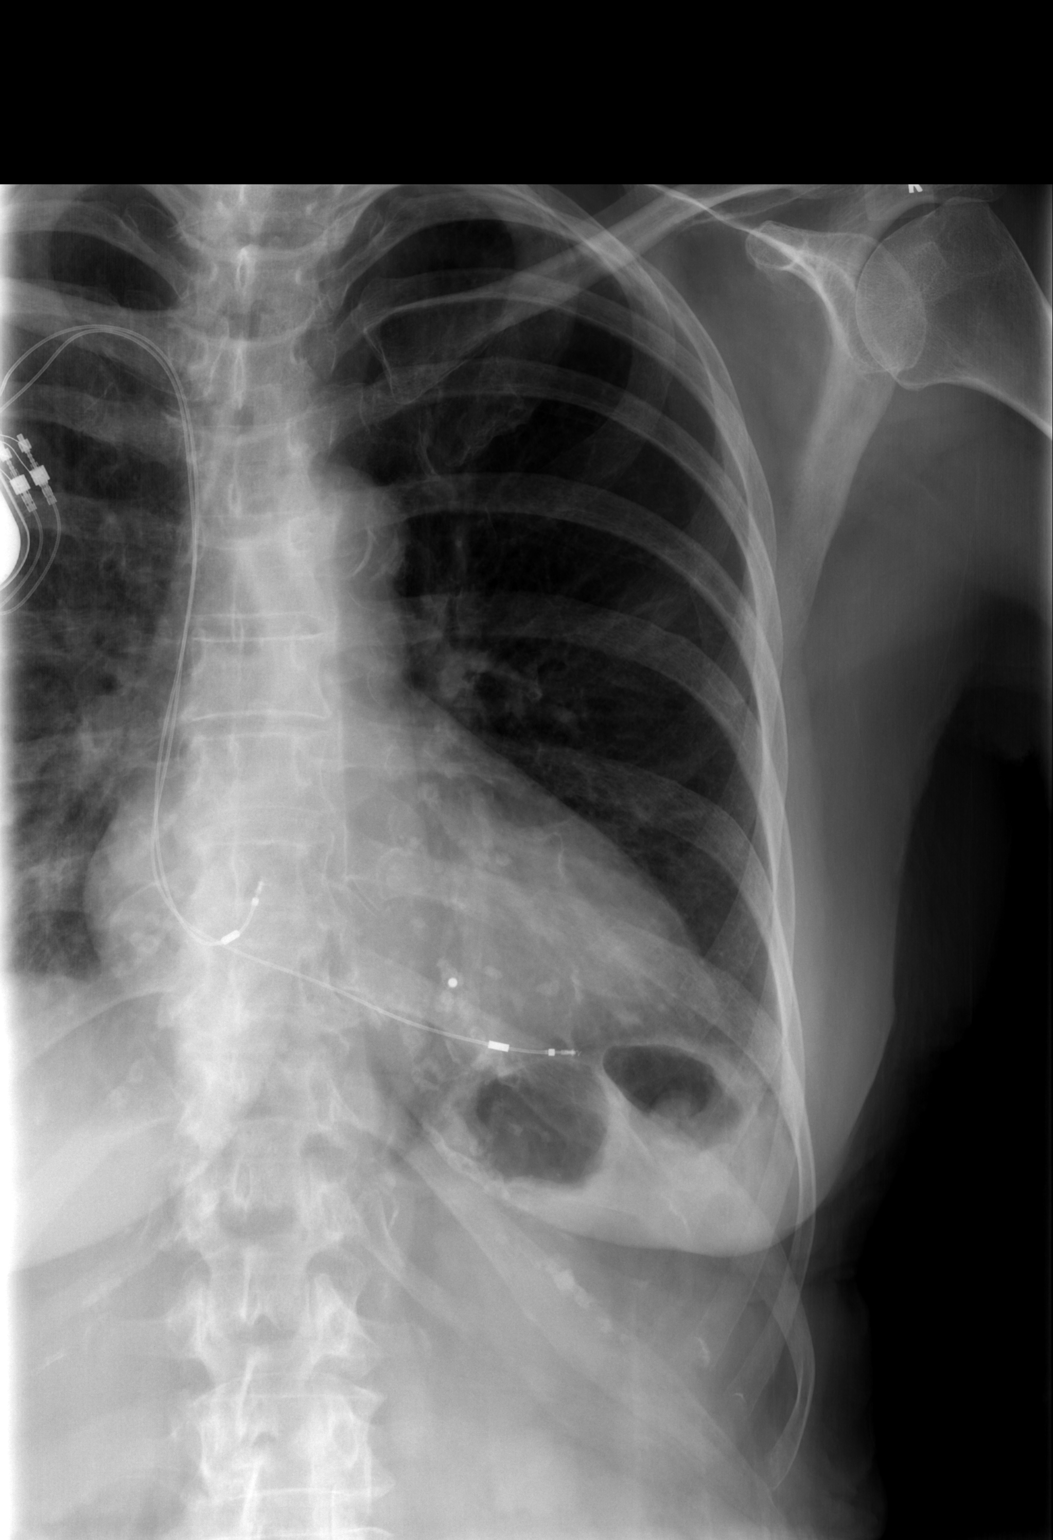

[w ribs oblique left *]
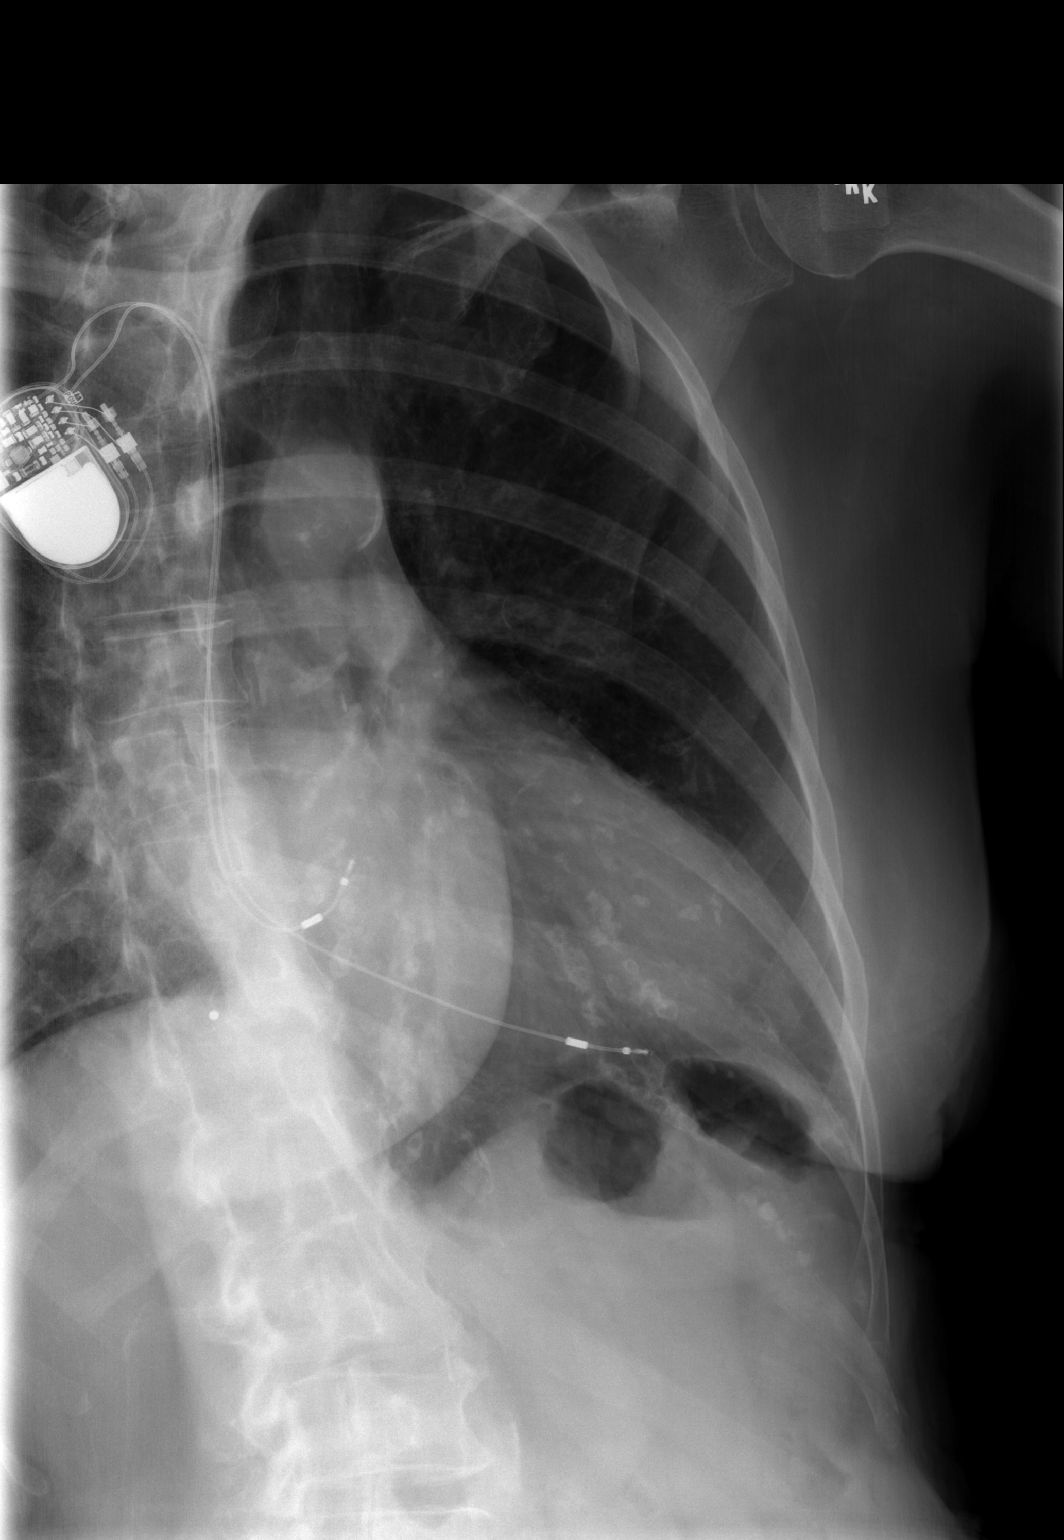

[3 of 3 positions shown; findings below may reference images not displayed]

FINDINGS: There is mild cardiomegaly.  The patient has a dual lead
pacer in place.  The pulmonary vascularity is normal and the lungs
are clear.  There is no bony abnormality.  Specifically, no
evidence of left rib fracture.  No pneumothorax.
IMPRESSION: Mild cardiomegaly.  Otherwise normal appearing chest and left ribs.

## 2011-03-16 IMAGING — CT CT HEAD W/O CM
1 series · 16 of 28 positions shown, 20 images · non-contrast
Comparison: CT head 12/22/2007

CLINICAL DATA: Syncopal episode.

CT HEAD WITHOUT CONTRAST
TECHNIQUE: Contiguous axial images were obtained from the base of
the skull through the vertex without contrast.

[Series 2: trauma head · axial · 0.47mm/px · z∈[+128,+254]mm · 16 of 28 slices shown, 20 images]
[im 2/28  brain]
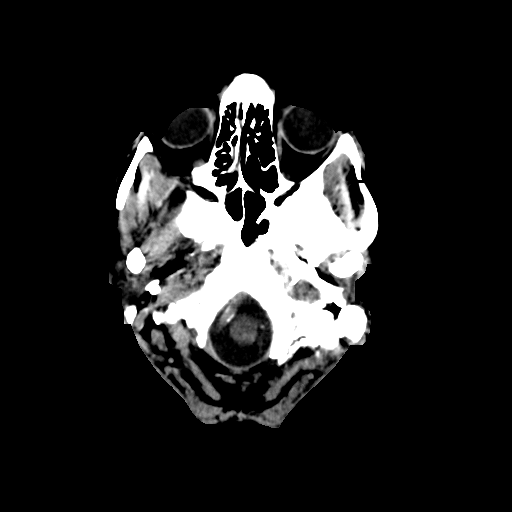
[im 2/28  bone]
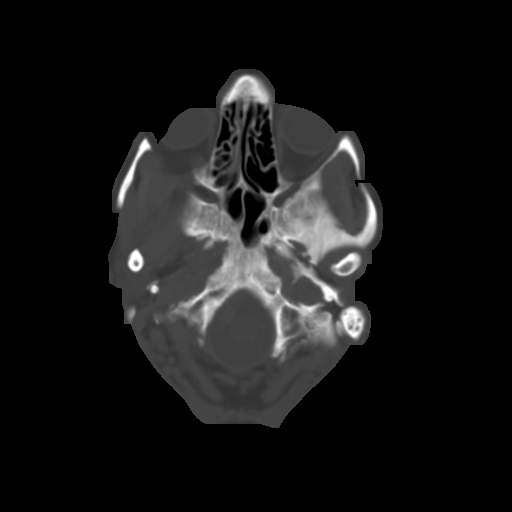
[im 4/28  brain]
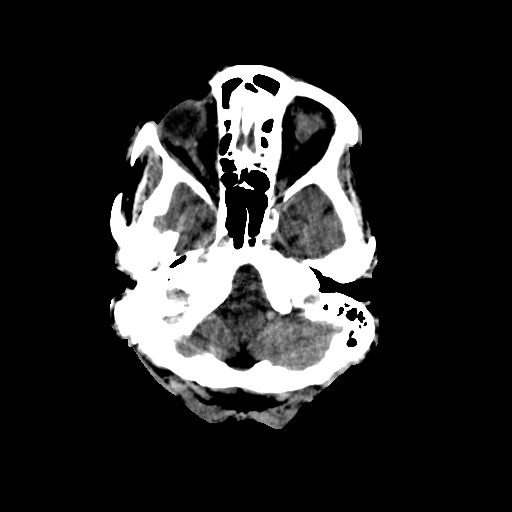
[im 6/28  brain]
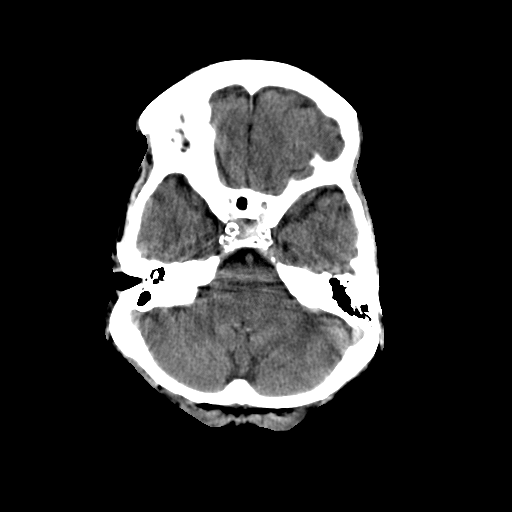
[im 7/28  brain]
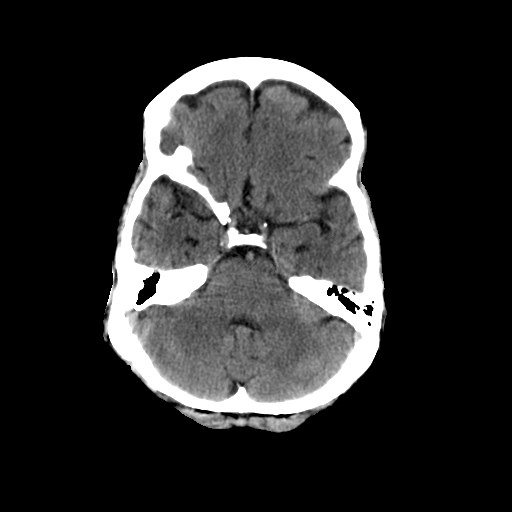
[im 9/28  brain]
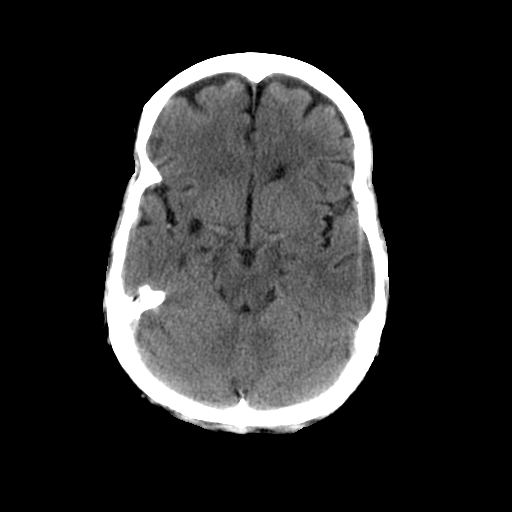
[im 9/28  bone]
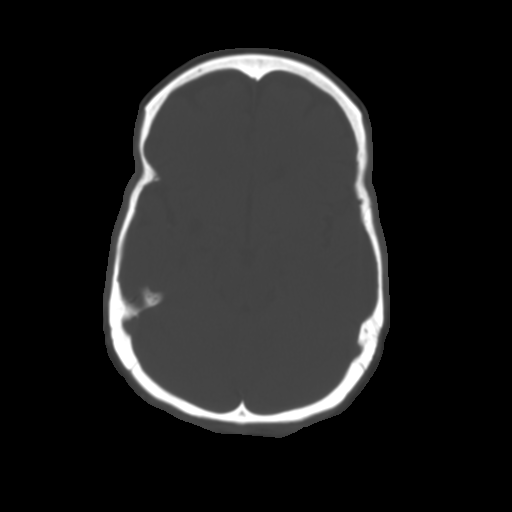
[im 10/28  brain]
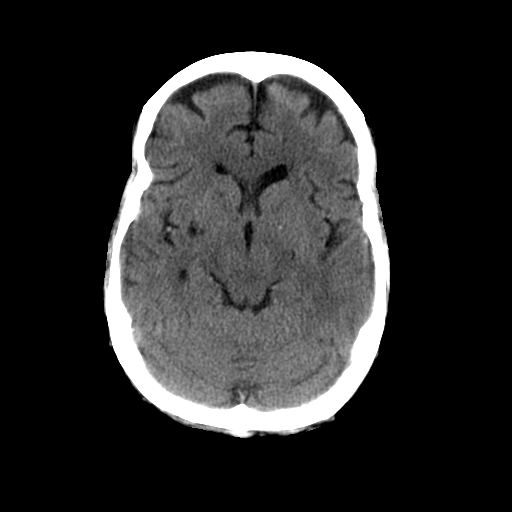
[im 12/28  brain]
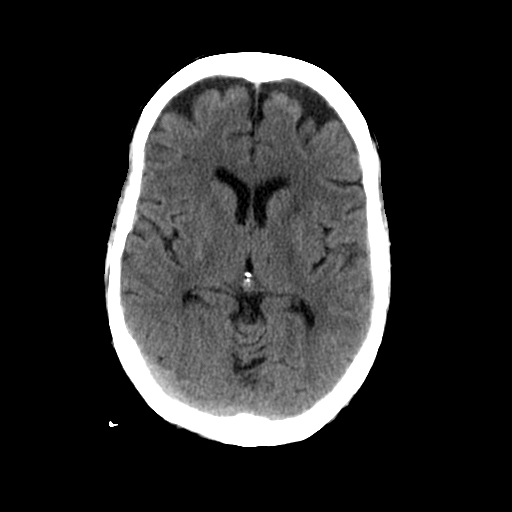
[im 14/28  brain]
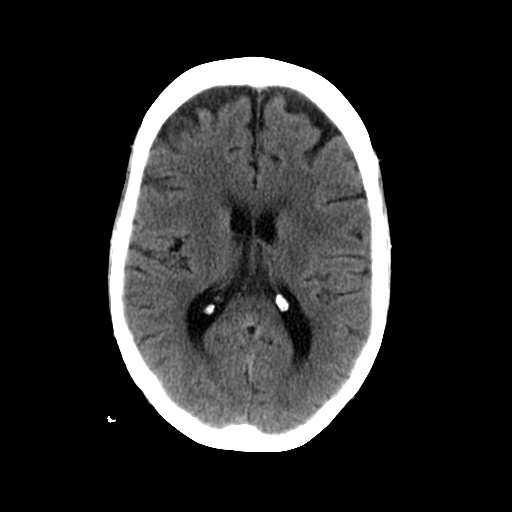
[im 15/28  brain]
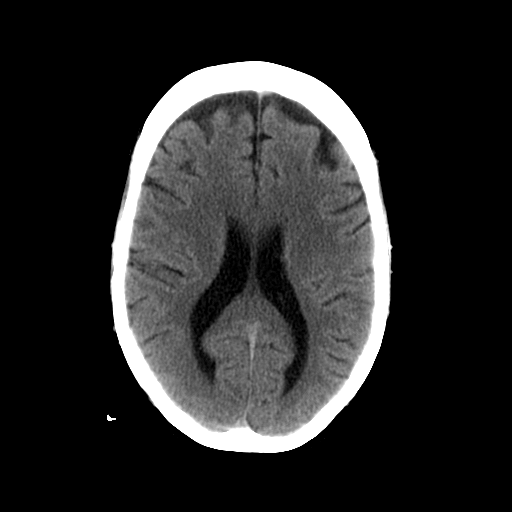
[im 15/28  bone]
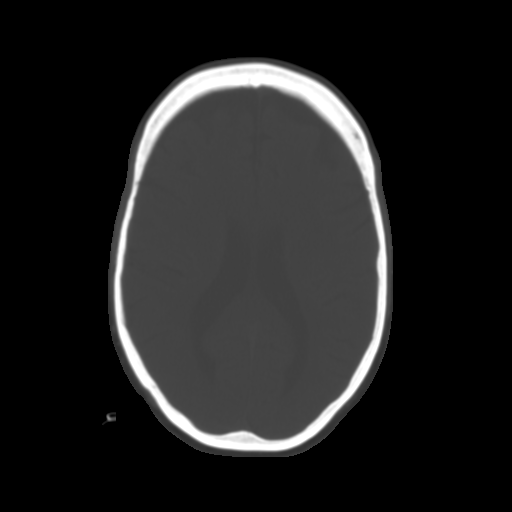
[im 17/28  brain]
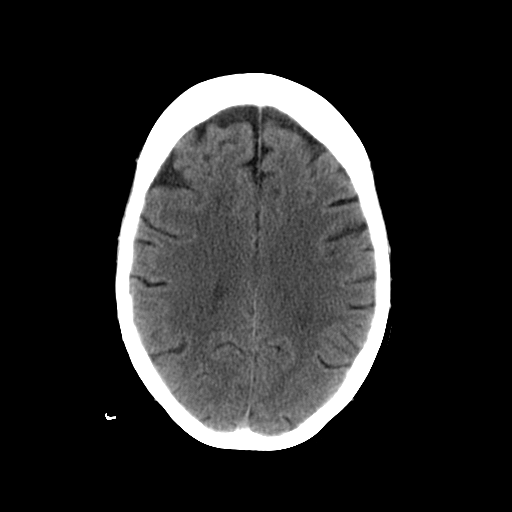
[im 19/28  brain]
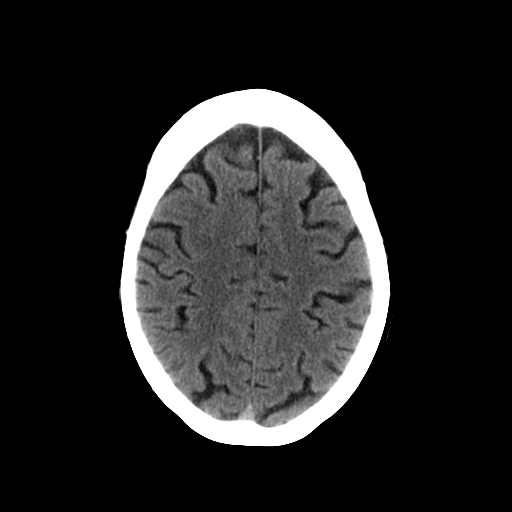
[im 20/28  brain]
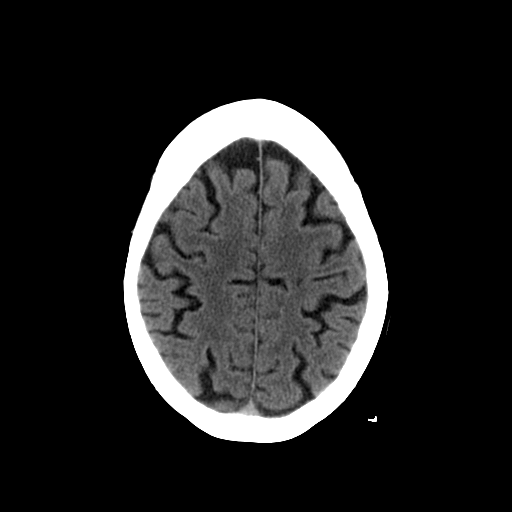
[im 22/28  brain]
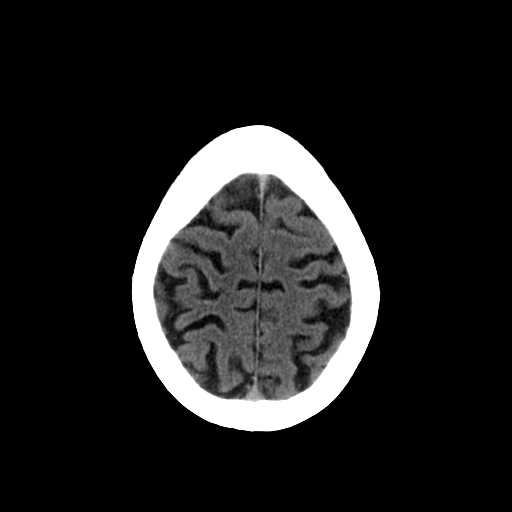
[im 22/28  bone]
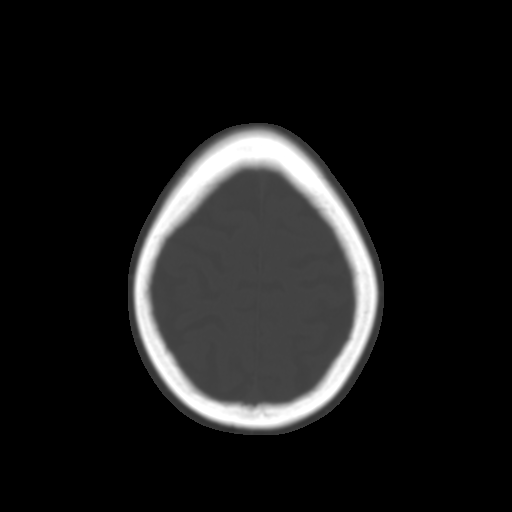
[im 23/28  brain]
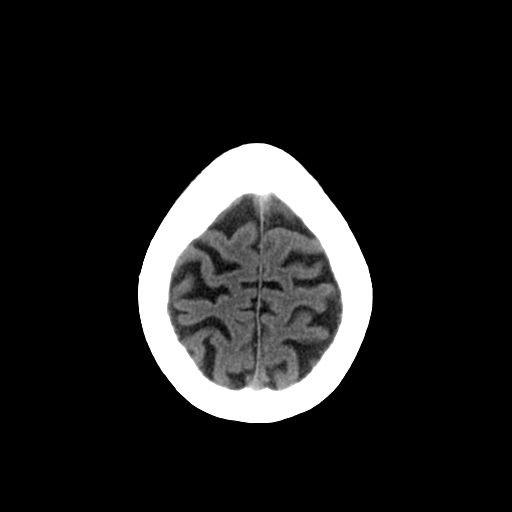
[im 25/28  brain]
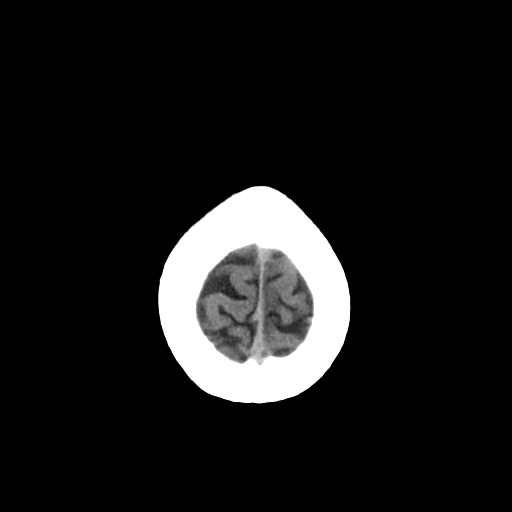
[im 27/28  brain]
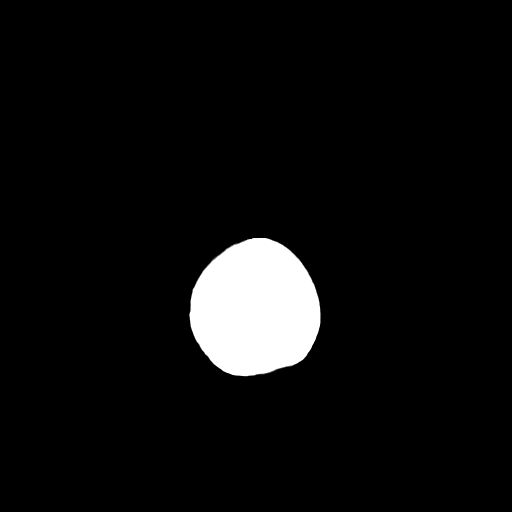

[16 of 28 positions shown; findings below may reference images not displayed]

FINDINGS: No acute intracranial findings.  Negative for hemorrhage,
acute infarction, or mass lesion.  No hydrocephalus.
IMPRESSION: No acute intracranial findings.

## 2011-04-10 ENCOUNTER — Other Ambulatory Visit: Payer: Self-pay

## 2011-04-10 ENCOUNTER — Ambulatory Visit (INDEPENDENT_AMBULATORY_CARE_PROVIDER_SITE_OTHER): Payer: Medicare Other | Admitting: *Deleted

## 2011-04-10 ENCOUNTER — Encounter: Payer: Self-pay | Admitting: Internal Medicine

## 2011-04-10 DIAGNOSIS — I441 Atrioventricular block, second degree: Secondary | ICD-10-CM

## 2011-04-10 DIAGNOSIS — Z950 Presence of cardiac pacemaker: Secondary | ICD-10-CM

## 2011-04-13 LAB — REMOTE PACEMAKER DEVICE
AL AMPLITUDE: 2.8 mv
AL IMPEDENCE PM: 476 Ohm
BAMS-0001: 175 {beats}/min
RV LEAD IMPEDENCE PM: 646 Ohm
VENTRICULAR PACING PM: 92

## 2011-04-17 NOTE — Progress Notes (Signed)
Pacer remote check  

## 2011-04-22 ENCOUNTER — Encounter: Payer: Self-pay | Admitting: *Deleted

## 2011-04-30 ENCOUNTER — Other Ambulatory Visit: Payer: Medicare Other | Admitting: Lab

## 2011-05-17 ENCOUNTER — Encounter: Payer: Self-pay | Admitting: *Deleted

## 2011-05-21 ENCOUNTER — Other Ambulatory Visit: Payer: Self-pay | Admitting: Hematology and Oncology

## 2011-05-21 ENCOUNTER — Telehealth: Payer: Self-pay | Admitting: Hematology and Oncology

## 2011-05-21 ENCOUNTER — Other Ambulatory Visit (HOSPITAL_BASED_OUTPATIENT_CLINIC_OR_DEPARTMENT_OTHER): Payer: Medicare Other | Admitting: Lab

## 2011-05-21 ENCOUNTER — Ambulatory Visit (HOSPITAL_BASED_OUTPATIENT_CLINIC_OR_DEPARTMENT_OTHER): Payer: Medicare Other | Admitting: Hematology and Oncology

## 2011-05-21 VITALS — BP 153/72 | HR 66 | Temp 97.4°F | Ht 61.0 in | Wt 124.3 lb

## 2011-05-21 DIAGNOSIS — D751 Secondary polycythemia: Secondary | ICD-10-CM

## 2011-05-21 DIAGNOSIS — D45 Polycythemia vera: Secondary | ICD-10-CM

## 2011-05-21 LAB — CBC WITH DIFFERENTIAL/PLATELET
Basophils Absolute: 0 10*3/uL (ref 0.0–0.1)
EOS%: 3.7 % (ref 0.0–7.0)
Eosinophils Absolute: 0.3 10*3/uL (ref 0.0–0.5)
HCT: 54.1 % — ABNORMAL HIGH (ref 34.8–46.6)
HGB: 17.5 g/dL — ABNORMAL HIGH (ref 11.6–15.9)
LYMPH%: 18.6 % (ref 14.0–49.7)
MCH: 28 pg (ref 25.1–34.0)
MCV: 86.8 fL (ref 79.5–101.0)
MONO%: 15.8 % — ABNORMAL HIGH (ref 0.0–14.0)
NEUT%: 61.5 % (ref 38.4–76.8)
Platelets: 376 10*3/uL (ref 145–400)
RDW: 18 % — ABNORMAL HIGH (ref 11.2–14.5)

## 2011-05-21 LAB — BASIC METABOLIC PANEL
BUN: 22 mg/dL (ref 6–23)
Creatinine, Ser: 0.81 mg/dL (ref 0.50–1.10)
Glucose, Bld: 93 mg/dL (ref 70–99)

## 2011-05-21 NOTE — Progress Notes (Signed)
CC:   [name]. [name], M.D.  IDENTIFYING STATEMENT:  The patient is an 77 year old woman with polycythemia vera who presents for followup.  INTERIM HISTORY:  The patient continues to have no current complaints. She has good energy levels.  Denies diarrhea.  She is tolerating Hydrea.  CBC obtained on 05/21/2011 notes a white cell count of 8.4, hemoglobin 17.5, hematocrit 54.1, platelets 376.  MEDICATIONS:  Hydrea, amlodipine, Zocor, B6 and B12.  ALLERGIES:  None.  PHYSICAL EXAM:  General:  A well-appearing, well-nourished older woman in no distress.  Vital Signs:  Pulse 66, blood pressure 153/72, temperature 97.4, respirations 18, weight 124 pounds.  HEENT:  Head is atraumatic, normocephalic.  Sclerae anicteric.  Mouth moist.  Neck: Supple.  Chest:  Clear.  CVS:  Unremarkable.  Abdomen:  Soft, nontender. Bowel sounds present.  Extremities:  No edema.  LAB DATA:  CBC as above.  Sodium 135, potassium 4.4, chloride 107 CO2 26, BUN 22, creatinine 0.81, glucose 93.  IMPRESSION AND PLAN:  [name] is an 77 year old woman with polycythemia diagnosed in 2008.  She is JAK2 mutation positive.  She received phlebotomies in the past and is on Hydrea.  Her blood counts are stable.  She will continue the current dose and schedule for which she takes 500 mg 4 times a week.  She follows up in 6 months' time.  She Is to have a CBC in 3 months' time.    ______________________________ [name], M.D. LIO/MEDQ  D:  05/21/2011  T:  05/21/2011  Job:  [id number]

## 2011-05-21 NOTE — Progress Notes (Signed)
This office note has been dictated.

## 2011-05-21 NOTE — Telephone Encounter (Signed)
gv pt appt schedule for June.  °

## 2011-06-05 ENCOUNTER — Other Ambulatory Visit: Payer: Self-pay | Admitting: Hematology and Oncology

## 2011-06-05 DIAGNOSIS — D45 Polycythemia vera: Secondary | ICD-10-CM

## 2011-06-23 ENCOUNTER — Telehealth: Payer: Self-pay | Admitting: Internal Medicine

## 2011-06-23 DIAGNOSIS — M25559 Pain in unspecified hip: Secondary | ICD-10-CM | POA: Diagnosis not present

## 2011-06-23 DIAGNOSIS — M25519 Pain in unspecified shoulder: Secondary | ICD-10-CM | POA: Diagnosis not present

## 2011-06-23 DIAGNOSIS — S4350XA Sprain of unspecified acromioclavicular joint, initial encounter: Secondary | ICD-10-CM | POA: Diagnosis not present

## 2011-06-23 NOTE — Telephone Encounter (Signed)
Pt's has appt on 06-30-11, pt passed out last Thursday, doesn't remember if had any lightheadedness or dizziness before she passed out, son wants her seen this week

## 2011-06-23 NOTE — Telephone Encounter (Signed)
Spoke with patients son  They will send over transmission tonight or in the morning to see if anything as changed that we can see on the device

## 2011-06-24 NOTE — Telephone Encounter (Signed)
Called son and let him know we received the transmission and it did not show any irregularities  during the time of which he said there was a problem  Keep follow up appointment

## 2011-06-30 ENCOUNTER — Encounter: Payer: Self-pay | Admitting: Internal Medicine

## 2011-06-30 ENCOUNTER — Ambulatory Visit (INDEPENDENT_AMBULATORY_CARE_PROVIDER_SITE_OTHER): Payer: Medicare Other | Admitting: Internal Medicine

## 2011-06-30 ENCOUNTER — Ambulatory Visit (INDEPENDENT_AMBULATORY_CARE_PROVIDER_SITE_OTHER)
Admission: RE | Admit: 2011-06-30 | Discharge: 2011-06-30 | Disposition: A | Discharge status: Home / Self Care | Payer: Medicare Other | Source: Ambulatory Visit | Attending: Internal Medicine | Admitting: Internal Medicine

## 2011-06-30 DIAGNOSIS — I441 Atrioventricular block, second degree: Secondary | ICD-10-CM | POA: Diagnosis not present

## 2011-06-30 DIAGNOSIS — R5383 Other fatigue: Secondary | ICD-10-CM

## 2011-06-30 DIAGNOSIS — R5381 Other malaise: Secondary | ICD-10-CM

## 2011-06-30 DIAGNOSIS — Z950 Presence of cardiac pacemaker: Secondary | ICD-10-CM | POA: Diagnosis not present

## 2011-06-30 DIAGNOSIS — R55 Syncope and collapse: Secondary | ICD-10-CM

## 2011-06-30 DIAGNOSIS — Z09 Encounter for follow-up examination after completed treatment for conditions other than malignant neoplasm: Secondary | ICD-10-CM | POA: Diagnosis not present

## 2011-06-30 DIAGNOSIS — R42 Dizziness and giddiness: Secondary | ICD-10-CM

## 2011-06-30 LAB — PACEMAKER DEVICE OBSERVATION
AL AMPLITUDE: 5.6 mv
AL IMPEDENCE PM: 444 Ohm
AL THRESHOLD: 1 V
RV LEAD IMPEDENCE PM: 661 Ohm
RV LEAD THRESHOLD: 0.75 V

## 2011-06-30 LAB — CBC WITH DIFFERENTIAL/PLATELET
Basophils Absolute: 0 10*3/uL (ref 0.0–0.1)
Eosinophils Absolute: 0.3 10*3/uL (ref 0.0–0.7)
HCT: 51.8 % — ABNORMAL HIGH (ref 36.0–46.0)
Hemoglobin: 17 g/dL — ABNORMAL HIGH (ref 12.0–15.0)
Lymphs Abs: 1.4 10*3/uL (ref 0.7–4.0)
MCHC: 32.8 g/dL (ref 30.0–36.0)
Monocytes Relative: 14.8 % — ABNORMAL HIGH (ref 3.0–12.0)
Neutro Abs: 4.9 10*3/uL (ref 1.4–7.7)
Platelets: 393 10*3/uL (ref 150.0–400.0)
RDW: 17.2 % — ABNORMAL HIGH (ref 11.5–14.6)

## 2011-06-30 LAB — BASIC METABOLIC PANEL
BUN: 21 mg/dL (ref 6–23)
CO2: 26 mEq/L (ref 19–32)
Calcium: 9.7 mg/dL (ref 8.4–10.5)
GFR: 73.2 mL/min (ref >60.00)
Glucose, Bld: 91 mg/dL (ref 70–99)
Potassium: 3.9 mEq/L (ref 3.5–5.1)
Sodium: 143 mEq/L (ref 135–145)

## 2011-06-30 LAB — TSH: TSH: 0.8 u[IU]/mL (ref 0.35–5.50)

## 2011-06-30 NOTE — Assessment & Plan Note (Signed)
Normal pacemaker function as above

## 2011-06-30 NOTE — Patient Instructions (Signed)
Your physician recommends that you have lab work today: BMP, CBC, TSH, Free T4  Your physician has requested that you have an echocardiogram. Echocardiography is a painless test that uses sound waves to create images of your heart. It provides your doctor with information about the size and shape of your heart and how well your heart's chambers and valves are working. This procedure takes approximately one hour. There are no restrictions for this procedure.  Please have a chest x-ray performed at the [facility] office.   Your physician wants you to follow-up in: 12 MONTHS with Dr Johney Frame and Melburn Hake 10/02/11.  You will receive a reminder letter in the mail two months in advance. If you don't receive a letter, please call our office to  schedule the follow-up appointment.  Your physician recommends that you continue on your current medications as directed. Please refer to the Current Medication list given to you today.

## 2011-06-30 NOTE — Progress Notes (Signed)
The patient presents today for routine electrophysiology followup.  She reports that 2 weeks ago, she had an episode of syncope.  She was walking on the side walk when she had abrupt loss of consciousness and woke lying face down on the side walk.  She denies symptoms prior to the event and is not certain as to whether she may have tripped and fell or had frank syncope.  She has had no further episodes.  Today, she denies symptoms of palpitations, chest pain, shortness of breath,  lower extremity edema, dizziness, or further syncope.  The patient feels that she is tolerating medications without difficulties and is otherwise without complaint today.   Past Medical History  Diagnosis Date  . Complete heart block   . Hypercholesterolemia   . Polycythemia vera   . Syncope    Past Surgical History  Procedure Date  . Pacemaker insertion     revised for RV lead fracture by Dr Deborah Chalk  . Hand surgery     S/P Carpal tunnel repair  . Appendectomy     Current Outpatient Prescriptions  Medication Sig Dispense Refill  . amLODipine (NORVASC) 5 MG tablet Take 5 mg by mouth daily.        Marland Kitchen aspirin 325 MG tablet Take 325 mg by mouth 2 (two) times daily.        . beta carotene w/minerals (OCUVITE) tablet Take 1 tablet by mouth daily.        . calcium-vitamin D (OSCAL WITH D) 500-200 MG-UNIT per tablet Take 2 tablets by mouth daily.        . fluticasone (FLONASE) 50 MCG/ACT nasal spray Place 1 spray into the nose 2 (two) times daily. 1 spray in EACH EYE BID       . hydroxyurea (HYDREA) 500 MG capsule 4 per week      . Polyethyl Glycol-Propyl Glycol (SYSTANE ULTRA OP) Apply 1 drop to eye 2 (two) times daily. Takes 1 drop in each eye  BID.       Marland Kitchen pyridOXINE (VITAMIN B-6) 100 MG tablet Take 100 mg by mouth daily.        . simvastatin (ZOCOR) 10 MG tablet Take 10 mg by mouth daily.        . vitamin B-12 (CYANOCOBALAMIN) 500 MCG tablet Take 500 mcg by mouth daily.          No Known Allergies  History    Social History  . Marital Status: Widowed    Spouse Name: N/A    Number of Children: N/A  . Years of Education: N/A   Occupational History  . Not on file.   Social History Main Topics  . Smoking status: Never Smoker   . Smokeless tobacco: Not on file  . Alcohol Use: No  . Drug Use: No  . Sexually Active: Not on file   Other Topics Concern  . Not on file   Social History Narrative  . No narrative on file    ROS-  All systems are reviewed and are negative except as outlined in the HPI above   Physical Exam: Filed Vitals:   06/30/11 1253 06/30/11 1254 06/30/11 1255 06/30/11 1256  BP: 151/78 154/76 150/83 143/83  Pulse: 65 68 80 70  Weight:    124 lb (56.246 kg)    GEN- The patient is well appearing, alert and oriented x 3 today.   Head- normocephalic, atraumatic Eyes-  Sclera clear, conjunctiva pink Ears- hearing intact Oropharynx- clear, small healing lip  laceration Neck- supple, no JVP Lymph- no cervical lymphadenopathy Lungs- Clear to ausculation bilaterally, normal work of breathing Chest- pacemaker pocket is well healed Heart- Regular rate and rhythm, no murmurs, rubs or gallops, PMI not laterally displaced GI- soft, NT, ND, + BS Extremities- no clubbing, cyanosis, or edema MS- no significant deformity or atrophy Skin- no rash or lesion Psych- euthymic mood, full affect Neuro- strength and sensation are intact  Pacemaker interrogation- reviewed in detail today,  See PACEART report  Assessment and Plan:

## 2011-06-30 NOTE — Assessment & Plan Note (Addendum)
Recent syncope is of an unclear etiology.  Her pacemaker function today appears to be normal.  No tachy arrhythmias are noted to correspond with the episode.  I tried isometric exercise, ROM of the R arm, and manipulation of the pacemaker pocket without any indication of pacemaker noise/ changes in impedance/ sensing/pacing changes, etc. I have decreased the RV sensistivity to avoid oversensing and also programmed adaptive threshold to off to hopefully prevent any transient pacemaker issues.  I will also obtain a CXR and echo today. She is not orthostatic. I will check BMET, TFTs, and CBC to look for metabolic causes for the event. She should not drive.  I will ask her to follow-up with Norma Fredrickson in 6 weeks to make sure that she is still doing well at that time. If no further events, I will see her again in 1 year.

## 2011-07-02 ENCOUNTER — Ambulatory Visit (HOSPITAL_COMMUNITY): Payer: Medicare Other | Attending: Cardiovascular Disease | Admitting: Radiology

## 2011-07-02 DIAGNOSIS — R0609 Other forms of dyspnea: Secondary | ICD-10-CM | POA: Insufficient documentation

## 2011-07-02 DIAGNOSIS — Z950 Presence of cardiac pacemaker: Secondary | ICD-10-CM

## 2011-07-02 DIAGNOSIS — R55 Syncope and collapse: Secondary | ICD-10-CM | POA: Insufficient documentation

## 2011-07-02 DIAGNOSIS — I441 Atrioventricular block, second degree: Secondary | ICD-10-CM

## 2011-07-02 DIAGNOSIS — R0989 Other specified symptoms and signs involving the circulatory and respiratory systems: Secondary | ICD-10-CM | POA: Insufficient documentation

## 2011-07-02 DIAGNOSIS — I10 Essential (primary) hypertension: Secondary | ICD-10-CM | POA: Diagnosis not present

## 2011-07-15 DIAGNOSIS — Z1331 Encounter for screening for depression: Secondary | ICD-10-CM | POA: Diagnosis not present

## 2011-07-15 DIAGNOSIS — R55 Syncope and collapse: Secondary | ICD-10-CM | POA: Diagnosis not present

## 2011-07-17 ENCOUNTER — Telehealth: Payer: Self-pay | Admitting: Internal Medicine

## 2011-07-17 NOTE — Telephone Encounter (Signed)
New problem Pt called. She said she is supposed to get heart monitor and hasnt heard anything.

## 2011-07-18 ENCOUNTER — Telehealth: Payer: Self-pay

## 2011-07-18 NOTE — Telephone Encounter (Signed)
Patient has appt for 07/24/2011 @ 3:30pm

## 2011-07-24 ENCOUNTER — Encounter (INDEPENDENT_AMBULATORY_CARE_PROVIDER_SITE_OTHER): Payer: Medicare Other

## 2011-07-24 DIAGNOSIS — R55 Syncope and collapse: Secondary | ICD-10-CM

## 2011-08-15 ENCOUNTER — Encounter: Payer: Self-pay | Admitting: Internal Medicine

## 2011-08-15 ENCOUNTER — Ambulatory Visit: Payer: Medicare Other | Admitting: Internal Medicine

## 2011-08-15 ENCOUNTER — Encounter (INDEPENDENT_AMBULATORY_CARE_PROVIDER_SITE_OTHER): Payer: Medicare Other

## 2011-08-15 ENCOUNTER — Ambulatory Visit (INDEPENDENT_AMBULATORY_CARE_PROVIDER_SITE_OTHER): Payer: Medicare Other | Admitting: Internal Medicine

## 2011-08-15 DIAGNOSIS — I441 Atrioventricular block, second degree: Secondary | ICD-10-CM | POA: Diagnosis not present

## 2011-08-15 DIAGNOSIS — I10 Essential (primary) hypertension: Secondary | ICD-10-CM | POA: Diagnosis not present

## 2011-08-15 DIAGNOSIS — R55 Syncope and collapse: Secondary | ICD-10-CM

## 2011-08-15 LAB — PACEMAKER DEVICE OBSERVATION
AL AMPLITUDE: 5.6 mv
AL IMPEDENCE PM: 462 Ohm
ATRIAL PACING PM: 46
BATTERY VOLTAGE: 2.79 V
RV LEAD IMPEDENCE PM: 613 Ohm
VENTRICULAR PACING PM: 63

## 2011-08-15 NOTE — Patient Instructions (Signed)
Your physician wants you to follow-up in: 6 months with Lori Gerhardt, NP.  You will receive a reminder letter in the mail two months in advance. If you don't receive a letter, please call our office to schedule the follow-up appointment.  Your physician wants you to follow-up in: 12 months with Dr. Allred.  You will receive a reminder letter in the mail two months in advance. If you don't receive a letter, please call our office to schedule the follow-up appointment.  

## 2011-08-17 NOTE — Progress Notes (Signed)
The patient presents today for routine electrophysiology followup.   She recently was seen by me for syncope.  She has begun wearing support hose and has had no further episodes.  Today, she denies symptoms of palpitations, chest pain, shortness of breath,  lower extremity edema, dizziness, or further syncope.  The patient feels that she is tolerating medications without difficulties and is otherwise without complaint today.   Past Medical History  Diagnosis Date  . Complete heart block   . Hypercholesterolemia   . Polycythemia vera   . Syncope    Past Surgical History  Procedure Date  . Pacemaker insertion     revised for RV lead fracture by Dr Deborah Chalk  . Hand surgery     S/P Carpal tunnel repair  . Appendectomy     Current Outpatient Prescriptions  Medication Sig Dispense Refill  . amLODipine (NORVASC) 5 MG tablet Take 5 mg by mouth daily.        Marland Kitchen aspirin 325 MG tablet Take 325 mg by mouth 2 (two) times daily.        . beta carotene w/minerals (OCUVITE) tablet Take 1 tablet by mouth daily.        . calcium-vitamin D (OSCAL WITH D) 500-200 MG-UNIT per tablet Take 2 tablets by mouth daily.        . fluticasone (FLONASE) 50 MCG/ACT nasal spray Place 1 spray into the nose 2 (two) times daily. 1 spray in EACH EYE BID       . hydroxyurea (HYDREA) 500 MG capsule 4 per week      . Polyethyl Glycol-Propyl Glycol (SYSTANE ULTRA OP) Apply 1 drop to eye 2 (two) times daily. Takes 1 drop in each eye  BID.       Marland Kitchen pyridOXINE (VITAMIN B-6) 100 MG tablet Take 100 mg by mouth daily.        . simvastatin (ZOCOR) 10 MG tablet Take 10 mg by mouth daily.        . vitamin B-12 (CYANOCOBALAMIN) 500 MCG tablet Take 500 mcg by mouth daily.          No Known Allergies  History   Social History  . Marital Status: Widowed    Spouse Name: N/A    Number of Children: N/A  . Years of Education: N/A   Occupational History  . Not on file.   Social History Main Topics  . Smoking status: Never Smoker     . Smokeless tobacco: Not on file  . Alcohol Use: No  . Drug Use: No  . Sexually Active: Not on file   Other Topics Concern  . Not on file   Social History Narrative  . No narrative on file    Physical Exam: Filed Vitals:   08/15/11 1401  BP: 150/78  Pulse: 75  Height: 5\' 2"  (1.575 m)  Weight: 123 lb (55.792 kg)    GEN- The patient is well appearing, alert and oriented x 3 today.   Head- normocephalic, atraumatic Eyes-  Sclera clear, conjunctiva pink Ears- hearing intact Oropharynx- clear, small healing lip laceration Neck- supple, no JVP Lymph- no cervical lymphadenopathy Lungs- Clear to ausculation bilaterally, normal work of breathing Chest- pacemaker pocket is well healed Heart- Regular rate and rhythm, no murmurs, rubs or gallops, PMI not laterally displaced GI- soft, NT, ND, + BS Extremities- no clubbing, cyanosis, or edema MS- no significant deformity or atrophy Skin- no rash or lesion Psych- euthymic mood, full affect Neuro- strength and sensation are intact  Pacemaker interrogation- reviewed in detail today,  See PACEART report Event monitor 2/13- no arrhythmias, normal pacemaker function  Assessment and Plan:

## 2011-08-17 NOTE — Assessment & Plan Note (Signed)
Would not aggressively control BP as this may lead to hypotensive episodes No changes today

## 2011-08-17 NOTE — Assessment & Plan Note (Signed)
Event monitor is reviewed with patient and was normal.  She continues to refrain from driving. Her symptoms have resolved with support stockings Adequate hydration advised Would avoid aggressive blood pressure control as this may lead to further hypotensive events.

## 2011-08-17 NOTE — Assessment & Plan Note (Signed)
Normal pacemaker function See Pace Art report No changes today  

## 2011-09-04 ENCOUNTER — Encounter: Payer: Self-pay | Admitting: *Deleted

## 2011-10-01 DIAGNOSIS — E785 Hyperlipidemia, unspecified: Secondary | ICD-10-CM | POA: Diagnosis not present

## 2011-10-01 DIAGNOSIS — J309 Allergic rhinitis, unspecified: Secondary | ICD-10-CM | POA: Diagnosis not present

## 2011-10-01 DIAGNOSIS — I10 Essential (primary) hypertension: Secondary | ICD-10-CM | POA: Diagnosis not present

## 2011-11-11 ENCOUNTER — Other Ambulatory Visit: Payer: Self-pay | Admitting: Hematology and Oncology

## 2011-11-11 DIAGNOSIS — D45 Polycythemia vera: Secondary | ICD-10-CM

## 2011-11-12 ENCOUNTER — Telehealth: Payer: Self-pay | Admitting: *Deleted

## 2011-11-12 NOTE — Telephone Encounter (Signed)
patient called to reschedule her appointment per staff message from md left patient voice message to inform the patient that the md still wants her to do the lab appointment on 11-19-2011 at 9:00am 

## 2011-11-12 NOTE — Telephone Encounter (Signed)
patient called to reschedule her appointment per staff message from md left patient voice message to inform the patient that the md still wants her to do the lab appointment on 11-19-2011 at 9:00am

## 2011-11-13 ENCOUNTER — Telehealth: Payer: Self-pay | Admitting: *Deleted

## 2011-11-13 ENCOUNTER — Other Ambulatory Visit: Payer: Self-pay | Admitting: *Deleted

## 2011-11-13 DIAGNOSIS — D45 Polycythemia vera: Secondary | ICD-10-CM

## 2011-11-13 NOTE — Telephone Encounter (Signed)
Called pt at home and left message on voice mail re:  Pt needs to have labs done in June as scheduled.   Left message for date and time for lab and f/u with md on 05/25/12.  Asked pt to call office to confirm that she has received message.

## 2011-11-14 ENCOUNTER — Telehealth: Payer: Self-pay | Admitting: *Deleted

## 2011-11-14 NOTE — Telephone Encounter (Signed)
gave patient confirm over the phone for the lab only appointment

## 2011-11-19 ENCOUNTER — Ambulatory Visit: Payer: Medicare Other | Admitting: Hematology and Oncology

## 2011-11-19 ENCOUNTER — Other Ambulatory Visit (HOSPITAL_BASED_OUTPATIENT_CLINIC_OR_DEPARTMENT_OTHER): Payer: Medicare Other | Admitting: Lab

## 2011-11-19 DIAGNOSIS — D45 Polycythemia vera: Secondary | ICD-10-CM | POA: Diagnosis not present

## 2011-11-19 DIAGNOSIS — D751 Secondary polycythemia: Secondary | ICD-10-CM

## 2011-11-19 LAB — CBC WITH DIFFERENTIAL/PLATELET
EOS%: 2.9 % (ref 0.0–7.0)
Eosinophils Absolute: 0.2 10*3/uL (ref 0.0–0.5)
HGB: 17.7 g/dL — ABNORMAL HIGH (ref 11.6–15.9)
MCV: 87.8 fL (ref 79.5–101.0)
MONO%: 16.6 % — ABNORMAL HIGH (ref 0.0–14.0)
NEUT#: 5.3 10*3/uL (ref 1.5–6.5)
RBC: 6.34 10*6/uL — ABNORMAL HIGH (ref 3.70–5.45)
RDW: 16.9 % — ABNORMAL HIGH (ref 11.2–14.5)
WBC: 8.4 10*3/uL (ref 3.9–10.3)
lymph#: 1.4 10*3/uL (ref 0.9–3.3)
nRBC: 0 % (ref 0–0)

## 2011-11-19 LAB — COMPREHENSIVE METABOLIC PANEL
Albumin: 4.2 g/dL (ref 3.5–5.2)
Alkaline Phosphatase: 66 U/L (ref 39–117)
BUN: 18 mg/dL (ref 6–23)
CO2: 27 mEq/L (ref 19–32)
Calcium: 10.1 mg/dL (ref 8.4–10.5)
Chloride: 106 mEq/L (ref 96–112)
Glucose, Bld: 92 mg/dL (ref 70–99)
Potassium: 4.7 mEq/L (ref 3.5–5.3)
Sodium: 141 mEq/L (ref 135–145)
Total Protein: 6.7 g/dL (ref 6.0–8.3)

## 2011-11-20 ENCOUNTER — Telehealth: Payer: Self-pay | Admitting: *Deleted

## 2011-11-20 NOTE — Telephone Encounter (Signed)
Called pt at home and informed pt re:  Labs good, continue with  Hydrea  500 mg   4 x /week  As per md.   Confirmed f/u appt with md for 05/25/12.   Pt voiced understanding.

## 2011-12-04 DIAGNOSIS — H04229 Epiphora due to insufficient drainage, unspecified lacrimal gland: Secondary | ICD-10-CM | POA: Diagnosis not present

## 2011-12-04 DIAGNOSIS — H43819 Vitreous degeneration, unspecified eye: Secondary | ICD-10-CM | POA: Diagnosis not present

## 2012-03-29 DIAGNOSIS — J309 Allergic rhinitis, unspecified: Secondary | ICD-10-CM | POA: Diagnosis not present

## 2012-03-29 DIAGNOSIS — R51 Headache: Secondary | ICD-10-CM | POA: Diagnosis not present

## 2012-03-31 ENCOUNTER — Other Ambulatory Visit: Payer: Self-pay | Admitting: Family Medicine

## 2012-03-31 DIAGNOSIS — E785 Hyperlipidemia, unspecified: Secondary | ICD-10-CM | POA: Diagnosis not present

## 2012-03-31 DIAGNOSIS — I10 Essential (primary) hypertension: Secondary | ICD-10-CM | POA: Diagnosis not present

## 2012-03-31 DIAGNOSIS — R4781 Slurred speech: Secondary | ICD-10-CM

## 2012-03-31 DIAGNOSIS — R51 Headache: Secondary | ICD-10-CM | POA: Diagnosis not present

## 2012-04-01 ENCOUNTER — Ambulatory Visit
Admission: RE | Admit: 2012-04-01 | Discharge: 2012-04-01 | Disposition: A | Discharge status: Home / Self Care | Payer: Medicare Other | Source: Ambulatory Visit | Attending: Family Medicine | Admitting: Family Medicine

## 2012-04-01 DIAGNOSIS — G9389 Other specified disorders of brain: Secondary | ICD-10-CM | POA: Diagnosis not present

## 2012-04-01 DIAGNOSIS — R4781 Slurred speech: Secondary | ICD-10-CM

## 2012-04-01 MED ORDER — IOHEXOL 300 MG/ML  SOLN
75.0000 mL | Freq: Once | INTRAMUSCULAR | Status: AC | PRN
  Administered 2012-04-01: 75 mL via INTRAVENOUS

## 2012-04-02 DIAGNOSIS — Z961 Presence of intraocular lens: Secondary | ICD-10-CM | POA: Diagnosis not present

## 2012-04-02 DIAGNOSIS — H52209 Unspecified astigmatism, unspecified eye: Secondary | ICD-10-CM | POA: Diagnosis not present

## 2012-04-02 DIAGNOSIS — H35369 Drusen (degenerative) of macula, unspecified eye: Secondary | ICD-10-CM | POA: Diagnosis not present

## 2012-04-02 DIAGNOSIS — H04229 Epiphora due to insufficient drainage, unspecified lacrimal gland: Secondary | ICD-10-CM | POA: Diagnosis not present

## 2012-04-20 ENCOUNTER — Other Ambulatory Visit: Payer: Self-pay | Admitting: Hematology and Oncology

## 2012-05-03 ENCOUNTER — Telehealth: Payer: Self-pay | Admitting: Internal Medicine

## 2012-05-03 NOTE — Telephone Encounter (Signed)
Pt calling to set up appt for march with allred, hasn't done remote check in a long time, wants to know if/when she should be doing these, pls call [phone]

## 2012-05-03 NOTE — Telephone Encounter (Signed)
Left message for patient to send carelink transmission 05/10/12.

## 2012-05-10 ENCOUNTER — Encounter: Payer: Self-pay | Admitting: Internal Medicine

## 2012-05-10 ENCOUNTER — Ambulatory Visit (INDEPENDENT_AMBULATORY_CARE_PROVIDER_SITE_OTHER): Payer: Medicare Other | Admitting: *Deleted

## 2012-05-10 DIAGNOSIS — I441 Atrioventricular block, second degree: Secondary | ICD-10-CM | POA: Diagnosis not present

## 2012-05-10 DIAGNOSIS — Z950 Presence of cardiac pacemaker: Secondary | ICD-10-CM | POA: Diagnosis not present

## 2012-05-18 LAB — REMOTE PACEMAKER DEVICE
AL AMPLITUDE: 2.8 mv
AL IMPEDENCE PM: 470 Ohm
BAMS-0001: 175 {beats}/min
BATTERY VOLTAGE: 2.79 V
BRDY-0002RA: 60 {beats}/min

## 2012-05-24 ENCOUNTER — Telehealth: Payer: Self-pay

## 2012-05-24 NOTE — Telephone Encounter (Signed)
Pt is cancelling her semi-annual appt.12/10 due to weather and having daughter drive her so far. She is requesting reschedule in early January with Tues being the best day.

## 2012-05-25 ENCOUNTER — Other Ambulatory Visit: Payer: Medicare Other | Admitting: Lab

## 2012-05-25 ENCOUNTER — Ambulatory Visit: Payer: Medicare Other | Admitting: Hematology and Oncology

## 2012-05-26 ENCOUNTER — Encounter: Payer: Self-pay | Admitting: *Deleted

## 2012-06-18 DIAGNOSIS — H04229 Epiphora due to insufficient drainage, unspecified lacrimal gland: Secondary | ICD-10-CM | POA: Diagnosis not present

## 2012-06-18 DIAGNOSIS — H43819 Vitreous degeneration, unspecified eye: Secondary | ICD-10-CM | POA: Diagnosis not present

## 2012-07-15 ENCOUNTER — Encounter: Payer: Medicare Other | Admitting: Internal Medicine

## 2012-08-16 ENCOUNTER — Encounter: Payer: Medicare Other | Admitting: Internal Medicine

## 2012-08-27 ENCOUNTER — Ambulatory Visit (INDEPENDENT_AMBULATORY_CARE_PROVIDER_SITE_OTHER): Payer: Medicare Other | Admitting: Internal Medicine

## 2012-08-27 ENCOUNTER — Encounter: Payer: Self-pay | Admitting: Internal Medicine

## 2012-08-27 VITALS — BP 150/80 | HR 72 | Ht 62.0 in | Wt 124.0 lb

## 2012-08-27 DIAGNOSIS — I10 Essential (primary) hypertension: Secondary | ICD-10-CM | POA: Diagnosis not present

## 2012-08-27 DIAGNOSIS — I441 Atrioventricular block, second degree: Secondary | ICD-10-CM

## 2012-08-27 LAB — PACEMAKER DEVICE OBSERVATION
AL THRESHOLD: 0.5 V
BATTERY VOLTAGE: 2.78 V
RV LEAD THRESHOLD: 0.75 V

## 2012-08-27 NOTE — Assessment & Plan Note (Addendum)
Normal pacemaker function See [facility]rit[facility] Miss Art report No changes today  carelink every 3 months Return in 1year

## 2012-08-27 NOTE — Patient Instructions (Addendum)
Your physician wants you to follow-up in: 12 months with Hershal Coria You will receive a reminder letter in the mail two months in advance. If you don't receive a letter, please call our office to schedule the follow-up appointment.    Remote monitoring is used to monitor your Pacemaker of ICD from home. This monitoring reduces the number of office visits required to check your device to one time per year. It allows [place] to keep an eye on the functioning of your device to ensure it is working properly. You are scheduled for a device check from home on 11/29/12. You may send your transmission at any time that day. If you have a wireless device, the transmission will be sent automatically. After your physician reviews your transmission, you will receive a postcard with your next transmission date.

## 2012-08-27 NOTE — Progress Notes (Signed)
The patient presents today for routine electrophysiology followup.  Her syncope has resolved.  Today, she denies symptoms of palpitations, chest pain, shortness of breath,  lower extremity edema, dizziness, or further syncope.  The patient feels that she is tolerating medications without difficulties and is otherwise without complaint today.   Past Medical History  Diagnosis Date  . Complete heart block   . Hypercholesterolemia   . Polycythemia vera   . Syncope   . Hypertension   . Stroke    Past Surgical History  Procedure Laterality Date  . Pacemaker insertion  03/26/09    MDT [facility] DR, revised for RV lead fracture by Dr Deborah Chalk  . Hand surgery      S/P Carpal tunnel repair  . Appendectomy    . Transthoracic echocardiogram  03/26/2009  . US echocardiography  03/01/2009    EF 55-60%    Current Outpatient Prescriptions  Medication Sig Dispense Refill  . amLODipine (NORVASC) 5 MG tablet Take 5 mg by mouth daily.        Marland Kitchen aspirin 325 MG tablet Take 325 mg by mouth 2 (two) times daily.        . beta carotene w/minerals (OCUVITE) tablet Take 1 tablet by mouth daily.        . calcium-vitamin D (OSCAL WITH D) 500-200 MG-UNIT per tablet Take 2 tablets by mouth daily.        . fluticasone (FLONASE) 50 MCG/ACT nasal spray Place 1 spray into the nose 2 (two) times daily. 1 spray in EACH EYE BID       . hydroxyurea (HYDREA) 500 MG capsule 4 per week      . hydroxyurea (HYDREA) 500 MG capsule TAKE 1 CAPSULE EVERY DAY AS DIRECTED  90 capsule  1  . Polyethyl Glycol-Propyl Glycol (SYSTANE ULTRA OP) Apply 1 drop to eye 2 (two) times daily. Takes 1 drop in each eye  BID.       Marland Kitchen pyridOXINE (VITAMIN B-6) 100 MG tablet Take 100 mg by mouth daily.        . simvastatin (ZOCOR) 10 MG tablet Take 10 mg by mouth daily.        . vitamin B-12 (CYANOCOBALAMIN) 500 MCG tablet Take 500 mcg by mouth daily.         No current facility-administered medications for this visit.    No Known Allergies  History    Social History  . Marital Status: Widowed    Spouse Name: N/A    Number of Children: N/A  . Years of Education: N/A   Occupational History  . Not on file.   Social History Main Topics  . Smoking status: Never Smoker   . Smokeless tobacco: Not on file  . Alcohol Use: No  . Drug Use: No  . Sexually Active: Not on file   Other Topics Concern  . Not on file   Social History Narrative  . No narrative on file    Physical Exam: Filed Vitals:   08/27/12 1207  BP: 150/80  Pulse: 72  Height: 5\' 2"  (1.575 m)  Weight: 124 lb (56.246 kg)    GEN- The patient is well appearing, alert and oriented x 3 today.   Head- normocephalic, atraumatic Eyes-  Sclera clear, conjunctiva pink Ears- hearing intact Oropharynx- clear, small healing lip laceration Neck- supple, no JVP Lymph- no cervical lymphadenopathy Lungs- Clear to ausculation bilaterally, normal work of breathing Chest- pacemaker pocket is well healed Heart- Regular rate and rhythm, no murmurs,  rubs or gallops, PMI not laterally displaced GI- soft, NT, ND, + BS Extremities- no clubbing, cyanosis, or edema Neuro- strength and sensation are intact  Pacemaker interrogation- reviewed in detail today,  See PACEART report  Assessment and Plan:

## 2012-08-27 NOTE — Assessment & Plan Note (Signed)
Stable No change required today  

## 2012-09-28 DIAGNOSIS — Z23 Encounter for immunization: Secondary | ICD-10-CM | POA: Diagnosis not present

## 2012-09-28 DIAGNOSIS — I10 Essential (primary) hypertension: Secondary | ICD-10-CM | POA: Diagnosis not present

## 2012-09-28 DIAGNOSIS — E041 Nontoxic single thyroid nodule: Secondary | ICD-10-CM | POA: Diagnosis not present

## 2012-09-28 DIAGNOSIS — E785 Hyperlipidemia, unspecified: Secondary | ICD-10-CM | POA: Diagnosis not present

## 2012-09-28 DIAGNOSIS — D45 Polycythemia vera: Secondary | ICD-10-CM | POA: Diagnosis not present

## 2012-09-28 DIAGNOSIS — G479 Sleep disorder, unspecified: Secondary | ICD-10-CM | POA: Diagnosis not present

## 2012-09-29 ENCOUNTER — Telehealth: Payer: Self-pay | Admitting: Hematology & Oncology

## 2012-09-29 NOTE — Telephone Encounter (Signed)
Pt called was pt of Dr. Dalene Carrow said one of her MD's wanted her to she hematologist. She made 5-13 appointment to see Dr. Myna Hidalgo

## 2012-10-26 ENCOUNTER — Ambulatory Visit (HOSPITAL_BASED_OUTPATIENT_CLINIC_OR_DEPARTMENT_OTHER): Payer: Medicare Other | Admitting: Medical

## 2012-10-26 ENCOUNTER — Other Ambulatory Visit (HOSPITAL_BASED_OUTPATIENT_CLINIC_OR_DEPARTMENT_OTHER): Payer: Medicare Other | Admitting: Lab

## 2012-10-26 VITALS — BP 157/58 | HR 66 | Temp 97.9°F | Resp 16 | Ht 62.0 in | Wt 122.0 lb

## 2012-10-26 DIAGNOSIS — D45 Polycythemia vera: Secondary | ICD-10-CM

## 2012-10-26 LAB — CBC WITH DIFFERENTIAL (CANCER CENTER ONLY)
BASO#: 0.2 10*3/uL (ref 0.0–0.2)
Eosinophils Absolute: 0.3 10*3/uL (ref 0.0–0.5)
HCT: 58.6 % — ABNORMAL HIGH (ref 34.8–46.6)
HGB: 19 g/dL — ABNORMAL HIGH (ref 11.6–15.9)
LYMPH#: 1.5 10*3/uL (ref 0.9–3.3)
MONO#: 1.4 10*3/uL — ABNORMAL HIGH (ref 0.1–0.9)
NEUT%: 59 % (ref 39.6–80.0)
WBC: 8.1 10*3/uL (ref 3.9–10.0)

## 2012-10-26 LAB — BASIC METABOLIC PANEL
CO2: 26 mEq/L (ref 19–32)
Calcium: 9.8 mg/dL (ref 8.4–10.5)
Sodium: 142 mEq/L (ref 135–145)

## 2012-10-26 LAB — IRON AND TIBC: UIBC: 340 ug/dL (ref 125–400)

## 2012-10-26 NOTE — Progress Notes (Signed)
Diagnosis: Polycythemia vera, JAK2 positive.  Current therapy: Hydrea, 500 mg 4 times a week.  Interim history:[name] presents today as a transfer from [employer].  She was previously, following up with Dr. Dalene Carrow.  Airway is a pleasant, 79 year old elderly female, who has polycythemia vera, JAK2 positive.  It appears, that she has had phlebotomies in the past, however, I believe.  This is been quite some time ago.  She is currently on Hydrea, 500 mg 4 times a week.  She, states she's been on this regimen since December 2012.  In reviewing her lab work, her hemoglobin and hematocrit have been elevated since a year ago.  [name] states, that she is compliant with her Hydrea, however, she would prefer not to take it.  Overall, she is asymptomatic.  She does not complaining of any sweats, headaches, or flushing.  She does have a history of 2 CVAs.  She also has a pacemaker.  She's on aspirin 325 mg twice a day.  She's not reporting any neurologic symptoms.  I, believe we're going to have to set her up with weekly phlebotomies over the next 4 weeks.  We will have her stop the Hydrea for the time being.  She, reports, a decent, appetite.  She is extremely active.  She denies any nausea, vomiting, diarrhea, constipation, chest pain, shortness breath, or cough.  She denies any fevers, chills, or night sweats.  She denies any abdominal pain, any obvious, or abnormal bleeding.  She denies any headaches, visual changes, or rashes.    Review of Systems: Constitutional:Negative for malaise/fatigue, fever, chills, weight loss, diaphoresis, activity change, appetite change, and unexpected weight change.  HEENT: Negative for double vision, blurred vision, visual loss, ear pain, tinnitus, congestion, rhinorrhea, epistaxis sore throat or sinus disease, oral pain/lesion, tongue soreness Respiratory: Negative for cough, chest tightness, shortness of breath, wheezing and stridor.  Cardiovascular:  Negative for chest pain, palpitations, leg swelling, orthopnea, PND, DOE or claudication Gastrointestinal: Negative for nausea, vomiting, abdominal pain, diarrhea, constipation, blood in stool, melena, hematochezia, abdominal distention, anal bleeding, rectal pain, anorexia and hematemesis.  Genitourinary: Negative for dysuria, frequency, hematuria,  Musculoskeletal: Negative for myalgias, back pain, joint swelling, arthralgias and gait problem.  Skin: Negative for rash, color change, pallor and wound.  Neurological:. Negative for dizziness/light-headedness, tremors, seizures, syncope, facial asymmetry, speech difficulty, weakness, numbness, headaches and paresthesias.  Hematological: Negative for adenopathy. Does not bruise/bleed easily.  Psychiatric/Behavioral:  Negative for depression, no loss of interest in normal activity or change in sleep pattern.   Physical Exam:this is a pleasant, 79 year old, elderly, well-developed, well-nourished, white female, in no obvious distress  Vitals: temperature 97.9 degrees, pulse 66, respirations 16, blood pressure 157/58, weight 122 pounds  HEENT reveals a normocephalic, atraumatic skull, no scleral icterus, no oral lesions  Neck is supple without any cervical or supraclavicular adenopathy.  Lungs are clear to auscultation bilaterally. There are no wheezes, rales or rhonci Cardiac is regular rate and rhythm with a normal S1 and S2. There are no murmurs, rubs, or bruits.  Abdomen is soft with good bowel sounds, there is no palpable mass. There is no palpable hepatosplenomegaly. There is no palpable fluid wave.  Musculoskeletal no tenderness of the spine, ribs, or hips.  Extremities there are no clubbing, cyanosis, or edema.  Skin no petechia, purpura or ecchymosis Neurologic is nonfocal.  Laboratory Data:  white count 8.1, hemoglobin 19.0, hematocrit 58.6, platelets 370,000   Current Outpatient Prescriptions on File Prior to Visit  Medication Sig  Dispense Refill  . amLODipine (NORVASC) 5 MG tablet Take 5 mg by mouth daily.        Marland Kitchen aspirin 325 MG tablet Take 325 mg by mouth 2 (two) times daily.        . beta carotene w/minerals (OCUVITE) tablet Take 1 tablet by mouth daily.        . calcium-vitamin D (OSCAL WITH D) 500-200 MG-UNIT per tablet Take 2 tablets by mouth daily.        . fluticasone (FLONASE) 50 MCG/ACT nasal spray Place 1 spray into the nose 2 (two) times daily. 1 spray in EACH EYE BID       . hydroxyurea (HYDREA) 500 MG capsule 4 per week      . hydroxyurea (HYDREA) 500 MG capsule TAKE 1 CAPSULE EVERY DAY AS DIRECTED  90 capsule  1  . Polyethyl Glycol-Propyl Glycol (SYSTANE ULTRA OP) Apply 1 drop to eye 2 (two) times daily. Takes 1 drop in each eye  BID.       Marland Kitchen pyridOXINE (VITAMIN B-6) 100 MG tablet Take 100 mg by mouth daily.        . simvastatin (ZOCOR) 10 MG tablet Take 10 mg by mouth daily.        . vitamin B-12 (CYANOCOBALAMIN) 500 MCG tablet Take 500 mcg by mouth daily.         No current facility-administered medications on file prior to visit.   Assessment/Plan: this is a pleasant, 79 year old, white, female, with the following issues:  #1.  Polycythemia vera, JAK2 positive.  She was diagnosed in 2008.  She is currently on Hydrea, 500 mg 4 times a week.  She, apparently, has been on this regimen for quite some time.  Looking back, her hemoglobin and hematocrit have been quite elevated.  We are going to go ahead and have to phlebotomize her weekly x4 weeks.  We are also checking iron studies on her to see if she is iron deficient.  She is on aspirin 325 mg twice a day.  She does have a pacemaker and history of 2 CVA's. She continues to follow up with Dr. Johney Frame.   #2.  Followup.  [name] will follow back up with [place] in about one month, but before then should there be questions or concerns.

## 2012-10-27 ENCOUNTER — Ambulatory Visit (HOSPITAL_BASED_OUTPATIENT_CLINIC_OR_DEPARTMENT_OTHER): Payer: Medicare Other

## 2012-10-27 ENCOUNTER — Other Ambulatory Visit: Payer: Self-pay | Admitting: *Deleted

## 2012-10-27 VITALS — BP 172/69 | HR 68 | Temp 97.0°F | Resp 20

## 2012-10-27 DIAGNOSIS — D45 Polycythemia vera: Secondary | ICD-10-CM | POA: Diagnosis not present

## 2012-10-27 DIAGNOSIS — D751 Secondary polycythemia: Secondary | ICD-10-CM

## 2012-10-27 NOTE — Patient Instructions (Signed)
Therapeutic Phlebotomy Therapeutic phlebotomy is the controlled removal of blood from your body for the purpose of treating a medical condition. It is similar to donating blood. Usually, about a pint (470 mL) of blood is removed. The average adult has 9 to 12 pints (4.3 to 5.7 L) of blood. Therapeutic phlebotomy may be used to treat the following medical conditions:  Hemochromatosis. This is a condition in which there is too much iron in the blood.  Polycythemia vera. This is a condition in which there are too many red cells in the blood.  Porphyria cutanea tarda. This is a disease usually passed from one generation to the next (inherited). It is a condition in which an important part of hemoglobin is not made properly. This results in the build up of abnormal amounts of porphyrins in the body.  Sickle cell disease. This is an inherited disease. It is a condition in which the red blood cells form an abnormal crescent shape rather than a round shape. LET YOUR CAREGIVER KNOW ABOUT:  Allergies.  Medicines taken including herbs, eyedrops, over-the-counter medicines, and creams.  Use of steroids (by mouth or creams).  Previous problems with anesthetics or numbing medicine.  History of blood clots.  History of bleeding or blood problems.  Previous surgery.  Possibility of pregnancy, if this applies. RISKS AND COMPLICATIONS This is a simple and safe procedure. Problems are unlikely. However, problems can occur and may include:  Nausea or lightheadedness.  Low blood pressure.  Soreness, bleeding, swelling, or bruising at the needle insertion site.  Infection. BEFORE THE PROCEDURE  This is a procedure that can be done as an outpatient. Confirm the time that you need to arrive for your procedure. Confirm whether there is a need to fast or withhold any medications. It is helpful to wear clothing with sleeves that can be raised above the elbow. A blood sample may be done to determine the  amount of red blood cells or iron in your blood. Plan ahead of time to have someone drive you home after the procedure. PROCEDURE The entire procedure from preparation through recovery takes about 1 hour. The actual collection takes about 10 to 15 minutes.  A needle will be inserted into your vein.  Tubing and a collection bag will be attached to that needle.  Blood will flow through the needle and tubing into the collection bag.  You may be asked to open and close your hand slowly and continuously during the entire collection.  Once the specified amount of blood has been removed from your body, the collection bag and tubing will be clamped.  The needle will be removed.  Pressure will be held on the site of the needle insertion to stop the bleeding. Then a bandage will be placed over the needle insertion site. AFTER THE PROCEDURE  Your recovery will be assessed and monitored. If there are no problems, as an outpatient, you should be able to go home shortly after the procedure.  Document Released: 11/04/2010 Document Revised: 08/25/2011 Document Reviewed: 11/04/2010 [facility] Patient Information 2013 [place], [place]. Therapeutic Phlebotomy Care After Refer to this sheet in the next few weeks. These instructions provide you with information on caring for yourself after your procedure. Your caregiver may also give you more specific instructions. Your treatment has been planned according to current medical practices, but problems sometimes occur. Call your caregiver if you have any problems or questions after your procedure. HOME CARE INSTRUCTIONS Most people can go back to their normal activities  right away. Before you leave, be sure to ask if there is anything you should or should not do. In general, it would be wise to:  Keep the bandage dry. You can remove the bandage after about 5 hours.  Eat well-balanced meals for the next 24 hours.  Drink enough fluids to keep your urine clear or  pale yellow.  Avoid drinking alcohol minimally until after eating.  Avoid smoking for at least 30 minutes after the procedure.  Avoid strenous physical activity or heavy lifting or pulling for about 5 hours after the procedure.  Athletes should avoid strenous exercise for 12 hours or more.  Change positions slowly for the remainder of the day to prevent lightheadedness or fainting.  If you feel lightheaded, lie down until the feeling subsides.  If you have bleeding from the needle insertion site, elevate your arm and press firmly on the site until the bleeding stops.  If bruising or bleeding appears under the skin, apply ice to the area for 15 to 20 minutes, 3 to 4 times per day. Put the ice in a plastic bag and place a towel between the bag of ice and your skin. Do this while you are awake for the first 24 hours. The ice packs can be stopped before 24 hours if the swelling goes away. If swelling persists after 24 hours, a warm, moist washcloth can be applied to the area for 15 to 20 minutes, 3 to 4 times per day. The warm, moist treatments can be stopped when the swelling goes away.  It is important to continue further therapeutic phlebotomy as directed by your caregiver. SEEK MEDICAL CARE IF:  There is bleeding or fluid leaking from the needle insertion site.  The needle insertion site becomes swollen, red, or sore.  You feel lightheaded, dizzy or nauseated, and the feeling does not go away.  You notice new bruising at the needle insertion site.  You feel more weak or tired than normal.  You develop a fever. SEEK IMMEDIATE MEDICAL CARE IF:   There is increased bleeding, pain, or swelling from the needle insertion site.  You have severe nausea or vomiting.  You have chest pain.  You have trouble breathing. MAKE SURE YOU:  Understand these instructions.  Will watch your condition.  Will get help right away if you are not doing well or get worse. Document Released:  11/04/2010 Document Revised: 08/25/2011 Document Reviewed: 11/04/2010 [facility] Patient Information 2013 [place], [place].

## 2012-11-02 ENCOUNTER — Ambulatory Visit (HOSPITAL_BASED_OUTPATIENT_CLINIC_OR_DEPARTMENT_OTHER): Payer: Medicare Other

## 2012-11-02 ENCOUNTER — Encounter: Payer: Self-pay | Admitting: Cardiology

## 2012-11-02 VITALS — BP 157/82 | HR 63 | Temp 98.1°F

## 2012-11-02 DIAGNOSIS — D45 Polycythemia vera: Secondary | ICD-10-CM | POA: Diagnosis not present

## 2012-11-02 DIAGNOSIS — D751 Secondary polycythemia: Secondary | ICD-10-CM

## 2012-11-02 NOTE — Progress Notes (Signed)
Per Eunice Blase, PA - verbal order received ok to perform phlebotomy despite BP, and have pt take BP med as scheduled.  Pt takes amlodipine 5 mg at night.  Informed pt of this, and she verbalizes understanding.

## 2012-11-09 ENCOUNTER — Ambulatory Visit (HOSPITAL_BASED_OUTPATIENT_CLINIC_OR_DEPARTMENT_OTHER): Payer: Medicare Other

## 2012-11-09 ENCOUNTER — Other Ambulatory Visit: Payer: Medicare Other | Admitting: Lab

## 2012-11-09 ENCOUNTER — Ambulatory Visit: Payer: Medicare Other | Admitting: Medical

## 2012-11-09 VITALS — BP 137/66 | HR 61 | Temp 97.3°F | Resp 20

## 2012-11-09 DIAGNOSIS — D45 Polycythemia vera: Secondary | ICD-10-CM

## 2012-11-09 DIAGNOSIS — D751 Secondary polycythemia: Secondary | ICD-10-CM

## 2012-11-09 NOTE — Progress Notes (Signed)
[name] presents today for phlebotomy per MD orders. Phlebotomy procedure started at 1408 and ended at 1421. 300 grams removed. Patient observed for 30 minutes after procedure without any incident. Patient tolerated procedure well. IV needle removed intact. Patient only had 300 ml of blood removed because needed clotted.

## 2012-11-09 NOTE — Patient Instructions (Signed)

## 2012-11-16 ENCOUNTER — Ambulatory Visit (HOSPITAL_BASED_OUTPATIENT_CLINIC_OR_DEPARTMENT_OTHER): Payer: Medicare Other

## 2012-11-16 VITALS — BP 162/71 | HR 73 | Temp 98.5°F | Resp 20

## 2012-11-16 DIAGNOSIS — D45 Polycythemia vera: Secondary | ICD-10-CM

## 2012-11-16 DIAGNOSIS — D751 Secondary polycythemia: Secondary | ICD-10-CM

## 2012-11-23 ENCOUNTER — Other Ambulatory Visit (HOSPITAL_BASED_OUTPATIENT_CLINIC_OR_DEPARTMENT_OTHER): Payer: Medicare Other | Admitting: Lab

## 2012-11-23 ENCOUNTER — Ambulatory Visit (HOSPITAL_BASED_OUTPATIENT_CLINIC_OR_DEPARTMENT_OTHER): Payer: Medicare Other | Admitting: Hematology & Oncology

## 2012-11-23 VITALS — BP 165/69 | HR 60 | Temp 97.7°F | Resp 16 | Ht 62.0 in | Wt 123.0 lb

## 2012-11-23 DIAGNOSIS — D751 Secondary polycythemia: Secondary | ICD-10-CM

## 2012-11-23 DIAGNOSIS — D45 Polycythemia vera: Secondary | ICD-10-CM

## 2012-11-23 LAB — CBC WITH DIFFERENTIAL (CANCER CENTER ONLY)
BASO%: 1.7 % (ref 0.0–2.0)
Eosinophils Absolute: 0.4 10*3/uL (ref 0.0–0.5)
HCT: 46.6 % (ref 34.8–46.6)
LYMPH#: 2 10*3/uL (ref 0.9–3.3)
MONO#: 1.7 10*3/uL — ABNORMAL HIGH (ref 0.1–0.9)
NEUT%: 59.6 % (ref 39.6–80.0)
RBC: 5.53 10*6/uL — ABNORMAL HIGH (ref 3.70–5.32)
RDW: 18 % — ABNORMAL HIGH (ref 11.1–15.7)
WBC: 10.6 10*3/uL — ABNORMAL HIGH (ref 3.9–10.0)

## 2012-11-23 LAB — IRON AND TIBC
%SAT: 4 % — ABNORMAL LOW (ref 20–55)
TIBC: 406 ug/dL (ref 250–470)
UIBC: 388 ug/dL (ref 125–400)

## 2012-11-23 LAB — FERRITIN: Ferritin: 8 ng/mL — ABNORMAL LOW (ref 10–291)

## 2012-11-23 NOTE — Progress Notes (Signed)
This office note has been dictated.

## 2012-11-23 NOTE — Progress Notes (Signed)
CC:   [name]. [name], M.D. Karen Range, MD  DIAGNOSIS:  Polycythemia vera, JAK2-positive.  CURRENT THERAPY: 1. Phlebotomy to maintain hematocrit below 45%. 2. Hydrea 500 mg p.o. daily.  INTERIM HISTORY:  [name] comes in for followup.  We last saw her back in May.  At that point in time, her hematocrit is 58.6.  We went ahead and phlebotomized her.  She stopped her Hydrea.  Her platelet count was 370,000 at that time. She is only taking Hydrea 4 times a week.  In fact, she feels okay.  She just does feel a little bit tired.  The ferritin we checked back in May was 914.  She has had no headache.  There has been no nausea or vomiting.  She takes two 325 mg aspirin a day.  She did have a stroke in the past. I think she probably could go down one full-dose aspirin a day.  She does have some cardiac issues.  She does see Cardiology.  She does have a pacemaker in place.  PHYSICAL EXAMINATION:  General:  This is a petite, elderly white female in no obvious distress.  Vital signs:  Temperature of 97.7, pulse 60, respiratory rate 16, blood pressure 165/69.  Weight is 123.  Head/neck: Normocephalic, atraumatic skull.  There are no ocular or oral lesions. There are no palpable cervical or supraclavicular lymph nodes.  Lungs: Clear bilaterally.  Cardiac:  Regular rate and rhythm with a normal S1, S2.  There are no murmurs, rubs or bruits.  Abdomen:  Soft with good bowel sounds.  There is no palpable abdominal mass.  There is no palpable hepatosplenomegaly.  Extremities:  Osteoarthritic changes in her joints.  She has good Wells of motion of her joints.  She has good strength bilaterally.  Skin:  No rashes, ecchymosis or petechia.  LABORATORY STUDIES:  White cell count of 10.6, hemoglobin 14.8, hematocrit 46.6, platelet count 676.  MCV is 84.  IMPRESSION:  [name] is a very charming 79 year old white female with polycythemia.  We will hold off on her phlebotomy for right  now.  She would prefer not to be phlebotomized.  I think we are okay on this.  I do want to get her back on Hydrea.  She was taken off Hydrea.  The platelet count clearly is going back up.  We will get her back to see [place] in another month so that we can keep monitoring her blood counts.  I suspect that when we see her back, she may need to be phlebotomized.    ______________________________ [name], M.D. PRE/MEDQ  D:  11/23/2012  T:  11/23/2012  Job:  [id number]

## 2012-11-24 ENCOUNTER — Telehealth: Payer: Self-pay | Admitting: Internal Medicine

## 2012-11-24 NOTE — Telephone Encounter (Signed)
Spoke w/pt and instructed on how to send transmission. Pt voices understanding and was instructed to call if any problems.

## 2012-11-24 NOTE — Telephone Encounter (Signed)
New problem  Pt wants to know how to do her device check for Monday.

## 2012-11-29 ENCOUNTER — Ambulatory Visit (INDEPENDENT_AMBULATORY_CARE_PROVIDER_SITE_OTHER): Payer: Medicare Other | Admitting: *Deleted

## 2012-11-29 DIAGNOSIS — Z950 Presence of cardiac pacemaker: Secondary | ICD-10-CM

## 2012-11-29 DIAGNOSIS — I441 Atrioventricular block, second degree: Secondary | ICD-10-CM | POA: Diagnosis not present

## 2012-12-05 LAB — REMOTE PACEMAKER DEVICE
AL IMPEDENCE PM: 412 Ohm
ATRIAL PACING PM: 50
BAMS-0001: 175 {beats}/min
RV LEAD IMPEDENCE PM: 607 Ohm
VENTRICULAR PACING PM: 99

## 2012-12-13 ENCOUNTER — Encounter: Payer: Self-pay | Admitting: *Deleted

## 2012-12-16 ENCOUNTER — Encounter: Payer: Self-pay | Admitting: Internal Medicine

## 2012-12-22 ENCOUNTER — Ambulatory Visit (HOSPITAL_BASED_OUTPATIENT_CLINIC_OR_DEPARTMENT_OTHER): Payer: Medicare Other

## 2012-12-22 ENCOUNTER — Ambulatory Visit (HOSPITAL_BASED_OUTPATIENT_CLINIC_OR_DEPARTMENT_OTHER): Payer: Medicare Other | Admitting: Hematology & Oncology

## 2012-12-22 ENCOUNTER — Other Ambulatory Visit (HOSPITAL_BASED_OUTPATIENT_CLINIC_OR_DEPARTMENT_OTHER): Payer: Medicare Other | Admitting: Lab

## 2012-12-22 ENCOUNTER — Other Ambulatory Visit: Payer: Self-pay | Admitting: Hematology and Oncology

## 2012-12-22 VITALS — BP 145/70 | HR 65 | Temp 97.7°F | Resp 16 | Ht 62.0 in | Wt 122.0 lb

## 2012-12-22 DIAGNOSIS — D45 Polycythemia vera: Secondary | ICD-10-CM

## 2012-12-22 DIAGNOSIS — D751 Secondary polycythemia: Secondary | ICD-10-CM

## 2012-12-22 LAB — CBC WITH DIFFERENTIAL (CANCER CENTER ONLY)
BASO%: 1.3 % (ref 0.0–2.0)
LYMPH#: 1.7 10*3/uL (ref 0.9–3.3)
LYMPH%: 20 % (ref 14.0–48.0)
MONO#: 1.1 10*3/uL — ABNORMAL HIGH (ref 0.1–0.9)
NEUT#: 5.3 10*3/uL (ref 1.5–6.5)
Platelets: 121 10*3/uL — ABNORMAL LOW (ref 145–400)
RDW: 20.2 % — ABNORMAL HIGH (ref 11.1–15.7)
WBC: 8.4 10*3/uL (ref 3.9–10.0)

## 2012-12-22 NOTE — Progress Notes (Signed)
[name] presents today for phlebotomy per MD orders. Phlebotomy procedure started at 1233  and ended at 1247. 500 grams removed. Patient observed for 30 minutes after procedure without any incident. Patient tolerated procedure well. IV needle removed intact.

## 2012-12-22 NOTE — Patient Instructions (Addendum)
Therapeutic Phlebotomy Therapeutic phlebotomy is the controlled removal of blood from your body for the purpose of treating a medical condition. It is similar to donating blood. Usually, about a pint (470 mL) of blood is removed. The average adult has 9 to 12 pints (4.3 to 5.7 L) of blood. Therapeutic phlebotomy may be used to treat the following medical conditions:  Hemochromatosis. This is a condition in which there is too much iron in the blood.  Polycythemia vera. This is a condition in which there are too many red cells in the blood.  Porphyria cutanea tarda. This is a disease usually passed from one generation to the next (inherited). It is a condition in which an important part of hemoglobin is not made properly. This results in the build up of abnormal amounts of porphyrins in the body.  Sickle cell disease. This is an inherited disease. It is a condition in which the red blood cells form an abnormal crescent shape rather than a round shape. LET YOUR CAREGIVER KNOW ABOUT:  Allergies.  Medicines taken including herbs, eyedrops, over-the-counter medicines, and creams.  Use of steroids (by mouth or creams).  Previous problems with anesthetics or numbing medicine.  History of blood clots.  History of bleeding or blood problems.  Previous surgery.  Possibility of pregnancy, if this applies. RISKS AND COMPLICATIONS This is a simple and safe procedure. Problems are unlikely. However, problems can occur and may include:  Nausea or lightheadedness.  Low blood pressure.  Soreness, bleeding, swelling, or bruising at the needle insertion site.  Infection. BEFORE THE PROCEDURE  This is a procedure that can be done as an outpatient. Confirm the time that you need to arrive for your procedure. Confirm whether there is a need to fast or withhold any medications. It is helpful to wear clothing with sleeves that can be raised above the elbow. A blood sample may be done to determine the  amount of red blood cells or iron in your blood. Plan ahead of time to have someone drive you home after the procedure. PROCEDURE The entire procedure from preparation through recovery takes about 1 hour. The actual collection takes about 10 to 15 minutes.  A needle will be inserted into your vein.  Tubing and a collection bag will be attached to that needle.  Blood will flow through the needle and tubing into the collection bag.  You may be asked to open and close your hand slowly and continuously during the entire collection.  Once the specified amount of blood has been removed from your body, the collection bag and tubing will be clamped.  The needle will be removed.  Pressure will be held on the site of the needle insertion to stop the bleeding. Then a bandage will be placed over the needle insertion site. AFTER THE PROCEDURE  Your recovery will be assessed and monitored. If there are no problems, as an outpatient, you should be able to go home shortly after the procedure.  Document Released: 11/04/2010 Document Revised: 08/25/2011 Document Reviewed: 11/04/2010 [employer] Patient Information 2014 [employer].  

## 2012-12-22 NOTE — Progress Notes (Signed)
This office note has been dictated.

## 2012-12-23 NOTE — Progress Notes (Signed)
CC:   [name]. [name], M.D. Karen Range, MD  DIAGNOSIS:  Polycythemia vera, JAK2 positive.  CURRENT THERAPY: 1. Phlebotomy to maintain hematocrit below 45%. 2. Hydrea 500 mg p.o. every other day. 3. Aspirin 325 mg p.o. daily.  INTERIM HISTORY:  [name] comes in for followup.  She is doing okay.  She is bruising a little bit more.  she has had no bleeding.  She has had no nausea or vomiting.  There has been no cough or shortness of breath.  There has been no leg swelling.  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  She __________ a pacemaker in place.  Cardiology just evaluated this. Everything seemed to be okay.  PHYSICAL EXAMINATION:  General:  This is an elderly but well-nourished white female in no obvious distress.  Vital signs:  Temperature 97.7, pulse 65, respiratory rate 16, blood pressure 145/70.  Weight is 122. Head and neck:  Normocephalic, atraumatic skull.  There are no ocular or oral lesions.  There are no palpable cervical or supraclavicular lymph nodes.  Lungs:  Clear bilaterally.  Cardiac:  Regular rate and rhythm with a normal S1 and S2.  There are no murmurs, rubs or bruits. Abdomen:  Soft with good bowel sounds.  There is no palpable abdominal mass.  There is no palpable hepatosplenomegaly.  Extremities:  Show no clubbing, cyanosis or edema.  Neurological:  Shows no focal neurological deficits.  Skin:  Shows some scattered ecchymoses.  LABORATORY STUDIES:  White cell count is 8.4, hemoglobin 15.9, hematocrit 50.4, platelet count is 121.  IMPRESSION:  [name] is a very charming 79 year old white female with polycythemia vera.  we will go ahead and phlebotomize her today.  Again, we will cut her Hydrea dose back to every-other-day dosing.  She is on the aspirin at 325 mg.  She has had a stroke in the past.  We will plan to get her back after Labor Day now.  I think we can probably go 2  months.    ______________________________ Josph Macho, M.D. PRE/MEDQ  D:  12/22/2012  T:  12/23/2012  Job:  [id number]

## 2013-02-21 DIAGNOSIS — H11439 Conjunctival hyperemia, unspecified eye: Secondary | ICD-10-CM | POA: Diagnosis not present

## 2013-02-21 DIAGNOSIS — M2420 Disorder of ligament, unspecified site: Secondary | ICD-10-CM | POA: Diagnosis not present

## 2013-02-21 DIAGNOSIS — H04129 Dry eye syndrome of unspecified lacrimal gland: Secondary | ICD-10-CM | POA: Diagnosis not present

## 2013-02-21 DIAGNOSIS — H04229 Epiphora due to insufficient drainage, unspecified lacrimal gland: Secondary | ICD-10-CM | POA: Diagnosis not present

## 2013-02-21 DIAGNOSIS — H16219 Exposure keratoconjunctivitis, unspecified eye: Secondary | ICD-10-CM | POA: Diagnosis not present

## 2013-02-24 ENCOUNTER — Other Ambulatory Visit (HOSPITAL_BASED_OUTPATIENT_CLINIC_OR_DEPARTMENT_OTHER): Payer: Medicare Other | Admitting: Lab

## 2013-02-24 ENCOUNTER — Ambulatory Visit: Payer: Medicare Other

## 2013-02-24 ENCOUNTER — Ambulatory Visit (HOSPITAL_BASED_OUTPATIENT_CLINIC_OR_DEPARTMENT_OTHER): Payer: Medicare Other | Admitting: Hematology & Oncology

## 2013-02-24 VITALS — BP 84/53 | HR 68 | Temp 98.2°F | Resp 18 | Wt 123.0 lb

## 2013-02-24 DIAGNOSIS — D751 Secondary polycythemia: Secondary | ICD-10-CM

## 2013-02-24 DIAGNOSIS — D45 Polycythemia vera: Secondary | ICD-10-CM

## 2013-02-24 LAB — CBC WITH DIFFERENTIAL (CANCER CENTER ONLY)
BASO%: 1.3 % (ref 0.0–2.0)
HCT: 46.6 % (ref 34.8–46.6)
HGB: 14.4 g/dL (ref 11.6–15.9)
LYMPH#: 1.5 10*3/uL (ref 0.9–3.3)
MONO#: 1.8 10*3/uL — ABNORMAL HIGH (ref 0.1–0.9)
NEUT%: 62.6 % (ref 39.6–80.0)
RDW: 20.6 % — ABNORMAL HIGH (ref 11.1–15.7)
WBC: 10.1 10*3/uL — ABNORMAL HIGH (ref 3.9–10.0)

## 2013-02-24 NOTE — Progress Notes (Signed)
This office note has been dictated.

## 2013-02-24 NOTE — Progress Notes (Signed)
No phlebotomy today per dr. ennever 

## 2013-02-25 NOTE — Progress Notes (Signed)
CC:   [name]. [name], M.D. Karen Range, MD  DIAGNOSIS:  Polycythemia vera, JAK2 positive.  CURRENT THERAPY: 1. Hydrea 500 mg p.o. every other day--patient to stop this for right     now. 2. Aspirin 325 mg p.o. daily. 3. Phlebotomy to maintain hematocrit below 45%.  INTERIM HISTORY:  [name] comes in for followup.  She is doing fairly well.  She is still volunteering.  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  She says that her speech does get slurred a little bit.  She does have a history of past CVA.  She has had no bleeding.  There has been no bruising.  She has had no change in bowel or bladder habits.  She has had no cough or shortness of breath.  There has been no nausea or vomiting.  She has had no swallowing difficulties.  Her pacemaker is in place.  This is doing okay, according to Cardiology.  Overall, her performance status is ECOG 1.  PHYSICAL EXAMINATION:  General:  This is an elderly white female in no obvious distress.  Vital Signs:  Temperature of 98.2, pulse 68, respiratory rate 18, blood pressure 84/53, rechecked was 141/51.  Weight was 123.  Head and Neck:  Normocephalic, atraumatic skull.  She has no ocular or oral lesions.  There are no palpable cervical or supraclavicular lymph nodes.  Lungs:  Clear bilaterally.  Cardiac: Regular rate and rhythm with a normal S1 and S2.  There are no murmurs, rubs or bruits.  Abdomen:  Soft.  She has good bowel sounds.  There is no fluid wave.  There is no palpable hepatosplenomegaly.  Extremities: No clubbing, cyanosis or edema.  She has age-related osteoarthritic changes in her joints.  Muscle strength is adequate bilaterally. Neurological:  No focal neurological deficits.  Skin:  Some scattered ecchymoses.  LABORATORY STUDIES:  White cell count is 10.1, hemoglobin 14.4, hematocrit 46.6, platelet count 76,000.  MCV is 82.  Peripheral blood smear was reviewed.  I did not see  any blasts. Platelets were decreased in number.  Platelets were well granulated. There were no nucleated red blood cells.  I saw no teardrop cells. There are no schistocytes.  IMPRESSION:  [name] is an 79 year old female with polycythemia.  Her platelet is count is dropping.  I told her to stop the Hydrea today.  One thing we are going to have to worry about is the possibility of a "burned out" marrow.  One would think that this could be possible with polycythemia.  I do not see anything on the blood smear that looked suspicious, however.  I do want to repeat her blood work in 2 or 3 weeks.  I want see her back in about 6 weeks' time.  If we find out her platelet count does not respond to stopping the Hydrea, then we are going to have to consider a bone marrow test.    ______________________________ Josph Macho, M.D. PRE/MEDQ  D:  02/24/2013  T:  02/25/2013  Job:  [id number]

## 2013-03-14 ENCOUNTER — Ambulatory Visit (INDEPENDENT_AMBULATORY_CARE_PROVIDER_SITE_OTHER): Payer: Medicare Other | Admitting: *Deleted

## 2013-03-14 DIAGNOSIS — I441 Atrioventricular block, second degree: Secondary | ICD-10-CM

## 2013-03-14 LAB — REMOTE PACEMAKER DEVICE
AL AMPLITUDE: 2.8 mv
AL IMPEDENCE PM: 412 Ohm
BAMS-0001: 175 {beats}/min
BATTERY VOLTAGE: 2.79 V
RV LEAD IMPEDENCE PM: 540 Ohm

## 2013-03-15 DIAGNOSIS — M545 Low back pain: Secondary | ICD-10-CM | POA: Diagnosis not present

## 2013-03-15 DIAGNOSIS — M25559 Pain in unspecified hip: Secondary | ICD-10-CM | POA: Diagnosis not present

## 2013-03-16 ENCOUNTER — Encounter: Payer: Self-pay | Admitting: *Deleted

## 2013-03-17 ENCOUNTER — Other Ambulatory Visit (HOSPITAL_BASED_OUTPATIENT_CLINIC_OR_DEPARTMENT_OTHER): Payer: Medicare Other | Admitting: Lab

## 2013-03-17 ENCOUNTER — Other Ambulatory Visit: Payer: Self-pay | Admitting: Orthopaedic Surgery

## 2013-03-17 DIAGNOSIS — D45 Polycythemia vera: Secondary | ICD-10-CM

## 2013-03-17 DIAGNOSIS — S3210XA Unspecified fracture of sacrum, initial encounter for closed fracture: Secondary | ICD-10-CM

## 2013-03-17 DIAGNOSIS — D751 Secondary polycythemia: Secondary | ICD-10-CM

## 2013-03-17 DIAGNOSIS — M545 Low back pain: Secondary | ICD-10-CM

## 2013-03-17 DIAGNOSIS — R52 Pain, unspecified: Secondary | ICD-10-CM

## 2013-03-17 DIAGNOSIS — R102 Pelvic and perineal pain: Secondary | ICD-10-CM

## 2013-03-17 DIAGNOSIS — M5418 Radiculopathy, sacral and sacrococcygeal region: Secondary | ICD-10-CM

## 2013-03-17 LAB — TECHNOLOGIST REVIEW

## 2013-03-17 LAB — CBC WITH DIFFERENTIAL/PLATELET
Eosinophils Absolute: 0.4 10*3/uL (ref 0.0–0.5)
MCV: 80.2 fL (ref 79.5–101.0)
MONO#: 2.7 10*3/uL — ABNORMAL HIGH (ref 0.1–0.9)
MONO%: 16.1 % — ABNORMAL HIGH (ref 0.0–14.0)
NEUT#: 11.4 10*3/uL — ABNORMAL HIGH (ref 1.5–6.5)
RBC: 5.51 10*6/uL — ABNORMAL HIGH (ref 3.70–5.45)
RDW: 19.2 % — ABNORMAL HIGH (ref 11.2–14.5)
WBC: 16.5 10*3/uL — ABNORMAL HIGH (ref 3.9–10.3)
nRBC: 0 % (ref 0–0)

## 2013-03-17 LAB — CHCC SMEAR

## 2013-03-22 ENCOUNTER — Ambulatory Visit
Admission: RE | Admit: 2013-03-22 | Discharge: 2013-03-22 | Disposition: A | Discharge status: Home / Self Care | Payer: Medicare Other | Source: Ambulatory Visit | Attending: Orthopaedic Surgery | Admitting: Orthopaedic Surgery

## 2013-03-22 DIAGNOSIS — M169 Osteoarthritis of hip, unspecified: Secondary | ICD-10-CM | POA: Diagnosis not present

## 2013-03-22 DIAGNOSIS — R102 Pelvic and perineal pain: Secondary | ICD-10-CM

## 2013-03-22 DIAGNOSIS — M545 Low back pain, unspecified: Secondary | ICD-10-CM

## 2013-03-22 DIAGNOSIS — M48061 Spinal stenosis, lumbar region without neurogenic claudication: Secondary | ICD-10-CM | POA: Diagnosis not present

## 2013-03-22 DIAGNOSIS — R52 Pain, unspecified: Secondary | ICD-10-CM

## 2013-03-24 ENCOUNTER — Encounter: Payer: Self-pay | Admitting: Internal Medicine

## 2013-04-04 DIAGNOSIS — M25559 Pain in unspecified hip: Secondary | ICD-10-CM | POA: Diagnosis not present

## 2013-04-04 DIAGNOSIS — M545 Low back pain: Secondary | ICD-10-CM | POA: Diagnosis not present

## 2013-04-14 ENCOUNTER — Other Ambulatory Visit (HOSPITAL_BASED_OUTPATIENT_CLINIC_OR_DEPARTMENT_OTHER): Payer: Medicare Other | Admitting: Lab

## 2013-04-14 ENCOUNTER — Ambulatory Visit (HOSPITAL_BASED_OUTPATIENT_CLINIC_OR_DEPARTMENT_OTHER): Payer: Medicare Other | Admitting: Hematology & Oncology

## 2013-04-14 VITALS — BP 145/50 | HR 68 | Temp 98.0°F | Resp 14 | Ht 62.0 in | Wt 126.0 lb

## 2013-04-14 DIAGNOSIS — D751 Secondary polycythemia: Secondary | ICD-10-CM

## 2013-04-14 DIAGNOSIS — D45 Polycythemia vera: Secondary | ICD-10-CM

## 2013-04-14 LAB — CBC WITH DIFFERENTIAL (CANCER CENTER ONLY)
BASO#: 0.2 10*3/uL (ref 0.0–0.2)
BASO%: 1.9 % (ref 0.0–2.0)
EOS%: 4.7 % (ref 0.0–7.0)
HCT: 45.7 % (ref 34.8–46.6)
HGB: 14 g/dL (ref 11.6–15.9)
LYMPH#: 1.6 10*3/uL (ref 0.9–3.3)
MCHC: 30.6 g/dL — ABNORMAL LOW (ref 32.0–36.0)
MONO#: 2.1 10*3/uL — ABNORMAL HIGH (ref 0.1–0.9)
MONO%: 20.1 % — ABNORMAL HIGH (ref 0.0–13.0)
NEUT#: 5.9 10*3/uL (ref 1.5–6.5)
RDW: 19.3 % — ABNORMAL HIGH (ref 11.1–15.7)

## 2013-04-14 NOTE — Progress Notes (Signed)
This office note has been dictated.

## 2013-04-15 DIAGNOSIS — H52209 Unspecified astigmatism, unspecified eye: Secondary | ICD-10-CM | POA: Diagnosis not present

## 2013-04-15 DIAGNOSIS — Z961 Presence of intraocular lens: Secondary | ICD-10-CM | POA: Diagnosis not present

## 2013-04-15 DIAGNOSIS — H04229 Epiphora due to insufficient drainage, unspecified lacrimal gland: Secondary | ICD-10-CM | POA: Diagnosis not present

## 2013-04-15 NOTE — Progress Notes (Signed)
CC:   Stacie Acres. Cliffton Asters, M.D. Hillis Range, MD  DIAGNOSIS:  Polycythemia vera -- JAK2 positive.  CURRENT THERAPY: 1. Hydrea 500 mg p.o. q. week. 2. Aspirin 325 mg p.o. q. day. 3. Phlebotomy to maintain hematocrit below 45%.  INTERIM HISTORY:  [name] comes in for her followup.  She is doing quite well.  She has been off the Hydrea now.  Her platelet count had gotten down quite low.  We, I think, can get her back on the Hydrea.  She has had no issues with headache.  There has been no cough or shortness of breath.  She has had no bony pain.  There has been no nausea or vomiting.  She has had no rashes.  Overall, her performance status is ECOG 2.  PHYSICAL EXAM:  General:  This is a well-developed, well-nourished white female in no obvious distress.  Vital signs:  Temperature of 98, pulse 68, respiratory rate 14, blood pressure 145/50, weight is 126 pounds. Head and Neck:  Normocephalic, atraumatic skull.  There are no ocular or oral lesions.  There are no palpable cervical or supraclavicular lymph nodes.  Lungs:  Clear bilaterally.  Cardiac:  Regular rate and rhythm with a normal S1 and S2.  There are no murmurs, rubs, or bruits. Abdomen:  Soft.  She has good bowel sounds.  There is no fluid wave. There is no palpable abdominal mass.  There is no palpable hepatosplenomegaly.  Extremities:  No clubbing, cyanosis, or edema. Neurological:  No focal neurological deficits.  Skin:  No rashes, ecchymoses or petechiae.  LABORATORY STUDIES:  White cell count is 10.3, hemoglobin 14, hematocrit 45.7, platelet count is 498.  IMPRESSION:  [name] is a very charming 79 year old white female with polycythemia vera.  She is JAK2 positive.  We will go ahead and get her back on Hydrea.  We will try 500 mg p.o. once a week.  We will see how this works for her.  I want see her back right after Thanksgiving.  I think we can probably hold off on phlebotomizing her for right  now.    ______________________________ Josph Macho, M.D. PRE/MEDQ  D:  04/14/2013  T:  04/15/2013  Job:  [id number]

## 2013-05-02 DIAGNOSIS — H04229 Epiphora due to insufficient drainage, unspecified lacrimal gland: Secondary | ICD-10-CM | POA: Diagnosis not present

## 2013-05-17 ENCOUNTER — Telehealth: Payer: Self-pay | Admitting: Hematology & Oncology

## 2013-05-17 NOTE — Telephone Encounter (Signed)
LEFT MESSAGE MOVED 12-5 TO 12-4

## 2013-05-18 ENCOUNTER — Telehealth: Payer: Self-pay | Admitting: Hematology & Oncology

## 2013-05-18 NOTE — Telephone Encounter (Signed)
Left pt message moved appointment back to 12-5, per MD

## 2013-05-19 ENCOUNTER — Ambulatory Visit: Payer: Medicare Other | Admitting: Hematology & Oncology

## 2013-05-19 ENCOUNTER — Other Ambulatory Visit: Payer: Medicare Other | Admitting: Lab

## 2013-05-20 ENCOUNTER — Ambulatory Visit: Payer: Medicare Other | Admitting: Hematology & Oncology

## 2013-05-20 ENCOUNTER — Other Ambulatory Visit (HOSPITAL_BASED_OUTPATIENT_CLINIC_OR_DEPARTMENT_OTHER): Payer: Medicare Other | Admitting: Lab

## 2013-05-20 ENCOUNTER — Other Ambulatory Visit: Payer: Medicare Other | Admitting: Lab

## 2013-05-20 ENCOUNTER — Ambulatory Visit (HOSPITAL_BASED_OUTPATIENT_CLINIC_OR_DEPARTMENT_OTHER): Payer: Medicare Other

## 2013-05-20 ENCOUNTER — Ambulatory Visit (HOSPITAL_BASED_OUTPATIENT_CLINIC_OR_DEPARTMENT_OTHER): Payer: Medicare Other | Admitting: Hematology & Oncology

## 2013-05-20 VITALS — BP 131/50 | HR 70 | Temp 97.8°F | Resp 14 | Ht 62.0 in | Wt 122.0 lb

## 2013-05-20 VITALS — BP 154/82 | HR 78 | Resp 16

## 2013-05-20 DIAGNOSIS — D45 Polycythemia vera: Secondary | ICD-10-CM | POA: Diagnosis not present

## 2013-05-20 DIAGNOSIS — D751 Secondary polycythemia: Secondary | ICD-10-CM

## 2013-05-20 LAB — CHCC SATELLITE - SMEAR

## 2013-05-20 LAB — RETICULOCYTES (CHCC)
ABS Retic: 87.4 10*3/uL (ref 19.0–186.0)
Retic Ct Pct: 1.3 % (ref 0.4–2.3)

## 2013-05-20 LAB — CBC WITH DIFFERENTIAL (CANCER CENTER ONLY)
BASO%: 1.2 % (ref 0.0–2.0)
Eosinophils Absolute: 0.4 10*3/uL (ref 0.0–0.5)
HGB: 14.7 g/dL (ref 11.6–15.9)
MONO#: 1.6 10*3/uL — ABNORMAL HIGH (ref 0.1–0.9)
MONO%: 12.5 % (ref 0.0–13.0)
NEUT#: 8.9 10*3/uL — ABNORMAL HIGH (ref 1.5–6.5)
NEUT%: 71.1 % (ref 39.6–80.0)
Platelets: 462 10*3/uL — ABNORMAL HIGH (ref 145–400)
RBC: 6.74 10*6/uL — ABNORMAL HIGH (ref 3.70–5.32)
WBC: 12.5 10*3/uL — ABNORMAL HIGH (ref 3.9–10.0)

## 2013-05-20 NOTE — Patient Instructions (Signed)
Therapeutic Phlebotomy Therapeutic phlebotomy is the controlled removal of blood from your body for the purpose of treating a medical condition. It is similar to donating blood. Usually, about a pint (470 mL) of blood is removed. The average adult has 9 to 12 pints (4.3 to 5.7 L) of blood. Therapeutic phlebotomy may be used to treat the following medical conditions:  Hemochromatosis. This is a condition in which there is too much iron in the blood.  Polycythemia vera. This is a condition in which there are too many red cells in the blood.  Porphyria cutanea tarda. This is a disease usually passed from one generation to the next (inherited). It is a condition in which an important part of hemoglobin is not made properly. This results in the build up of abnormal amounts of porphyrins in the body.  Sickle cell disease. This is an inherited disease. It is a condition in which the red blood cells form an abnormal crescent shape rather than a round shape. LET YOUR CAREGIVER KNOW ABOUT:  Allergies.  Medicines taken including herbs, eyedrops, over-the-counter medicines, and creams.  Use of steroids (by mouth or creams).  Previous problems with anesthetics or numbing medicine.  History of blood clots.  History of bleeding or blood problems.  Previous surgery.  Possibility of pregnancy, if this applies. RISKS AND COMPLICATIONS This is a simple and safe procedure. Problems are unlikely. However, problems can occur and may include:  Nausea or lightheadedness.  Low blood pressure.  Soreness, bleeding, swelling, or bruising at the needle insertion site.  Infection. BEFORE THE PROCEDURE  This is a procedure that can be done as an outpatient. Confirm the time that you need to arrive for your procedure. Confirm whether there is a need to fast or withhold any medications. It is helpful to wear clothing with sleeves that can be raised above the elbow. A blood sample may be done to determine the  amount of red blood cells or iron in your blood. Plan ahead of time to have someone drive you home after the procedure. PROCEDURE The entire procedure from preparation through recovery takes about 1 hour. The actual collection takes about 10 to 15 minutes.  A needle will be inserted into your vein.  Tubing and a collection bag will be attached to that needle.  Blood will flow through the needle and tubing into the collection bag.  You may be asked to open and close your hand slowly and continuously during the entire collection.  Once the specified amount of blood has been removed from your body, the collection bag and tubing will be clamped.  The needle will be removed.  Pressure will be held on the site of the needle insertion to stop the bleeding. Then a bandage will be placed over the needle insertion site. AFTER THE PROCEDURE  Your recovery will be assessed and monitored. If there are no problems, as an outpatient, you should be able to go home shortly after the procedure.  Document Released: 11/04/2010 Document Revised: 08/25/2011 Document Reviewed: 11/04/2010 [employer] Patient Information 2014 [employer].  

## 2013-05-20 NOTE — Progress Notes (Signed)
[name] presents today for phlebotomy per MD orders. Phlebotomy procedure started at 1455 and ended at 1550. 400 ml removed. Patient observed for 30 minutes after procedure without any incident. Patient tolerated procedure well. IV needle removed intact.  Blood was very thick

## 2013-05-20 NOTE — Progress Notes (Signed)
This office note has been dictated.

## 2013-05-23 LAB — FERRITIN CHCC: Ferritin: 11 ng/ml (ref 9–269)

## 2013-05-23 NOTE — Progress Notes (Signed)
CC:   Stacie [place]. Cliffton Asters, M.D.  DIAGNOSIS:  Polycythemia vera -- JAK2-positive.  CURRENT THERAPY: 1. Hydrea 500 mg p.o. every week. 2. Aspirin 325 mg p.o. daily. 3. Phlebotomy to maintain hematocrit below 45%.  INTERIM HISTORY:  Ms. Fort comes in for followup.  We see her every couple of months.  Unfortunately, she fell I think a couple of weeks ago.  Thankfully, she did not break anything.  I was certainly quite concerned over this.  Otherwise, she is doing okay.  She is still volunteering over at [facility].  She has had no problem with headaches.  There have been no CVA or TIA type symptoms.  She has had no problems with bowels or bladder.  There is no constipation or diarrhea.  There has been no leg swelling.  She has had no rashes.  PHYSICAL EXAMINATION:  General:  This is a well-developed, well- nourished elderly, petite white female in no obvious distress.  Vital Signs:  Temperature of 97.8, pulse 70, respiratory rate 20, blood pressure 131/50.  Weight is 122 pounds.  HEENT:  Normocephalic, atraumatic skull.  There are no ocular or oral lesions.  There are no palpable cervical or supraclavicular lymph nodes.  Lungs:  Clear bilaterally.  Cardiac:  Regular rate and rhythm with a normal S1 and S2. There are no murmurs, rubs, or bruits.  Abdomen:  Soft.  She has good bowel sounds.  There is no fluid wave.  There is no palpable hepatosplenomegaly.  Back:  No tenderness over the spine, ribs, or hips. She has a slight kyphosis.  Extremities:  Some osteoarthritic changes in her joints.  She has good muscle strength in her upper and lower extremities.  Neurological:  No focal neurological deficits.  LABORATORY STUDIES:  White cell count 12.5, hemoglobin 14.7, hematocrit 47.9, platelet count 462.  MCV is 71.  IMPRESSION:  Ms. Odeh is a very charming 79 year old white female with polycythemia vera.  Again, she is JAK2-positive.  We have her on Hydrea weekly.  We will  go ahead and phlebotomize her.  I really think that this is something that would help her out, and I want to make sure we get her through the holidays with her not having complications.  I will go ahead and get her back to see me in another 6 weeks or so.  We will certainly continue to watch her platelet count and white cell count to see if we need to adjust the Hydrea dose.  Of note, the FDA just approved Jakafi for polycythemia.    ______________________________ Josph Macho, M.D. PRE/MEDQ  D:  05/20/2013  T:  05/23/2013  Job:  [id number]

## 2013-06-13 ENCOUNTER — Ambulatory Visit (INDEPENDENT_AMBULATORY_CARE_PROVIDER_SITE_OTHER): Payer: Medicare Other | Admitting: *Deleted

## 2013-06-13 DIAGNOSIS — I441 Atrioventricular block, second degree: Secondary | ICD-10-CM | POA: Diagnosis not present

## 2013-06-13 LAB — MDC_IDC_ENUM_SESS_TYPE_REMOTE
Battery Remaining Longevity: 70 mo
Brady Statistic AP VP Percent: 49 %
Brady Statistic AS VP Percent: 51 %
Brady Statistic AS VS Percent: 0 %
Date Time Interrogation Session: 20141229193259
Lead Channel Impedance Value: 386 Ohm
Lead Channel Impedance Value: 533 Ohm
Lead Channel Setting Pacing Amplitude: 2 V
Lead Channel Setting Pacing Amplitude: 2.5 V
Lead Channel Setting Sensing Sensitivity: 5.6 mV

## 2013-06-23 ENCOUNTER — Encounter: Payer: Self-pay | Admitting: *Deleted

## 2013-07-01 ENCOUNTER — Other Ambulatory Visit (HOSPITAL_BASED_OUTPATIENT_CLINIC_OR_DEPARTMENT_OTHER): Payer: Medicare Other | Admitting: Lab

## 2013-07-01 ENCOUNTER — Encounter: Payer: Self-pay | Admitting: Internal Medicine

## 2013-07-01 ENCOUNTER — Encounter: Payer: Self-pay | Admitting: Hematology & Oncology

## 2013-07-01 ENCOUNTER — Ambulatory Visit (HOSPITAL_BASED_OUTPATIENT_CLINIC_OR_DEPARTMENT_OTHER): Payer: Medicare Other | Admitting: Hematology & Oncology

## 2013-07-01 ENCOUNTER — Ambulatory Visit (HOSPITAL_BASED_OUTPATIENT_CLINIC_OR_DEPARTMENT_OTHER): Payer: Medicare Other

## 2013-07-01 ENCOUNTER — Other Ambulatory Visit: Payer: Self-pay | Admitting: *Deleted

## 2013-07-01 VITALS — BP 158/46 | HR 61 | Temp 97.6°F | Resp 14 | Ht 61.0 in | Wt 123.0 lb

## 2013-07-01 VITALS — BP 170/73 | HR 59 | Resp 20

## 2013-07-01 DIAGNOSIS — D45 Polycythemia vera: Secondary | ICD-10-CM

## 2013-07-01 DIAGNOSIS — D751 Secondary polycythemia: Secondary | ICD-10-CM

## 2013-07-01 LAB — CBC WITH DIFFERENTIAL (CANCER CENTER ONLY)
BASO#: 0.3 10*3/uL — ABNORMAL HIGH (ref 0.0–0.2)
BASO%: 1.4 % (ref 0.0–2.0)
EOS ABS: 0.7 10*3/uL — AB (ref 0.0–0.5)
EOS%: 3.9 % (ref 0.0–7.0)
HEMATOCRIT: 46 % (ref 34.8–46.6)
HGB: 14 g/dL (ref 11.6–15.9)
LYMPH#: 2.3 10*3/uL (ref 0.9–3.3)
LYMPH%: 12.8 % — AB (ref 14.0–48.0)
MCH: 20.2 pg — AB (ref 26.0–34.0)
MCHC: 30.4 g/dL — ABNORMAL LOW (ref 32.0–36.0)
MCV: 66 fL — AB (ref 81–101)
MONO#: 2.3 10*3/uL — ABNORMAL HIGH (ref 0.1–0.9)
MONO%: 12.9 % (ref 0.0–13.0)
NEUT#: 12.5 10*3/uL — ABNORMAL HIGH (ref 1.5–6.5)
NEUT%: 69 % (ref 39.6–80.0)
PLATELETS: 521 10*3/uL — AB (ref 145–400)
RBC: 6.94 10*6/uL — AB (ref 3.70–5.32)
RDW: 21.4 % — ABNORMAL HIGH (ref 11.1–15.7)
WBC: 18.2 10*3/uL — AB (ref 3.9–10.0)

## 2013-07-01 LAB — CHCC SATELLITE - SMEAR

## 2013-07-01 LAB — TECHNOLOGIST REVIEW CHCC SATELLITE

## 2013-07-01 MED ORDER — HYDROXYUREA 500 MG PO CAPS: 500.0000 mg | ORAL_CAPSULE | ORAL | Status: DC

## 2013-07-01 NOTE — Patient Instructions (Signed)

## 2013-07-01 NOTE — Progress Notes (Signed)
This office note has been dictated.

## 2013-07-01 NOTE — Progress Notes (Signed)
[name] presents today for phlebotomy per MD orders. Phlebotomy procedure started at 1525 and ended at 69. Approximately 500 mls removed. Patient observed for 30 minutes after procedure without any incident. Patient tolerated procedure well. IV needle removed intact.

## 2013-07-02 NOTE — Progress Notes (Signed)
DIAGNOSIS:  Polycythemia vera-JAK2-positive.  CURRENT THERAPY: 1. Hydrea 500 mg p.o. week. 2. Aspirin 325 mg p.o. daily. 3. Phlebotomy to maintain hematocrit below 45%.  INTERIM HISTORY:  [name] comes in for a followup.  We last saw her back in early December.  She was phlebotomized at that point in time.  Unfortunately, the issues, with getting her Hydrea.  She has been off Hydrea now for a couple of weeks.  We certainly need to get her back on to [facility].  She has had no headache.  She had a nice Christmas.  She had no headache.  There is no weakness.  She has had no nausea, vomiting.  She has had no chest pain.  There has been no cough.  She has gotten away with not getting the flu.  PHYSICAL EXAMINATION:  General:  This is an elderly, petite white female in no obvious distress.  Vital Signs:  Temperature of 97.6, pulse 61, respiratory rate 14, blood pressure 154/45.  Weight is 123 pounds.  Head and Neck:  Normocephalic, atraumatic skull.  There are no ocular or oral lesions.  She has no palpable cervical or supraclavicular lymph nodes. Lungs:  Clear bilaterally.  Cardiac:  Regular rate and rhythm with a normal S1 and S2.  There are no murmurs, rubs, or bruits.  Abdomen: Soft.  She has good bowel sounds.  There is no fluid wave.  There is no palpable abdominal mass.  There is no palpable hepatosplenomegaly. Back:  Shows some slight kyphosis.  Extremities:  Some osteoarthritic changes in her joints.  Her muscle strength is 4/5 bilaterally.  Skin: No ecchymosis or petechia.  Neurological:  Shows no focal neurological deficits.  LABORATORY STUDIES:  White cell count is 18.2, hemoglobin 14, hematocrit 46, platelet count 521.  MCV is 66.  IMPRESSION:  [name] is a very charming 80 year old white female. She has JAK2-positive polycythemia.  She has been off Hydrea for 2 weeks.  We will get her back onto Hydrea 500 mg a week.  We will go ahead and phlebotomize her.   I really want to get her hematocrit below 45%.  I want to see her back in about a month or so.    ______________________________ Volanda Napoleon, M.D. PRE/MEDQ  D:  07/01/2013  T:  07/02/2013  Job:  [id number]

## 2013-07-26 DIAGNOSIS — W19XXXA Unspecified fall, initial encounter: Secondary | ICD-10-CM | POA: Diagnosis not present

## 2013-07-26 DIAGNOSIS — E785 Hyperlipidemia, unspecified: Secondary | ICD-10-CM | POA: Diagnosis not present

## 2013-07-26 DIAGNOSIS — I10 Essential (primary) hypertension: Secondary | ICD-10-CM | POA: Diagnosis not present

## 2013-07-26 DIAGNOSIS — Z950 Presence of cardiac pacemaker: Secondary | ICD-10-CM | POA: Diagnosis not present

## 2013-07-26 DIAGNOSIS — R55 Syncope and collapse: Secondary | ICD-10-CM | POA: Diagnosis not present

## 2013-07-27 ENCOUNTER — Telehealth: Payer: Self-pay | Admitting: Hematology & Oncology

## 2013-07-27 ENCOUNTER — Other Ambulatory Visit: Payer: Self-pay | Admitting: Hematology & Oncology

## 2013-07-27 DIAGNOSIS — D751 Secondary polycythemia: Secondary | ICD-10-CM

## 2013-07-27 NOTE — Telephone Encounter (Signed)
Left message with 2-16 appointment

## 2013-07-27 NOTE — Telephone Encounter (Signed)
Cathy left message about a follow up appointment for pt. I left her message RN is checking with MD and I will call her back to schedule, our computers have been down till about 5 minutes ago

## 2013-08-01 ENCOUNTER — Other Ambulatory Visit (HOSPITAL_BASED_OUTPATIENT_CLINIC_OR_DEPARTMENT_OTHER): Payer: Medicare Other | Admitting: Lab

## 2013-08-01 ENCOUNTER — Ambulatory Visit (HOSPITAL_BASED_OUTPATIENT_CLINIC_OR_DEPARTMENT_OTHER): Payer: Medicare Other | Admitting: Hematology & Oncology

## 2013-08-01 ENCOUNTER — Encounter: Payer: Self-pay | Admitting: Hematology & Oncology

## 2013-08-01 ENCOUNTER — Telehealth: Payer: Self-pay | Admitting: Hematology & Oncology

## 2013-08-01 VITALS — BP 158/62 | HR 72 | Temp 97.6°F | Resp 14 | Ht 61.0 in | Wt 125.0 lb

## 2013-08-01 DIAGNOSIS — D45 Polycythemia vera: Secondary | ICD-10-CM

## 2013-08-01 DIAGNOSIS — D751 Secondary polycythemia: Secondary | ICD-10-CM

## 2013-08-01 LAB — CBC WITH DIFFERENTIAL (CANCER CENTER ONLY)
BASO#: 0.2 10*3/uL (ref 0.0–0.2)
BASO%: 1.5 % (ref 0.0–2.0)
EOS ABS: 0.8 10*3/uL — AB (ref 0.0–0.5)
EOS%: 4.7 % (ref 0.0–7.0)
HCT: 44.9 % (ref 34.8–46.6)
HGB: 13.6 g/dL (ref 11.6–15.9)
LYMPH#: 2.2 10*3/uL (ref 0.9–3.3)
LYMPH%: 13.4 % — ABNORMAL LOW (ref 14.0–48.0)
MCH: 19.3 pg — ABNORMAL LOW (ref 26.0–34.0)
MCHC: 30.3 g/dL — ABNORMAL LOW (ref 32.0–36.0)
MCV: 64 fL — ABNORMAL LOW (ref 81–101)
MONO#: 2.1 10*3/uL — ABNORMAL HIGH (ref 0.1–0.9)
MONO%: 12.5 % (ref 0.0–13.0)
NEUT%: 67.9 % (ref 39.6–80.0)
NEUTROS ABS: 11.2 10*3/uL — AB (ref 1.5–6.5)
PLATELETS: 147 10*3/uL (ref 145–400)
RBC: 7.03 10*6/uL — AB (ref 3.70–5.32)
RDW: 22.2 % — ABNORMAL HIGH (ref 11.1–15.7)
WBC: 16.5 10*3/uL — ABNORMAL HIGH (ref 3.9–10.0)

## 2013-08-01 LAB — COMPREHENSIVE METABOLIC PANEL
ALK PHOS: 73 U/L (ref 39–117)
ALT: 25 U/L (ref 0–35)
AST: 30 U/L (ref 0–37)
Albumin: 4.3 g/dL (ref 3.5–5.2)
BUN: 26 mg/dL — ABNORMAL HIGH (ref 6–23)
CO2: 29 mEq/L (ref 19–32)
CREATININE: 0.72 mg/dL (ref 0.50–1.10)
Calcium: 9.8 mg/dL (ref 8.4–10.5)
Chloride: 106 mEq/L (ref 96–112)
Glucose, Bld: 101 mg/dL — ABNORMAL HIGH (ref 70–99)
Potassium: 4.4 mEq/L (ref 3.5–5.3)
SODIUM: 142 meq/L (ref 135–145)
TOTAL PROTEIN: 6.6 g/dL (ref 6.0–8.3)
Total Bilirubin: 0.7 mg/dL (ref 0.2–1.2)

## 2013-08-01 LAB — IRON AND TIBC CHCC
%SAT: 6 % — ABNORMAL LOW (ref 21–57)
IRON: 27 ug/dL — AB (ref 41–142)
TIBC: 426 ug/dL (ref 236–444)
UIBC: 399 ug/dL — AB (ref 120–384)

## 2013-08-01 LAB — TECHNOLOGIST REVIEW CHCC SATELLITE

## 2013-08-01 LAB — FERRITIN CHCC: FERRITIN: 13 ng/mL (ref 9–269)

## 2013-08-01 NOTE — Telephone Encounter (Signed)
Tiffany aware pt needs to move to that location and see Dr. Benay Spice. I also sent her an e-mail.

## 2013-08-01 NOTE — Progress Notes (Signed)
Hematology and Oncology Follow Up Visit  [name] [id number] 1923/02/21 80 y.o. 08/01/2013   Principle Diagnosis:   Polycythemia vera-JAK2 positive    Current Therapy:    #1 Hydrea 500 mg by mouth every week.  #2 aspirin 325 mg by mouth daily.  #3 phlebotomy to keep hematocrit below 45%     Interim History:  [name] is in for followup. She's doing quite well out. When we last saw her hematocrit is 46% so we phlebotomized her.  She is iron deficient. Her last ferritin was 11.  She's had no headache. She's on full dose aspirin because of a past CVA. She has no symptoms from this.  She's had no fever. The been no cough. She's had no shortness of breath. There's been no change in bowel or bladder habits.  Medications: Current outpatient prescriptions:amLODipine (NORVASC) 5 MG tablet, Take 5 mg by mouth daily.  , Disp: , Rfl: ;  aspirin 325 MG tablet, Take 325 mg by mouth daily. , Disp: , Rfl: ;  beta carotene w/minerals (OCUVITE) tablet, Take 1 tablet by mouth daily.  , Disp: , Rfl: ;  calcium-vitamin D (OSCAL WITH D) 500-200 MG-UNIT per tablet, Take 2 tablets by mouth daily.  , Disp: , Rfl:  hydroxyurea (HYDREA) 500 MG capsule, Take 1 capsule (500 mg total) by mouth once a week. May take with food to minimize GI side effects., Disp: 10 capsule, Rfl: 3;  Naproxen Sodium (ALEVE) 220 MG CAPS, Take by mouth 2 (two) times daily., Disp: , Rfl: ;  Polyethyl Glycol-Propyl Glycol (SYSTANE ULTRA OP), Apply 1 drop to eye 4 (four) times daily. Takes 1 drop in each eye  BID., Disp: , Rfl:  pyridOXINE (VITAMIN B-6) 100 MG tablet, Take 100 mg by mouth daily., Disp: , Rfl: ;  simvastatin (ZOCOR) 10 MG tablet, Take 10 mg by mouth daily.  , Disp: , Rfl:   Allergies: No Known Allergies  Past Medical History, Surgical history, Social history, and Family History were reviewed and updated.  Review of Systems: As above  Physical Exam:  height is 5\' 1"  (1.549 m) and weight is 125 lb (56.7  kg). Her oral temperature is 97.6 F (36.4 C). Her blood pressure is 158/62 and her pulse is 72. Her respiration is 14.  she has no ocular or oral lesions. There are no palpable cervical supraclavicular lymph nodes. Liver is not. Spleen is not. Lungs are clear. Cardiac exam regular in rhythm. Extremities osteoarthritic changes. No neurological findings are noted. Skin no rashes.  Lab Results  Component Value Date   WBC 16.5* 08/01/2013   HGB 13.6 08/01/2013   HCT 44.9 08/01/2013   MCV 64* 08/01/2013   PLT 147 08/01/2013     Chemistry      Component Value Date/Time   NA 142 10/26/2012 0938   K 4.5 10/26/2012 0938   CL 106 10/26/2012 0938   CO2 26 10/26/2012 0938   BUN 15 10/26/2012 0938   CREATININE 0.76 10/26/2012 0938      Component Value Date/Time   CALCIUM 9.8 10/26/2012 0938   ALKPHOS 66 11/19/2011 0848   AST 24 11/19/2011 0848   ALT 21 11/19/2011 0848   BILITOT 0.7 11/19/2011 0848         Impression and Plan: [name] is  may 80 year old white female with polycythemia. She's on Hydrea only weakly. She's done very well with this. We do watch her platelet counts.  We will not phlebotomize her right  now.  Unfortunately, she will not go to come back out to see [place]. 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 As such we will refer her back to the main cancer [facility] and will see if Dr. Benay Spice can see her. She would like if she could see her.  Am sure when she does go back, she will probably need to be phlebotomized.  I told her that we always would be here for her if she needs to have anything done or for any other issue.  Volanda Napoleon, MD 2/16/201511:20 AM

## 2013-08-02 ENCOUNTER — Other Ambulatory Visit: Payer: Self-pay | Admitting: *Deleted

## 2013-08-02 DIAGNOSIS — D751 Secondary polycythemia: Secondary | ICD-10-CM

## 2013-08-04 ENCOUNTER — Telehealth: Payer: Self-pay | Admitting: Oncology

## 2013-08-04 NOTE — Telephone Encounter (Signed)
, °

## 2013-08-15 ENCOUNTER — Encounter: Payer: Self-pay | Admitting: Cardiology

## 2013-08-30 ENCOUNTER — Encounter: Payer: Medicare Other | Admitting: Cardiology

## 2013-09-05 ENCOUNTER — Other Ambulatory Visit (HOSPITAL_BASED_OUTPATIENT_CLINIC_OR_DEPARTMENT_OTHER): Payer: Medicare Other

## 2013-09-05 ENCOUNTER — Ambulatory Visit (HOSPITAL_BASED_OUTPATIENT_CLINIC_OR_DEPARTMENT_OTHER): Payer: Medicare Other | Admitting: Oncology

## 2013-09-05 VITALS — BP 181/59 | HR 76 | Temp 97.3°F | Resp 17 | Ht 61.0 in | Wt 124.3 lb

## 2013-09-05 DIAGNOSIS — D508 Other iron deficiency anemias: Secondary | ICD-10-CM | POA: Diagnosis not present

## 2013-09-05 DIAGNOSIS — D45 Polycythemia vera: Secondary | ICD-10-CM

## 2013-09-05 DIAGNOSIS — D751 Secondary polycythemia: Secondary | ICD-10-CM

## 2013-09-05 LAB — CBC WITH DIFFERENTIAL/PLATELET
BASO%: 0.7 % (ref 0.0–2.0)
Basophils Absolute: 0.1 10*3/uL (ref 0.0–0.1)
EOS ABS: 0.8 10*3/uL — AB (ref 0.0–0.5)
EOS%: 4 % (ref 0.0–7.0)
HCT: 44.8 % (ref 34.8–46.6)
HGB: 13.3 g/dL (ref 11.6–15.9)
LYMPH%: 10.7 % — AB (ref 14.0–49.7)
MCH: 18.7 pg — AB (ref 25.1–34.0)
MCHC: 29.7 g/dL — AB (ref 31.5–36.0)
MCV: 63 fL — ABNORMAL LOW (ref 79.5–101.0)
MONO#: 2.1 10*3/uL — ABNORMAL HIGH (ref 0.1–0.9)
MONO%: 11 % (ref 0.0–14.0)
NEUT#: 14.3 10*3/uL — ABNORMAL HIGH (ref 1.5–6.5)
NEUT%: 73.6 % (ref 38.4–76.8)
PLATELETS: 309 10*3/uL (ref 145–400)
RBC: 7.12 10*6/uL — AB (ref 3.70–5.45)
RDW: 20.7 % — AB (ref 11.2–14.5)
WBC: 19.5 10*3/uL — ABNORMAL HIGH (ref 3.9–10.3)
lymph#: 2.1 10*3/uL (ref 0.9–3.3)

## 2013-09-05 NOTE — Patient Instructions (Signed)
Fall Prevention and Home Safety Falls cause injuries and can affect all age groups. It is possible to use preventive measures to significantly decrease the likelihood of falls. There are many simple measures which can make your home safer and prevent falls. OUTDOORS  Repair cracks and edges of walkways and driveways.  Remove high doorway thresholds.  Trim shrubbery on the main path into your home.  Have good outside lighting.  Clear walkways of tools, rocks, debris, and clutter.  Check that handrails are not broken and are securely fastened. Both sides of steps should have handrails.  Have leaves, snow, and ice cleared regularly.  Use sand or salt on walkways during winter months.  In the garage, clean up grease or oil spills. BATHROOM  Install night lights.  Install grab bars by the toilet and in the tub and shower.  Use non-skid mats or decals in the tub or shower.  Place a plastic non-slip stool in the shower to sit on, if needed.  Keep floors dry and clean up all water on the floor immediately.  Remove soap buildup in the tub or shower on a regular basis.  Secure bath mats with non-slip, double-sided rug tape.  Remove throw rugs and tripping hazards from the floors. BEDROOMS  Install night lights.  Make sure a bedside light is easy to reach.  Do not use oversized bedding.  Keep a telephone by your bedside.  Have a firm chair with side arms to use for getting dressed.  Remove throw rugs and tripping hazards from the floor. KITCHEN  Keep handles on pots and pans turned toward the center of the stove. Use back burners when possible.  Clean up spills quickly and allow time for drying.  Avoid walking on wet floors.  Avoid hot utensils and knives.  Position shelves so they are not too high or low.  Place commonly used objects within easy reach.  If necessary, use a sturdy step stool with a grab bar when reaching.  Keep electrical cables out of the  way.  Do not use floor polish or wax that makes floors slippery. If you must use wax, use non-skid floor wax.  Remove throw rugs and tripping hazards from the floor. STAIRWAYS  Never leave objects on stairs.  Place handrails on both sides of stairways and use them. Fix any loose handrails. Make sure handrails on both sides of the stairways are as long as the stairs.  Check carpeting to make sure it is firmly attached along stairs. Make repairs to worn or loose carpet promptly.  Avoid placing throw rugs at the top or bottom of stairways, or properly secure the rug with carpet tape to prevent slippage. Get rid of throw rugs, if possible.  Have an electrician put in a light switch at the top and bottom of the stairs. OTHER FALL PREVENTION TIPS  Wear low-heel or rubber-soled shoes that are supportive and fit well. Wear closed toe shoes.  DO NOT USE ANY TYPE OF LADDER  Add color or contrast paint or tape to grab bars and handrails in your home. Place contrasting color strips on first and last steps.  Learn and use mobility aids as needed. Install an electrical emergency response system.  Turn on lights to avoid dark areas. Replace light bulbs that burn out immediately. Get light switches that glow.  Arrange furniture to create clear pathways. Keep furniture in the same place.  Firmly attach carpet with non-skid or double-sided tape.  Eliminate uneven floor surfaces.  Select a carpet pattern that does not visually hide the edge of steps.  CONSIDER GETTING A CANE OTHER HOME SAFETY TIPS  Set the water temperature for 120 F (48.8 C).  Keep emergency numbers on or near the telephone.  Keep smoke detectors on every level of the home and near sleeping areas. Document Released: 05/23/2002 Document Revised: 12/02/2011 Document Reviewed: 08/22/2011 [facility] Patient Information 2014 [employer].

## 2013-09-05 NOTE — Progress Notes (Signed)
   [facility]    OFFICE PROGRESS NOTE   INTERVAL HISTORY:   She has been followed by Dr. Marin Olp for management of polycythemia vera. She is currently maintained on once weekly hydroxyurea. She last underwent phlebotomy therapy in January of this year.  She feels well. No specific complaint. The goal has been to keep the hematocrit below 45%.  Objective:  Vital signs in last 24 hours:  Blood pressure 181/59, pulse 76, temperature 97.3 F (36.3 C), temperature source Oral, resp. rate 17, height 5\' 1"  (1.549 m), weight 124 lb 4.8 oz (56.382 kg), SpO2 99.00%.    HEENT: No thrush or ulcers Resp: Lungs clear bilaterally Cardio: Regular rate and rhythm GI: No hepatosplenomegaly Vascular: No leg edema   Lab Results:  Lab Results  Component Value Date   WBC 19.5* 09/05/2013   HGB 13.3 09/05/2013   HCT 44.8 09/05/2013   MCV 63.0* 09/05/2013   PLT 309 09/05/2013   NEUTROABS 14.3* 09/05/2013      Medications: I have reviewed the patient's current medications.  Assessment/Plan:  1. Polycythemia vera-JAK-2 positive 2. Iron deficiency secondary to phlebotomy  Disposition:  She will continue the current hydroxyurea therapy. The hemoglobin/hematocrit are in the goal range today. She is maintained on aspirin. Ms. Molock will return for a lab visit in 6 weeks. She is scheduled for a three-month office visit. We will arrange for phlebotomy therapy as indicated.   Betsy Coder, MD  09/05/2013  6:37 PM

## 2013-09-06 ENCOUNTER — Telehealth: Payer: Self-pay | Admitting: Oncology

## 2013-09-06 NOTE — Telephone Encounter (Signed)
s.w. pt and advised on June appt....pt ok and aware pt needed afternoon appts

## 2013-09-13 ENCOUNTER — Encounter: Payer: Self-pay | Admitting: Internal Medicine

## 2013-09-13 ENCOUNTER — Encounter: Payer: Self-pay | Admitting: Cardiology

## 2013-09-13 ENCOUNTER — Ambulatory Visit (INDEPENDENT_AMBULATORY_CARE_PROVIDER_SITE_OTHER): Payer: Medicare Other | Admitting: Cardiology

## 2013-09-13 VITALS — BP 150/70 | HR 74 | Ht 62.0 in | Wt 125.0 lb

## 2013-09-13 DIAGNOSIS — I10 Essential (primary) hypertension: Secondary | ICD-10-CM | POA: Diagnosis not present

## 2013-09-13 DIAGNOSIS — Z950 Presence of cardiac pacemaker: Secondary | ICD-10-CM | POA: Diagnosis not present

## 2013-09-13 DIAGNOSIS — I442 Atrioventricular block, complete: Secondary | ICD-10-CM

## 2013-09-13 LAB — MDC_IDC_ENUM_SESS_TYPE_INCLINIC
Battery Remaining Longevity: 66 mo
Battery Voltage: 2.78 V
Brady Statistic AS VP Percent: 52 %
Brady Statistic AS VS Percent: 0 %
Lead Channel Impedance Value: 523 Ohm
Lead Channel Pacing Threshold Amplitude: 0.75 V
Lead Channel Pacing Threshold Pulse Width: 0.4 ms
Lead Channel Pacing Threshold Pulse Width: 0.4 ms
Lead Channel Sensing Intrinsic Amplitude: 5.6 mV
Lead Channel Setting Pacing Amplitude: 2 V
Lead Channel Setting Sensing Sensitivity: 5.6 mV
MDC IDC MSMT BATTERY IMPEDANCE: 604 Ohm
MDC IDC MSMT LEADCHNL RA IMPEDANCE VALUE: 407 Ohm
MDC IDC MSMT LEADCHNL RA PACING THRESHOLD AMPLITUDE: 0.75 V
MDC IDC SESS DTM: 20150331124904
MDC IDC SET LEADCHNL RV PACING AMPLITUDE: 2.5 V
MDC IDC SET LEADCHNL RV PACING PULSEWIDTH: 0.4 ms
MDC IDC STAT BRADY AP VP PERCENT: 48 %
MDC IDC STAT BRADY AP VS PERCENT: 0 %

## 2013-09-13 NOTE — Patient Instructions (Addendum)
Your physician wants you to follow-up in: 1 year with Dr. Vallery Ridge will receive a reminder letter in the mail two months in advance. If you don't receive a letter, please call our office to schedule the follow-up appointment.  Remote monitoring is used to monitor your Pacemaker of ICD from home. This monitoring reduces the number of office visits required to check your device to one time per year. It allows [place] to keep an eye on the functioning of your device to ensure it is working properly. You are scheduled for a device check from home on 12/13/13  You may send your transmission at any time that day. If you have a wireless device, the transmission will be sent automatically. After your physician reviews your transmission, you will receive a postcard with your next transmission date.  Your physician recommends that you continue on your current medications as directed. Please refer to the Current Medication list given to you today.

## 2013-09-13 NOTE — Progress Notes (Signed)
ELECTROPHYSIOLOGY OFFICE NOTE   Patient ID: [name] MRN: [id number], DOB/AGE: [birth year]   Date of Visit: 09/13/2013  Primary Physician: Harlan Stains, MD Primary EP: Thompson Grayer, MD Reason for Visit: EP/device follow-up  History of Present Illness  [name] is a 80 y.o. female with CHB s/p PPM implant who presents today for routine electrophysiology followup. Since last being seen in our clinic, she reports she is doing well. She occasionally hears her heart beating when lying down but denies racing palpitations. She states it is regular "just beating normally, I can just count it." She denies chest pain or shortness of breath. She denies palpitations, dizziness, near syncope or syncope. She denies LE swelling, orthopnea or PND. She is compliant with medications.  Past Medical History Past Medical History  Diagnosis Date  . Complete heart block   . Hypercholesterolemia   . Polycythemia vera(238.4)   . Syncope   . Hypertension   . Stroke     Past Surgical History Past Surgical History  Procedure Laterality Date  . Pacemaker insertion  03/26/09    MDT Adapta DR, revised for RV lead fracture by Dr Doreatha Lew  . Hand surgery      S/P Carpal tunnel repair  . Appendectomy    . Transthoracic echocardiogram  03/26/2009  . US echocardiography  03/01/2009    EF 55-60%    Allergies/Intolerances No Known Allergies  Current Home Medications Current Outpatient Prescriptions  Medication Sig Dispense Refill  . amLODipine (NORVASC) 5 MG tablet Take 5 mg by mouth daily.        Marland Kitchen aspirin 325 MG tablet Take 325 mg by mouth daily.       . beta carotene w/minerals (OCUVITE) tablet Take 1 tablet by mouth daily.        . calcium-vitamin D (OSCAL WITH D) 500-200 MG-UNIT per tablet Take 2 tablets by mouth daily.        . hydroxyurea (HYDREA) 500 MG capsule Take 1 capsule (500 mg total) by mouth once a week. May take with food to minimize GI side effects.  10 capsule  3  .  Naproxen Sodium (ALEVE) 220 MG CAPS Take by mouth 2 (two) times daily.      Vladimir Faster Glycol-Propyl Glycol (SYSTANE ULTRA OP) Apply 1 drop to eye 4 (four) times daily. Takes 1 drop in each eye  BID.      Marland Kitchen pyridOXINE (VITAMIN B-6) 100 MG tablet Take 100 mg by mouth daily.      . simvastatin (ZOCOR) 10 MG tablet Take 10 mg by mouth daily.         No current facility-administered medications for this visit.    Social History History   Social History  . Marital Status: Widowed    Spouse Name: N/A    Number of Children: N/A  . Years of Education: N/A   Occupational History  . Not on file.   Social History Main Topics  . Smoking status: Never Smoker   . Smokeless tobacco: Never Used     Comment: never smoked  . Alcohol Use: No  . Drug Use: No  . Sexual Activity: Not on file   Other Topics Concern  . Not on file   Social History Narrative  . No narrative on file     Review of Systems General: No chills, fever, night sweats or weight changes Cardiovascular: No chest pain, dyspnea on exertion, edema, orthopnea, palpitations, paroxysmal nocturnal dyspnea Dermatological: No rash, lesions or  masses Respiratory: No cough, dyspnea Urologic: No hematuria, dysuria Abdominal: No nausea, vomiting, diarrhea, bright red blood per rectum, melena, or hematemesis Neurologic: No visual changes, weakness, changes in mental status All other systems reviewed and are otherwise negative except as noted above.  Physical Exam Vitals: Blood pressure 150/70, pulse 74, height 5\' 2"  (1.575 m), weight 125 lb (56.7 kg).  General: Well developed, well appearing 80 y.o. female in no acute distress. HEENT: Normocephalic, atraumatic. EOMs intact. Sclera nonicteric. Oropharynx clear.  Neck: Supple. No JVD. Lungs: Respirations regular and unlabored, CTA bilaterally. No wheezes, rales or rhonchi. Heart: RRR. S1, S2 present. No murmurs, rub, S3 or S4. Abdomen: Soft, non-distended.  Extremities: No  clubbing, cyanosis or edema. PT/Radials 2+ and equal bilaterally. Psych: Normal affect. Neuro: Alert and oriented X 3. Moves all extremities spontaneously.   Diagnostics 12-lead ECG today - AV paced at 74 bpm Device interrogation today - Normal device function. Thresholds, sensing, impedances consistent with previous measurements. Device programmed to maximize longevity. 2 mode switch episodes, <1 minute in duration, 0 AHR, no EGM. 2 high ventricular rates noted, longest 5 seconds, EGM reviewed NSVT. Device programmed at appropriate safety margins. Histogram distribution appropriate for patient activity level. Device programmed to optimize intrinsic conduction. Estimated longevity 5.5 years.   Assessment and Plan 1. Complete heart block s/p PPM implant Normal device function Pacemaker dependent No programming changes made Continue routine remote PPM follow-up every 3 months Return for follow-up with Dr. Rayann Heman in one year  Signed, Ileene Hutchinson, PA-C 09/13/2013, 9:08 AM

## 2013-11-22 DIAGNOSIS — Z23 Encounter for immunization: Secondary | ICD-10-CM | POA: Diagnosis not present

## 2013-11-22 DIAGNOSIS — R259 Unspecified abnormal involuntary movements: Secondary | ICD-10-CM | POA: Diagnosis not present

## 2013-11-22 DIAGNOSIS — G2581 Restless legs syndrome: Secondary | ICD-10-CM | POA: Diagnosis not present

## 2013-11-22 DIAGNOSIS — R0602 Shortness of breath: Secondary | ICD-10-CM | POA: Diagnosis not present

## 2013-11-22 DIAGNOSIS — R413 Other amnesia: Secondary | ICD-10-CM | POA: Diagnosis not present

## 2013-11-28 ENCOUNTER — Ambulatory Visit: Payer: Medicare Other | Admitting: Nurse Practitioner

## 2013-11-28 ENCOUNTER — Ambulatory Visit (HOSPITAL_BASED_OUTPATIENT_CLINIC_OR_DEPARTMENT_OTHER): Payer: Medicare Other | Admitting: Nurse Practitioner

## 2013-11-28 ENCOUNTER — Other Ambulatory Visit (HOSPITAL_BASED_OUTPATIENT_CLINIC_OR_DEPARTMENT_OTHER): Payer: Medicare Other

## 2013-11-28 ENCOUNTER — Ambulatory Visit (HOSPITAL_BASED_OUTPATIENT_CLINIC_OR_DEPARTMENT_OTHER): Payer: Medicare Other

## 2013-11-28 ENCOUNTER — Other Ambulatory Visit: Payer: Medicare Other

## 2013-11-28 VITALS — BP 143/58 | HR 86 | Temp 97.8°F | Resp 18 | Ht 62.0 in | Wt 123.6 lb

## 2013-11-28 VITALS — BP 124/51 | HR 64

## 2013-11-28 DIAGNOSIS — D45 Polycythemia vera: Secondary | ICD-10-CM | POA: Diagnosis not present

## 2013-11-28 DIAGNOSIS — D500 Iron deficiency anemia secondary to blood loss (chronic): Secondary | ICD-10-CM

## 2013-11-28 DIAGNOSIS — D751 Secondary polycythemia: Secondary | ICD-10-CM

## 2013-11-28 LAB — CBC WITH DIFFERENTIAL/PLATELET
BASO%: 0.8 % (ref 0.0–2.0)
Basophils Absolute: 0.1 10*3/uL (ref 0.0–0.1)
EOS ABS: 0.7 10*3/uL — AB (ref 0.0–0.5)
EOS%: 4.6 % (ref 0.0–7.0)
HCT: 47.9 % — ABNORMAL HIGH (ref 34.8–46.6)
HEMOGLOBIN: 14.3 g/dL (ref 11.6–15.9)
LYMPH%: 11 % — AB (ref 14.0–49.7)
MCH: 18.6 pg — AB (ref 25.1–34.0)
MCHC: 29.9 g/dL — ABNORMAL LOW (ref 31.5–36.0)
MCV: 62.5 fL — ABNORMAL LOW (ref 79.5–101.0)
MONO#: 1.3 10*3/uL — ABNORMAL HIGH (ref 0.1–0.9)
MONO%: 7.9 % (ref 0.0–14.0)
NEUT%: 75.7 % (ref 38.4–76.8)
NEUTROS ABS: 12.1 10*3/uL — AB (ref 1.5–6.5)
PLATELETS: 280 10*3/uL (ref 145–400)
RBC: 7.67 10*6/uL — ABNORMAL HIGH (ref 3.70–5.45)
RDW: 22.5 % — ABNORMAL HIGH (ref 11.2–14.5)
WBC: 16 10*3/uL — ABNORMAL HIGH (ref 3.9–10.3)
lymph#: 1.8 10*3/uL (ref 0.9–3.3)
nRBC: 0 % (ref 0–0)

## 2013-11-28 NOTE — Patient Instructions (Signed)

## 2013-11-28 NOTE — Progress Notes (Signed)
  [facility] OFFICE PROGRESS NOTE   Diagnosis:  Polycythemia vera.  INTERVAL HISTORY:   [name] returns as scheduled. She continues Hydrea 500 mg weekly. She was last phlebotomized in January of this year.  No complaints. She feels well. She remains very active. No nausea or vomiting. No skin rash. No diarrhea. She denies leg swelling. No shortness of breath. No further falls.  Objective:  Vital signs in last 24 hours:  Blood pressure 143/58, pulse 86, temperature 97.8 F (36.6 C), temperature source Oral, resp. rate 18, height 5\' 2"  (1.575 m), weight 123 lb 9.6 oz (56.065 kg), SpO2 97.00%.    HEENT: No thrush or ulcerations. Resp: Lungs clear. Cardio: Regular cardiac rhythm. GI: Abdomen soft and nontender. No organomegaly. Vascular: Trace lower leg edema bilaterally.   Lab Results:  Lab Results  Component Value Date   WBC 16.0* 11/28/2013   HGB 14.3 11/28/2013   HCT 47.9* 11/28/2013   MCV 62.5* 11/28/2013   PLT 280 11/28/2013   NEUTROABS 12.1* 11/28/2013    Imaging:  No results found.  Medications: I have reviewed the patient's current medications.  Assessment/Plan: 1. Polycythemia vera-JAK-2 positive. 2. Iron deficiency secondary to phlebotomy.   Disposition: She will continue Hydrea at the current dose. She will continue aspirin. The hematocrit is slightly above goal range. She will undergo a phlebotomy today. She will return for repeat labs in 8 weeks and a followup visit in 4 months. She will contact the office in the interim with any problems.  Plan reviewed with Dr. Benay Spice.    Ned Card ANP/GNP-BC   11/28/2013  2:44 PM

## 2013-11-28 NOTE — Progress Notes (Signed)
1455-500 grams removed from right ac phlebotomy.  Pt tolerated well with no complaints.  Pressure dressing applied.  Snack and drink taken after phlebotomy.  1530-Pt discharged to home.  VS stable.  Pressure dressing clean, dry and intact to right ac at time of discharge.  Discharge instructions reviewed with patient and she has no questions at this time.

## 2013-11-30 ENCOUNTER — Telehealth: Payer: Self-pay | Admitting: Oncology

## 2013-11-30 NOTE — Telephone Encounter (Signed)
s.lw. pt and advised on Aug and OCT....pt ok and aware

## 2013-12-05 DIAGNOSIS — G2581 Restless legs syndrome: Secondary | ICD-10-CM | POA: Diagnosis not present

## 2013-12-08 ENCOUNTER — Ambulatory Visit (HOSPITAL_COMMUNITY)
Admission: RE | Admit: 2013-12-08 | Discharge: 2013-12-08 | Disposition: A | Discharge status: Home / Self Care | Payer: Medicare Other | Source: Ambulatory Visit | Attending: Family Medicine | Admitting: Family Medicine

## 2013-12-08 ENCOUNTER — Other Ambulatory Visit (HOSPITAL_COMMUNITY): Payer: Self-pay | Admitting: Family Medicine

## 2013-12-08 DIAGNOSIS — R0602 Shortness of breath: Secondary | ICD-10-CM

## 2013-12-13 ENCOUNTER — Ambulatory Visit (INDEPENDENT_AMBULATORY_CARE_PROVIDER_SITE_OTHER): Payer: Medicare Other | Admitting: *Deleted

## 2013-12-13 ENCOUNTER — Telehealth: Payer: Self-pay | Admitting: Cardiology

## 2013-12-13 DIAGNOSIS — I441 Atrioventricular block, second degree: Secondary | ICD-10-CM | POA: Diagnosis not present

## 2013-12-13 NOTE — Telephone Encounter (Signed)
LMOVM reminding pt to send remote transmission.   

## 2013-12-14 NOTE — Progress Notes (Signed)
Remote pacemaker transmission.   

## 2013-12-23 LAB — MDC_IDC_ENUM_SESS_TYPE_REMOTE
Battery Impedance: 681 Ohm
Battery Voltage: 2.79 V
Brady Statistic AP VS Percent: 0 %
Brady Statistic AS VP Percent: 55 %
Brady Statistic AS VS Percent: 0 %
Lead Channel Impedance Value: 488 Ohm
Lead Channel Sensing Intrinsic Amplitude: 2.8 mV
Lead Channel Setting Pacing Amplitude: 2 V
Lead Channel Setting Pacing Amplitude: 2.5 V
Lead Channel Setting Pacing Pulse Width: 0.4 ms
Lead Channel Setting Sensing Sensitivity: 5.6 mV
MDC IDC MSMT BATTERY REMAINING LONGEVITY: 61 mo
MDC IDC MSMT LEADCHNL RA IMPEDANCE VALUE: 386 Ohm
MDC IDC SESS DTM: 20150630224706
MDC IDC STAT BRADY AP VP PERCENT: 45 %

## 2014-01-03 ENCOUNTER — Encounter: Payer: Self-pay | Admitting: Cardiology

## 2014-01-05 ENCOUNTER — Encounter: Payer: Self-pay | Admitting: Internal Medicine

## 2014-01-23 ENCOUNTER — Other Ambulatory Visit (HOSPITAL_BASED_OUTPATIENT_CLINIC_OR_DEPARTMENT_OTHER): Payer: Medicare Other

## 2014-01-23 ENCOUNTER — Ambulatory Visit (HOSPITAL_BASED_OUTPATIENT_CLINIC_OR_DEPARTMENT_OTHER): Payer: Medicare Other

## 2014-01-23 ENCOUNTER — Other Ambulatory Visit: Payer: Self-pay | Admitting: *Deleted

## 2014-01-23 DIAGNOSIS — D45 Polycythemia vera: Secondary | ICD-10-CM | POA: Diagnosis not present

## 2014-01-23 DIAGNOSIS — D751 Secondary polycythemia: Secondary | ICD-10-CM

## 2014-01-23 LAB — CBC WITH DIFFERENTIAL/PLATELET
BASO%: 0.7 % (ref 0.0–2.0)
BASOS ABS: 0.1 10*3/uL (ref 0.0–0.1)
EOS%: 4.5 % (ref 0.0–7.0)
Eosinophils Absolute: 0.9 10*3/uL — ABNORMAL HIGH (ref 0.0–0.5)
HEMATOCRIT: 48.2 % — AB (ref 34.8–46.6)
HGB: 14.4 g/dL (ref 11.6–15.9)
LYMPH#: 1.8 10*3/uL (ref 0.9–3.3)
LYMPH%: 8.6 % — ABNORMAL LOW (ref 14.0–49.7)
MCH: 18.9 pg — AB (ref 25.1–34.0)
MCHC: 29.9 g/dL — ABNORMAL LOW (ref 31.5–36.0)
MCV: 63.2 fL — ABNORMAL LOW (ref 79.5–101.0)
MONO#: 1.4 10*3/uL — AB (ref 0.1–0.9)
MONO%: 6.6 % (ref 0.0–14.0)
NEUT#: 16.6 10*3/uL — ABNORMAL HIGH (ref 1.5–6.5)
NEUT%: 79.6 % — AB (ref 38.4–76.8)
Platelets: 231 10*3/uL (ref 145–400)
RBC: 7.63 10*6/uL — ABNORMAL HIGH (ref 3.70–5.45)
RDW: 23.3 % — ABNORMAL HIGH (ref 11.2–14.5)
WBC: 20.9 10*3/uL — ABNORMAL HIGH (ref 3.9–10.3)
nRBC: 0 % (ref 0–0)

## 2014-01-23 LAB — TECHNOLOGIST REVIEW

## 2014-01-23 NOTE — Patient Instructions (Signed)

## 2014-01-23 NOTE — Progress Notes (Signed)
1355 VSS. Phlebotomy initiated with phleb kit, to R AC.  1410 515g phlebotomized, patient tolerated treatment with no complaints. Drink provided, patient refused snack.  1435 VSS. Patient with no complaints. Discharged ambulating and AVS provided. Patient knows to return in October for return appt.

## 2014-01-25 ENCOUNTER — Telehealth: Payer: Self-pay

## 2014-01-25 NOTE — Telephone Encounter (Signed)
LM with patient to continue taking Hydrea and to follow up as scheduled. Informed patient to call back with any questions or concerns.

## 2014-01-25 NOTE — Telephone Encounter (Signed)
Message copied by Tyler Aas A on Wed Jan 25, 2014 11:21 AM ------      Message from: [place], [place] K      Created: Wed Jan 25, 2014  9:03 AM       Please have her continue Hydrea as she is currently taking. F/u as scheduled.  ------

## 2014-03-16 ENCOUNTER — Telehealth: Payer: Self-pay | Admitting: Cardiology

## 2014-03-16 ENCOUNTER — Ambulatory Visit (INDEPENDENT_AMBULATORY_CARE_PROVIDER_SITE_OTHER): Payer: Medicare Other | Admitting: *Deleted

## 2014-03-16 DIAGNOSIS — I442 Atrioventricular block, complete: Secondary | ICD-10-CM

## 2014-03-16 NOTE — Telephone Encounter (Signed)
LMOVM reminding pt to send remote transmission.   

## 2014-03-17 NOTE — Progress Notes (Signed)
Remote pacemaker transmission.   

## 2014-03-20 LAB — MDC_IDC_ENUM_SESS_TYPE_REMOTE
Battery Remaining Longevity: 58 mo
Battery Voltage: 2.77 V
Brady Statistic AP VS Percent: 0 %
Brady Statistic AS VP Percent: 57 %
Lead Channel Impedance Value: 562 Ohm
Lead Channel Sensing Intrinsic Amplitude: 2.8 mV
Lead Channel Setting Pacing Pulse Width: 0.4 ms
MDC IDC MSMT BATTERY IMPEDANCE: 786 Ohm
MDC IDC MSMT LEADCHNL RA IMPEDANCE VALUE: 402 Ohm
MDC IDC SESS DTM: 20151001192438
MDC IDC SET LEADCHNL RA PACING AMPLITUDE: 2 V
MDC IDC SET LEADCHNL RV PACING AMPLITUDE: 2.5 V
MDC IDC SET LEADCHNL RV SENSING SENSITIVITY: 5.6 mV
MDC IDC STAT BRADY AP VP PERCENT: 43 %
MDC IDC STAT BRADY AS VS PERCENT: 0 %

## 2014-03-21 DIAGNOSIS — R258 Other abnormal involuntary movements: Secondary | ICD-10-CM | POA: Diagnosis not present

## 2014-03-21 DIAGNOSIS — Z950 Presence of cardiac pacemaker: Secondary | ICD-10-CM | POA: Diagnosis not present

## 2014-03-21 DIAGNOSIS — R0602 Shortness of breath: Secondary | ICD-10-CM | POA: Diagnosis not present

## 2014-03-21 DIAGNOSIS — Z8673 Personal history of transient ischemic attack (TIA), and cerebral infarction without residual deficits: Secondary | ICD-10-CM | POA: Diagnosis not present

## 2014-03-21 DIAGNOSIS — I495 Sick sinus syndrome: Secondary | ICD-10-CM | POA: Diagnosis not present

## 2014-03-21 DIAGNOSIS — I10 Essential (primary) hypertension: Secondary | ICD-10-CM | POA: Diagnosis not present

## 2014-03-27 ENCOUNTER — Encounter: Payer: Self-pay | Admitting: Cardiology

## 2014-03-28 ENCOUNTER — Other Ambulatory Visit (HOSPITAL_BASED_OUTPATIENT_CLINIC_OR_DEPARTMENT_OTHER): Payer: Medicare Other

## 2014-03-28 ENCOUNTER — Ambulatory Visit (HOSPITAL_BASED_OUTPATIENT_CLINIC_OR_DEPARTMENT_OTHER): Payer: Medicare Other

## 2014-03-28 ENCOUNTER — Telehealth: Payer: Self-pay | Admitting: Nurse Practitioner

## 2014-03-28 ENCOUNTER — Ambulatory Visit (HOSPITAL_BASED_OUTPATIENT_CLINIC_OR_DEPARTMENT_OTHER): Payer: Medicare Other | Admitting: Oncology

## 2014-03-28 VITALS — BP 153/60 | HR 63 | Temp 98.0°F | Resp 18 | Ht 62.0 in | Wt 124.9 lb

## 2014-03-28 VITALS — BP 123/58 | HR 67 | Resp 16

## 2014-03-28 DIAGNOSIS — D759 Disease of blood and blood-forming organs, unspecified: Secondary | ICD-10-CM

## 2014-03-28 DIAGNOSIS — D45 Polycythemia vera: Secondary | ICD-10-CM | POA: Diagnosis not present

## 2014-03-28 DIAGNOSIS — D751 Secondary polycythemia: Secondary | ICD-10-CM

## 2014-03-28 DIAGNOSIS — E611 Iron deficiency: Secondary | ICD-10-CM

## 2014-03-28 LAB — CBC WITH DIFFERENTIAL/PLATELET
BASO%: 0.7 % (ref 0.0–2.0)
BASOS ABS: 0.1 10*3/uL (ref 0.0–0.1)
EOS%: 4.9 % (ref 0.0–7.0)
Eosinophils Absolute: 1.1 10*3/uL — ABNORMAL HIGH (ref 0.0–0.5)
HEMATOCRIT: 46.4 % (ref 34.8–46.6)
HGB: 13.9 g/dL (ref 11.6–15.9)
LYMPH%: 9.5 % — ABNORMAL LOW (ref 14.0–49.7)
MCH: 19 pg — AB (ref 25.1–34.0)
MCHC: 30 g/dL — AB (ref 31.5–36.0)
MCV: 63.4 fL — ABNORMAL LOW (ref 79.5–101.0)
MONO#: 1.9 10*3/uL — ABNORMAL HIGH (ref 0.1–0.9)
MONO%: 8.7 % (ref 0.0–14.0)
NEUT#: 16.4 10*3/uL — ABNORMAL HIGH (ref 1.5–6.5)
NEUT%: 76.2 % (ref 38.4–76.8)
Platelets: 221 10*3/uL (ref 145–400)
RBC: 7.32 10*6/uL — ABNORMAL HIGH (ref 3.70–5.45)
RDW: 23.2 % — AB (ref 11.2–14.5)
WBC: 21.5 10*3/uL — ABNORMAL HIGH (ref 3.9–10.3)
lymph#: 2 10*3/uL (ref 0.9–3.3)
nRBC: 0 % (ref 0–0)

## 2014-03-28 LAB — TECHNOLOGIST REVIEW

## 2014-03-28 NOTE — Progress Notes (Signed)
  [facility] OFFICE PROGRESS NOTE   Diagnosis: Polycythemia vera  INTERVAL HISTORY:   [name] returns as scheduled. She feels well. She reports easy bruising at the dorsum of the hands and arms. No other complaint. No symptom of thrombosis. She last underwent phlebotomy in August.  Objective:  Vital signs in last 24 hours:  Blood pressure 153/60, temperature 98 F (36.7 C), temperature source Oral, resp. rate 18, height 5\' 2"  (1.575 m), weight 124 lb 14.4 oz (56.654 kg), SpO2 98.00%.    HEENT: No thrush or ulcers Resp: Lungs clear bilaterally Cardio: Regular rate and rhythm GI: No hepatosplenomegaly Vascular: No leg edema  Skin: Mild changes of senile purpura at the dorsum of the hands and lower arms     Lab Results:  Lab Results  Component Value Date   WBC 21.5* 03/28/2014   HGB 13.9 03/28/2014   HCT 46.4 03/28/2014   MCV 63.4* 03/28/2014   PLT 221 03/28/2014   NEUTROABS 16.4* 03/28/2014    Medications: I have reviewed the patient's current medications.  Assessment/Plan: 1. Polycythemia vera-JAK-2 positive. 2. Iron deficiency secondary to phlebotomy.   Disposition:  The hematocrit remains above the goal range (hematocrit less than 45%). She will be treated with a phlebotomy today. [name] will continue hydroxyurea at the current dose. She will return for a lab visit in 6 weeks and an office visit in 3 months.  Betsy Coder, MD  03/28/2014  10:49 AM

## 2014-03-28 NOTE — Addendum Note (Signed)
Addended by: Arty Baumgartner on: 03/28/2014 04:23 PM   Modules accepted: Orders

## 2014-03-28 NOTE — Progress Notes (Signed)
Phlebotomy performed via right AC for HCT 46.4.  500cc removed over approximately 5 minutes.  Pt tolerated well.  Drink given.  Pt observed for 30 minutes post procedure.  Vitals stable.

## 2014-03-28 NOTE — Patient Instructions (Signed)

## 2014-03-28 NOTE — Telephone Encounter (Signed)
Pt confirmed labs/ov per 10/13 POF, gave pt AVS.... KJ °

## 2014-03-30 ENCOUNTER — Encounter: Payer: Self-pay | Admitting: Internal Medicine

## 2014-05-09 ENCOUNTER — Other Ambulatory Visit (HOSPITAL_BASED_OUTPATIENT_CLINIC_OR_DEPARTMENT_OTHER): Payer: Medicare Other

## 2014-05-09 ENCOUNTER — Telehealth: Payer: Self-pay | Admitting: *Deleted

## 2014-05-09 DIAGNOSIS — D45 Polycythemia vera: Secondary | ICD-10-CM | POA: Diagnosis not present

## 2014-05-09 DIAGNOSIS — D759 Disease of blood and blood-forming organs, unspecified: Secondary | ICD-10-CM

## 2014-05-09 LAB — CBC WITH DIFFERENTIAL/PLATELET
BASO%: 0.7 % (ref 0.0–2.0)
BASOS ABS: 0.2 10*3/uL — AB (ref 0.0–0.1)
EOS ABS: 1.1 10*3/uL — AB (ref 0.0–0.5)
EOS%: 4.7 % (ref 0.0–7.0)
HCT: 44.6 % (ref 34.8–46.6)
HEMOGLOBIN: 13.3 g/dL (ref 11.6–15.9)
LYMPH#: 1.7 10*3/uL (ref 0.9–3.3)
LYMPH%: 7.1 % — ABNORMAL LOW (ref 14.0–49.7)
MCH: 19 pg — AB (ref 25.1–34.0)
MCHC: 29.8 g/dL — ABNORMAL LOW (ref 31.5–36.0)
MCV: 63.7 fL — ABNORMAL LOW (ref 79.5–101.0)
MONO#: 2.2 10*3/uL — AB (ref 0.1–0.9)
MONO%: 9.1 % (ref 0.0–14.0)
NEUT#: 18.4 10*3/uL — ABNORMAL HIGH (ref 1.5–6.5)
NEUT%: 78.4 % — ABNORMAL HIGH (ref 38.4–76.8)
Platelets: 189 10*3/uL (ref 145–400)
RBC: 7 10*6/uL — ABNORMAL HIGH (ref 3.70–5.45)
RDW: 23.2 % — AB (ref 11.2–14.5)
WBC: 23.5 10*3/uL — AB (ref 3.9–10.3)
nRBC: 1 % — ABNORMAL HIGH (ref 0–0)

## 2014-05-09 NOTE — Telephone Encounter (Signed)
Per Dr. Benay Spice; notified pt that hb slightly lower, cont. Hydrea and may need phlebotomy next visit.  Pt verbalized understanding of information.

## 2014-05-09 NOTE — Telephone Encounter (Signed)
-----   Message from Ladell Pier, MD sent at 05/09/2014  1:27 PM EST ----- Please call patient, hb slightly lower, cont. Hydrea, may need phlebotomy next visit

## 2014-06-20 ENCOUNTER — Ambulatory Visit (INDEPENDENT_AMBULATORY_CARE_PROVIDER_SITE_OTHER): Payer: Medicare Other | Admitting: *Deleted

## 2014-06-20 DIAGNOSIS — I442 Atrioventricular block, complete: Secondary | ICD-10-CM | POA: Diagnosis not present

## 2014-06-20 NOTE — Progress Notes (Signed)
Remote pacemaker transmission.   

## 2014-06-22 ENCOUNTER — Ambulatory Visit (HOSPITAL_BASED_OUTPATIENT_CLINIC_OR_DEPARTMENT_OTHER): Payer: Medicare Other | Admitting: Nurse Practitioner

## 2014-06-22 ENCOUNTER — Telehealth: Payer: Self-pay | Admitting: Oncology

## 2014-06-22 ENCOUNTER — Other Ambulatory Visit (HOSPITAL_BASED_OUTPATIENT_CLINIC_OR_DEPARTMENT_OTHER): Payer: Medicare Other

## 2014-06-22 VITALS — BP 162/60 | HR 84 | Temp 97.5°F | Resp 16 | Wt 127.4 lb

## 2014-06-22 DIAGNOSIS — D45 Polycythemia vera: Secondary | ICD-10-CM

## 2014-06-22 DIAGNOSIS — D508 Other iron deficiency anemias: Secondary | ICD-10-CM

## 2014-06-22 DIAGNOSIS — D751 Secondary polycythemia: Secondary | ICD-10-CM

## 2014-06-22 DIAGNOSIS — D759 Disease of blood and blood-forming organs, unspecified: Secondary | ICD-10-CM

## 2014-06-22 LAB — MDC_IDC_ENUM_SESS_TYPE_REMOTE
Brady Statistic AP VP Percent: 42 %
Brady Statistic AP VS Percent: 0 %
Brady Statistic AS VP Percent: 58 %
Brady Statistic AS VS Percent: 0 %
Lead Channel Impedance Value: 403 Ohm
Lead Channel Impedance Value: 535 Ohm
Lead Channel Setting Pacing Amplitude: 2 V
Lead Channel Setting Pacing Amplitude: 2.5 V
Lead Channel Setting Pacing Pulse Width: 0.4 ms
Lead Channel Setting Sensing Sensitivity: 5.6 mV
MDC IDC MSMT BATTERY IMPEDANCE: 865 Ohm
MDC IDC MSMT BATTERY REMAINING LONGEVITY: 55 mo
MDC IDC MSMT BATTERY VOLTAGE: 2.77 V
MDC IDC MSMT LEADCHNL RA SENSING INTR AMPL: 2.8 mV
MDC IDC SESS DTM: 20160105161528

## 2014-06-22 LAB — COMPREHENSIVE METABOLIC PANEL (CC13)
ALT: 26 U/L (ref 0–55)
ANION GAP: 8 meq/L (ref 3–11)
AST: 38 U/L — ABNORMAL HIGH (ref 5–34)
Albumin: 3.9 g/dL (ref 3.5–5.0)
Alkaline Phosphatase: 107 U/L (ref 40–150)
BILIRUBIN TOTAL: 0.96 mg/dL (ref 0.20–1.20)
BUN: 25.1 mg/dL (ref 7.0–26.0)
CHLORIDE: 107 meq/L (ref 98–109)
CO2: 28 meq/L (ref 22–29)
Calcium: 9.7 mg/dL (ref 8.4–10.4)
Creatinine: 0.8 mg/dL (ref 0.6–1.1)
EGFR: 63 mL/min/{1.73_m2} — ABNORMAL LOW (ref >90)
Glucose: 94 mg/dl (ref 70–140)
Potassium: 4.9 mEq/L (ref 3.5–5.1)
SODIUM: 142 meq/L (ref 136–145)
TOTAL PROTEIN: 6.8 g/dL (ref 6.4–8.3)

## 2014-06-22 LAB — CBC WITH DIFFERENTIAL/PLATELET
BASO%: 0.8 % (ref 0.0–2.0)
Basophils Absolute: 0.2 10*3/uL — ABNORMAL HIGH (ref 0.0–0.1)
EOS%: 4.3 % (ref 0.0–7.0)
Eosinophils Absolute: 0.9 10*3/uL — ABNORMAL HIGH (ref 0.0–0.5)
HEMATOCRIT: 44.1 % (ref 34.8–46.6)
HGB: 13 g/dL (ref 11.6–15.9)
LYMPH%: 8.4 % — ABNORMAL LOW (ref 14.0–49.7)
MCH: 18.8 pg — AB (ref 25.1–34.0)
MCHC: 29.5 g/dL — AB (ref 31.5–36.0)
MCV: 63.7 fL — ABNORMAL LOW (ref 79.5–101.0)
MONO#: 2.2 10*3/uL — ABNORMAL HIGH (ref 0.1–0.9)
MONO%: 10.8 % (ref 0.0–14.0)
NEUT#: 15.7 10*3/uL — ABNORMAL HIGH (ref 1.5–6.5)
NEUT%: 75.7 % (ref 38.4–76.8)
Platelets: 135 10*3/uL — ABNORMAL LOW (ref 145–400)
RBC: 6.92 10*6/uL — ABNORMAL HIGH (ref 3.70–5.45)
RDW: 24 % — ABNORMAL HIGH (ref 11.2–14.5)
WBC: 20.7 10*3/uL — ABNORMAL HIGH (ref 3.9–10.3)
lymph#: 1.7 10*3/uL (ref 0.9–3.3)
nRBC: 1 % — ABNORMAL HIGH (ref 0–0)

## 2014-06-22 LAB — TECHNOLOGIST REVIEW

## 2014-06-22 NOTE — Progress Notes (Signed)
  [facility] OFFICE PROGRESS NOTE   Diagnosis:  Polycythemia vera  INTERVAL HISTORY:   [name] returns as scheduled. Last phlebotomy was in October 2015. She continues Hydrea. She denies nausea/vomiting. She has occasional mild dyspnea on exertion. No chest pain. No leg swelling or calf pain. She continues to note easy bruising over the forearms and hands.  Objective:  Vital signs in last 24 hours:  Blood pressure 162/60, pulse 84, temperature 97.5 F (36.4 C), resp. rate 16, weight 127 lb 6.4 oz (57.788 kg).    HEENT: No thrush or ulcers. Resp: Lungs clear bilaterally. Cardio: Regular rate and rhythm. GI: Abdomen soft and nontender. No organomegaly. Vascular: No leg edema.     Lab Results:  Lab Results  Component Value Date   WBC 20.7* 06/22/2014   HGB 13.0 06/22/2014   HCT 44.1 06/22/2014   MCV 63.7* 06/22/2014   PLT 135* 06/22/2014   NEUTROABS 15.7* 06/22/2014    Imaging:  No results found.  Medications: I have reviewed the patient's current medications.  Assessment/Plan: 1. Polycythemia vera-JAK-2 positive. 2. Iron deficiency secondary to phlebotomy.   Disposition: [name] appears stable. The hematocrit is within goal range (hematocrit less than 45%). She will continue Hydrea at the current dose. She will return for a lab visit in 6 weeks and a follow-up visit in 3 months. She will contact the office in the interim with any problems.    Ned Card ANP/GNP-BC   06/22/2014  10:58 AM

## 2014-06-22 NOTE — Telephone Encounter (Signed)
gv and printed appt sched anda vs for pt for March.... °

## 2014-06-30 ENCOUNTER — Encounter: Payer: Self-pay | Admitting: Cardiology

## 2014-07-05 ENCOUNTER — Encounter: Payer: Self-pay | Admitting: Internal Medicine

## 2014-08-07 DIAGNOSIS — H04213 Epiphora due to excess lacrimation, bilateral lacrimal glands: Secondary | ICD-10-CM | POA: Diagnosis not present

## 2014-08-07 DIAGNOSIS — Z961 Presence of intraocular lens: Secondary | ICD-10-CM | POA: Diagnosis not present

## 2014-08-07 DIAGNOSIS — H35363 Drusen (degenerative) of macula, bilateral: Secondary | ICD-10-CM | POA: Diagnosis not present

## 2014-08-16 DIAGNOSIS — J209 Acute bronchitis, unspecified: Secondary | ICD-10-CM | POA: Diagnosis not present

## 2014-08-18 ENCOUNTER — Other Ambulatory Visit: Payer: Self-pay | Admitting: Hematology & Oncology

## 2014-08-29 DIAGNOSIS — I10 Essential (primary) hypertension: Secondary | ICD-10-CM | POA: Diagnosis not present

## 2014-08-29 DIAGNOSIS — Z950 Presence of cardiac pacemaker: Secondary | ICD-10-CM | POA: Diagnosis not present

## 2014-08-29 DIAGNOSIS — R0989 Other specified symptoms and signs involving the circulatory and respiratory systems: Secondary | ICD-10-CM | POA: Diagnosis not present

## 2014-08-29 DIAGNOSIS — Z8673 Personal history of transient ischemic attack (TIA), and cerebral infarction without residual deficits: Secondary | ICD-10-CM | POA: Diagnosis not present

## 2014-08-29 DIAGNOSIS — E785 Hyperlipidemia, unspecified: Secondary | ICD-10-CM | POA: Diagnosis not present

## 2014-08-29 DIAGNOSIS — D751 Secondary polycythemia: Secondary | ICD-10-CM | POA: Diagnosis not present

## 2014-08-29 DIAGNOSIS — R233 Spontaneous ecchymoses: Secondary | ICD-10-CM | POA: Diagnosis not present

## 2014-08-31 ENCOUNTER — Encounter: Payer: Self-pay | Admitting: Internal Medicine

## 2014-09-13 ENCOUNTER — Telehealth: Payer: Self-pay | Admitting: Oncology

## 2014-09-13 ENCOUNTER — Other Ambulatory Visit: Payer: Self-pay | Admitting: *Deleted

## 2014-09-13 NOTE — Telephone Encounter (Signed)
left voicemail for pt regarding to 3.31 appt cx and moved to 4.7 per pof...Marland KitchenMarland Kitchen

## 2014-09-14 ENCOUNTER — Other Ambulatory Visit: Payer: Medicare Other

## 2014-09-14 ENCOUNTER — Ambulatory Visit: Payer: Medicare Other | Admitting: Oncology

## 2014-09-18 ENCOUNTER — Ambulatory Visit (INDEPENDENT_AMBULATORY_CARE_PROVIDER_SITE_OTHER): Payer: Medicare Other | Admitting: Internal Medicine

## 2014-09-18 ENCOUNTER — Encounter: Payer: Self-pay | Admitting: Internal Medicine

## 2014-09-18 VITALS — BP 124/80 | HR 73 | Ht 62.5 in | Wt 126.4 lb

## 2014-09-18 DIAGNOSIS — I442 Atrioventricular block, complete: Secondary | ICD-10-CM | POA: Diagnosis not present

## 2014-09-18 DIAGNOSIS — I441 Atrioventricular block, second degree: Secondary | ICD-10-CM

## 2014-09-18 DIAGNOSIS — I10 Essential (primary) hypertension: Secondary | ICD-10-CM | POA: Insufficient documentation

## 2014-09-18 LAB — MDC_IDC_ENUM_SESS_TYPE_INCLINIC
Battery Impedance: 945 Ohm
Battery Remaining Longevity: 52 mo
Brady Statistic AP VP Percent: 40 %
Brady Statistic AS VS Percent: 0 %
Date Time Interrogation Session: 20160404134602
Lead Channel Impedance Value: 412 Ohm
Lead Channel Impedance Value: 508 Ohm
Lead Channel Pacing Threshold Amplitude: 0.75 V
Lead Channel Sensing Intrinsic Amplitude: 4 mV
Lead Channel Setting Pacing Amplitude: 2 V
Lead Channel Setting Pacing Amplitude: 2.5 V
Lead Channel Setting Sensing Sensitivity: 5.6 mV
MDC IDC MSMT BATTERY VOLTAGE: 2.78 V
MDC IDC MSMT LEADCHNL RA PACING THRESHOLD PULSEWIDTH: 0.4 ms
MDC IDC MSMT LEADCHNL RV PACING THRESHOLD AMPLITUDE: 0.5 V
MDC IDC MSMT LEADCHNL RV PACING THRESHOLD PULSEWIDTH: 0.4 ms
MDC IDC SET LEADCHNL RV PACING PULSEWIDTH: 0.4 ms
MDC IDC STAT BRADY AP VS PERCENT: 0 %
MDC IDC STAT BRADY AS VP PERCENT: 60 %

## 2014-09-18 NOTE — Patient Instructions (Signed)
Remote monitoring is used to monitor your pacemaker from home. This monitoring reduces the number of office visits required to check your device to one time per year. It allows [place] to keep an eye on the functioning of your device to ensure it is working properly. You are scheduled for a device check from home on 12-19-2014. You may send your transmission at any time that day. If you have a wireless device, the transmission will be sent automatically. After your physician reviews your transmission, you will receive a postcard with your next transmission date.  Your physician recommends that you schedule a follow-up appointment in: 12 months with Dr.Allred

## 2014-09-18 NOTE — Progress Notes (Signed)
Electrophysiology Office Note   Date:  09/18/2014   ID:  [name], DOB 08/12/1922, MRN [id number]  PCP:  Vidal Schwalbe, MD  Primary Electrophysiologist: Thompson Grayer, MD    Chief Complaint  Patient presents with  . Leg Pain     History of Present Illness: [name] is a 81 y.o. female who presents today for electrophysiology evaluation.   She is doing well.  She is not very active.  She woke with pain behind her R knee this am.  This is gradually improving. Today, she denies symptoms of palpitations, exertional chest pain, shortness of breath, orthopnea, PND, lower extremity edema, claudication, dizziness, presyncope, syncope, bleeding, or neurologic sequela. The patient is tolerating medications without difficulties and is otherwise without complaint today.    Past Medical History  Diagnosis Date  . Complete heart block   . Hypercholesterolemia   . Polycythemia vera(238.4)   . Syncope   . Hypertension   . Stroke    Past Surgical History  Procedure Laterality Date  . Pacemaker insertion  03/26/09    MDT Adapta DR, revised for RV lead fracture by Dr Doreatha Lew  . Hand surgery      S/P Carpal tunnel repair  . Appendectomy    . Transthoracic echocardiogram  03/26/2009  . US echocardiography  03/01/2009    EF 55-60%     Current Outpatient Prescriptions  Medication Sig Dispense Refill  . amLODipine (NORVASC) 5 MG tablet Take 5 mg by mouth daily.      Marland Kitchen aspirin 325 MG tablet Take 325 mg by mouth daily.     . beta carotene w/minerals (OCUVITE) tablet Take 1 tablet by mouth daily.      . calcium-vitamin D (OSCAL WITH D) 500-200 MG-UNIT per tablet Take 2 tablets by mouth daily.      . hydroxyurea (HYDREA) 500 MG capsule TAKE 1 CAPSULE (500 MG TOTAL) BY MOUTH ONCE A WEEK. MAY TAKE WITH FOOD TO MINIMIZE GI SIDE EFFECTS. 10 capsule 3  . Naproxen Sodium (ALEVE) 220 MG CAPS Take 1 capsule by mouth 2 (two) times daily.     Vladimir Faster Glycol-Propyl Glycol (SYSTANE  ULTRA OP) Apply 1 drop to eye 2 (two) times daily.     Marland Kitchen pyridOXINE (VITAMIN B-6) 100 MG tablet Take 100 mg by mouth daily.    . simvastatin (ZOCOR) 10 MG tablet Take 10 mg by mouth daily.       No current facility-administered medications for this visit.    Allergies:   Pollen extract   Social History:  The patient  reports that she has never smoked. She has never used smokeless tobacco. She reports that she does not drink alcohol or use illicit drugs.   Family History:  The patient's family history includes Asthma in her daughter; Canavan disease in her son; Deep vein thrombosis in her daughter; Depression in her daughter; Heart disease in her son; Migraines in her daughter and son; Other in her daughter; Pulmonary embolism in her daughter; Stroke (age of onset: 57) in her son. She was adopted.    ROS:  Please see the history of present illness.   All other systems are reviewed and negative.    PHYSICAL EXAM: VS:  BP 124/80 mmHg  Pulse 73  Ht 5' 2.5" (1.588 m)  Wt 126 lb 6.4 oz (57.335 kg)  BMI 22.74 kg/m2 , BMI Body mass index is 22.74 kg/(m^2). GEN: Well nourished, well developed, in no acute distress HEENT: normal Neck:  no JVD, carotid bruits, or masses Cardiac: RRR; no murmurs, rubs, or gallops,no edema  Respiratory:  clear to auscultation bilaterally, normal work of breathing GI: soft, nontender, nondistended, + BS MS: no deformity or atrophy, R knee looks fine Skin: warm and dry, device pocket is well healed Neuro:  Strength and sensation are intact Psych: euthymic mood, full affect  Device interrogation is reviewed today in detail.  See PaceArt for details.   Recent Labs: 06/22/2014: ALT 26; BUN 25.1; Creatinine 0.8; Hemoglobin 13.0; Platelets 135*; Potassium 4.9; Sodium 142    Lipid Panel     Component Value Date/Time   CHOL  03/09/2009 2030    127        ATP III CLASSIFICATION:  <200     mg/dL   Desirable  200-239  mg/dL   Borderline High  >=240    mg/dL    High          TRIG 86 03/09/2009 2030   HDL 37* 03/09/2009 2030   CHOLHDL 3.4 03/09/2009 2030   VLDL 17 03/09/2009 2030   [place]  03/09/2009 2030    73        Total Cholesterol/HDL:CHD Risk Coronary Heart Disease Risk Table                     Men   Women  1/2 Average Risk   3.4   3.3  Average Risk       5.0   4.4  2 X Average Risk   9.6   7.1  3 X Average Risk  23.4   11.0        Use the calculated Patient Ratio above and the CHD Risk Table to determine the patient's CHD Risk.        ATP III CLASSIFICATION (LDL):  <100     mg/dL   Optimal  100-129  mg/dL   Near or Above                    Optimal  130-159  mg/dL   Borderline  160-189  mg/dL   High  >190     mg/dL   Very High     Wt Readings from Last 3 Encounters:  09/18/14 126 lb 6.4 oz (57.335 kg)  06/22/14 127 lb 6.4 oz (57.788 kg)  03/28/14 124 lb 14.4 oz (56.654 kg)    ASSESSMENT AND PLAN:  1.  Complete heart block Normal pacemaker function See Pace Art report No changes today  2. HTN Stable No change required today  3. Prior stroke On ASA--> reports bruising Could reduce to 81mg  daily from my standpoint.  Would like Dr Ashok Cordia input first given polycythemia.  She may actually need to be on 325mg  daily long term.  If so, she will need to reduce her aleve.    4. R knee pain Exam is benign Supportive care Follow-up with PCP if not improved  Current medicines are reviewed at length with the patient today.   The patient does not have concerns regarding her medicines.  The following changes were made today:  none  Follow-up: carelink, return to see me in 1 year  Signed, Thompson Grayer, MD  09/18/2014 12:22 PM     [facility] [address] [place] [address] [phone] (office) [phone] (fax)

## 2014-09-21 ENCOUNTER — Ambulatory Visit (HOSPITAL_BASED_OUTPATIENT_CLINIC_OR_DEPARTMENT_OTHER): Payer: Medicare Other | Admitting: Nurse Practitioner

## 2014-09-21 ENCOUNTER — Telehealth: Payer: Self-pay | Admitting: Oncology

## 2014-09-21 ENCOUNTER — Other Ambulatory Visit (HOSPITAL_BASED_OUTPATIENT_CLINIC_OR_DEPARTMENT_OTHER): Payer: Medicare Other

## 2014-09-21 VITALS — BP 154/60 | HR 85 | Temp 98.2°F | Resp 17 | Ht 62.5 in | Wt 126.7 lb

## 2014-09-21 DIAGNOSIS — D508 Other iron deficiency anemias: Secondary | ICD-10-CM

## 2014-09-21 DIAGNOSIS — D751 Secondary polycythemia: Secondary | ICD-10-CM

## 2014-09-21 DIAGNOSIS — D45 Polycythemia vera: Secondary | ICD-10-CM

## 2014-09-21 LAB — CBC WITH DIFFERENTIAL/PLATELET
BASO%: 0.8 % (ref 0.0–2.0)
Basophils Absolute: 0.2 10*3/uL — ABNORMAL HIGH (ref 0.0–0.1)
EOS%: 3.6 % (ref 0.0–7.0)
Eosinophils Absolute: 0.9 10*3/uL — ABNORMAL HIGH (ref 0.0–0.5)
HCT: 44.2 % (ref 34.8–46.6)
HEMOGLOBIN: 13 g/dL (ref 11.6–15.9)
LYMPH%: 7.6 % — ABNORMAL LOW (ref 14.0–49.7)
MCH: 19.1 pg — AB (ref 25.1–34.0)
MCHC: 29.4 g/dL — ABNORMAL LOW (ref 31.5–36.0)
MCV: 65.1 fL — ABNORMAL LOW (ref 79.5–101.0)
MONO#: 2.4 10*3/uL — ABNORMAL HIGH (ref 0.1–0.9)
MONO%: 9.5 % (ref 0.0–14.0)
NEUT%: 78.5 % — ABNORMAL HIGH (ref 38.4–76.8)
NEUTROS ABS: 19.6 10*3/uL — AB (ref 1.5–6.5)
NRBC: 1 % — AB (ref 0–0)
Platelets: 145 10*3/uL (ref 145–400)
RBC: 6.79 10*6/uL — ABNORMAL HIGH (ref 3.70–5.45)
RDW: 24.8 % — AB (ref 11.2–14.5)
WBC: 25 10*3/uL — ABNORMAL HIGH (ref 3.9–10.3)
lymph#: 1.9 10*3/uL (ref 0.9–3.3)

## 2014-09-21 LAB — TECHNOLOGIST REVIEW

## 2014-09-21 NOTE — Telephone Encounter (Signed)
Gave patient avs report and appointments for May and June.  °

## 2014-09-21 NOTE — Progress Notes (Signed)
  [facility] OFFICE PROGRESS NOTE   Diagnosis:  Polycythemia vera  INTERVAL HISTORY:   [name] returns as scheduled. She continues 500 mg once weekly. He denies nausea/vomiting. No mouth sores. No skin rash. No diarrhea. Appetite is stable. She notes easy bruising mainly over the arms.  Objective:  Vital signs in last 24 hours:  Blood pressure 154/60, pulse 85, temperature 98.2 F (36.8 C), temperature source Oral, resp. rate 17, height 5' 2.5" (1.588 m), weight 126 lb 11.2 oz (57.471 kg), SpO2 97 %.    HEENT: No thrush or ulcers. Resp: Lungs clear bilaterally. Cardio: Regular rate and rhythm. GI: Abdomen soft and nontender. No organomegaly. Vascular: No leg edema. Calves soft and nontender.  Skin: Resolving ecchymoses scattered over the arms.    Lab Results:  Lab Results  Component Value Date   WBC 25.0* 09/21/2014   HGB 13.0 09/21/2014   HCT 44.2 09/21/2014   MCV 65.1* 09/21/2014   PLT 145 09/21/2014   NEUTROABS 19.6* 09/21/2014    Imaging:  No results found.  Medications: I have reviewed the patient's current medications.  Assessment/Plan: 1. Polycythemia vera-JAK-2 positive. 2. Iron deficiency secondary to phlebotomy.   Disposition: [name] remains stable from a hematologic standpoint. The hematocrit remains within goal range (less than 45%). She will continue Hydrea at the current dose. We scheduled a 6 week lab visit and a 3 month office visit.  She is maintained on aspirin and reports easy bruising. Dr. Rayann Heman was okay with reducing to 81 mg daily. Dr. Benay Spice is in agreement. She was given instructions to decrease aspirin to 81 mg daily.  Ned Card ANP/GNP-BC   09/21/2014  3:15 PM

## 2014-10-16 DIAGNOSIS — H02105 Unspecified ectropion of left lower eyelid: Secondary | ICD-10-CM | POA: Diagnosis not present

## 2014-10-16 DIAGNOSIS — H04222 Epiphora due to insufficient drainage, left lacrimal gland: Secondary | ICD-10-CM | POA: Diagnosis not present

## 2014-10-16 DIAGNOSIS — H02102 Unspecified ectropion of right lower eyelid: Secondary | ICD-10-CM | POA: Diagnosis not present

## 2014-10-16 DIAGNOSIS — H04221 Epiphora due to insufficient drainage, right lacrimal gland: Secondary | ICD-10-CM | POA: Diagnosis not present

## 2014-10-23 ENCOUNTER — Ambulatory Visit (INDEPENDENT_AMBULATORY_CARE_PROVIDER_SITE_OTHER): Payer: Medicare Other | Admitting: Podiatry

## 2014-10-23 ENCOUNTER — Encounter: Payer: Self-pay | Admitting: Podiatry

## 2014-10-23 VITALS — BP 126/63 | HR 72 | Resp 18

## 2014-10-23 DIAGNOSIS — M79676 Pain in unspecified toe(s): Secondary | ICD-10-CM | POA: Diagnosis not present

## 2014-10-23 DIAGNOSIS — B351 Tinea unguium: Secondary | ICD-10-CM | POA: Diagnosis not present

## 2014-10-23 NOTE — Patient Instructions (Signed)
Over the counter treatment for toenail fungus is called fungi-nail  

## 2014-10-23 NOTE — Progress Notes (Signed)
   Subjective:    Patient ID: [name], female    DOB: 12/01/1922, 81 y.o.   MRN: [id number]  HPI  81 year old female presents the office today with complaints of painful, thick, discolored toenails. She says that over the last several my she is redness increased discoloration to the nails. She denies any redness or drainage on the nail sites. She is unable trim the nails herself. No other complaints at this time.  Review of Systems  All other systems reviewed and are negative.      Objective:   Physical Exam AAO x3, NAD DP/PT pulses palpable bilaterally, CRT less than 3 seconds Protective sensation intact with Simms Weinstein monofilament, vibratory sensation intact, Achilles tendon reflex intact Nails are hypertrophic, dystrophic, elongated, brittle, discolored 10. There is no surrounding erythema or drainage on the nail sites. There is tenderness to palpation overlying nails 1-5 bilaterally. No areas of tenderness to bilateral lower extremities. MMT 5/5, ROM WNL.  No open lesions or pre-ulcerative lesions.  No overlying edema, erythema, increase in warmth to bilateral lower extremities.  No pain with calf compression, swelling, warmth, erythema bilaterally.         Assessment & Plan:  81 year old female with symptomatic onychomycosis -Treatment options were discussed the patient including alternatives, risks, complications. -Nail sharply debrided 10 without complication/bleeding -Discussed treatment options for onychomycosis. At this time she will proceed with over-the-counter treatment. I discussed with her Fungi-Nail. -Discussed the importance of daily foot inspection. -Follow-up in 3 months or sooner if any problems are to arise. In the meantime, call the office with any questions, concerns, change in symptoms.

## 2014-11-01 ENCOUNTER — Other Ambulatory Visit (HOSPITAL_BASED_OUTPATIENT_CLINIC_OR_DEPARTMENT_OTHER): Payer: Medicare Other

## 2014-11-01 ENCOUNTER — Telehealth: Payer: Self-pay | Admitting: *Deleted

## 2014-11-01 DIAGNOSIS — D45 Polycythemia vera: Secondary | ICD-10-CM

## 2014-11-01 DIAGNOSIS — D508 Other iron deficiency anemias: Secondary | ICD-10-CM | POA: Diagnosis not present

## 2014-11-01 LAB — CBC WITH DIFFERENTIAL/PLATELET
BASO%: 0.8 % (ref 0.0–2.0)
BASOS ABS: 0.2 10*3/uL — AB (ref 0.0–0.1)
EOS ABS: 1 10*3/uL — AB (ref 0.0–0.5)
EOS%: 3.7 % (ref 0.0–7.0)
HEMATOCRIT: 44.8 % (ref 34.8–46.6)
HGB: 13.3 g/dL (ref 11.6–15.9)
LYMPH%: 6.7 % — ABNORMAL LOW (ref 14.0–49.7)
MCH: 19.3 pg — ABNORMAL LOW (ref 25.1–34.0)
MCHC: 29.7 g/dL — ABNORMAL LOW (ref 31.5–36.0)
MCV: 65 fL — ABNORMAL LOW (ref 79.5–101.0)
MONO#: 1.9 10*3/uL — AB (ref 0.1–0.9)
MONO%: 7.3 % (ref 0.0–14.0)
NEUT%: 81.5 % — AB (ref 38.4–76.8)
NEUTROS ABS: 21.4 10*3/uL — AB (ref 1.5–6.5)
PLATELETS: 151 10*3/uL (ref 145–400)
RBC: 6.89 10*6/uL — ABNORMAL HIGH (ref 3.70–5.45)
RDW: 24.5 % — ABNORMAL HIGH (ref 11.2–14.5)
WBC: 26.3 10*3/uL — ABNORMAL HIGH (ref 3.9–10.3)
lymph#: 1.8 10*3/uL (ref 0.9–3.3)
nRBC: 0 % (ref 0–0)

## 2014-11-01 LAB — TECHNOLOGIST REVIEW

## 2014-11-01 NOTE — Telephone Encounter (Signed)
-----   Message from Owens Shark, NP sent at 11/01/2014  4:17 PM EDT ----- Please have her continue the same dose of Hydrea. Follow-up as scheduled.

## 2014-11-01 NOTE — Telephone Encounter (Signed)
-----   Message from Ladell Pier, MD sent at 11/01/2014  4:29 PM EDT ----- Please call patient, same hydrea, will need phlebotomy if hb any higher next month

## 2014-11-01 NOTE — Telephone Encounter (Signed)
Called and informed patient to continue same dose of Hydrea and to follow up as scheduled.  Per Elby Showers. Thomas,NP.  Patient verbalized understanding.

## 2014-11-03 ENCOUNTER — Telehealth: Payer: Self-pay | Admitting: Oncology

## 2014-11-03 NOTE — Telephone Encounter (Signed)
Phlebotomy added to lab and md visit  anne

## 2014-11-25 ENCOUNTER — Emergency Department (INDEPENDENT_AMBULATORY_CARE_PROVIDER_SITE_OTHER)
Admission: EM | Admit: 2014-11-25 | Discharge: 2014-11-25 | Disposition: A | Discharge status: Home / Self Care | Payer: Medicare Other | Source: Home / Self Care | Attending: Family Medicine | Admitting: Family Medicine

## 2014-11-25 ENCOUNTER — Emergency Department (INDEPENDENT_AMBULATORY_CARE_PROVIDER_SITE_OTHER): Payer: Medicare Other

## 2014-11-25 ENCOUNTER — Encounter (HOSPITAL_COMMUNITY): Payer: Self-pay | Admitting: Emergency Medicine

## 2014-11-25 DIAGNOSIS — K5900 Constipation, unspecified: Secondary | ICD-10-CM

## 2014-11-25 DIAGNOSIS — R161 Splenomegaly, not elsewhere classified: Secondary | ICD-10-CM

## 2014-11-25 NOTE — ED Notes (Signed)
Reports pain in left flank, radiating into abdomen, associated with nausea.  Onset of pain was Wednesday 6/8.  Last bm was Tuesday.  Reports bowel movements are commonly several hard, small stools a day

## 2014-11-25 NOTE — Discharge Instructions (Signed)
Constipation Miralax as directed Increase fiber and water in diet  Constipation is when a person:  Poops (has a bowel movement) less than 3 times a week.  Has a hard time pooping.  Has poop that is dry, hard, or bigger than normal. HOME CARE   Eat foods with a lot of fiber in them. This includes fruits, vegetables, beans, and whole grains such as brown rice.  Avoid fatty foods and foods with a lot of sugar. This includes french fries, hamburgers, cookies, candy, and soda.  If you are not getting enough fiber from food, take products with added fiber in them (supplements).  Drink enough fluid to keep your pee (urine) clear or pale yellow.  Exercise on a regular basis, or as told by your doctor.  Go to the restroom when you feel like you need to poop. Do not hold it.  Only take medicine as told by your doctor. Do not take medicines that help you poop (laxatives) without talking to your doctor first. GET HELP RIGHT AWAY IF:   You have bright red blood in your poop (stool).  Your constipation lasts more than 4 days or gets worse.  You have belly (abdominal) or butt (rectal) pain.  You have thin poop (as thin as a pencil).  You lose weight, and it cannot be explained. MAKE SURE YOU:   Understand these instructions.  Will watch your condition.  Will get help right away if you are not doing well or get worse. Document Released: 11/19/2007 Document Revised: 06/07/2013 Document Reviewed: 03/14/2013 [facility] Patient Information 2015 [place], [place]. This information is not intended to replace advice given to you by your health care provider. Make sure you discuss any questions you have with your health care provider.  Enlarged Spleen The spleen is an organ located in the upper abdomen under your left ribs. It is a spongelike organ, about the size of an orange, which acts as a filter. The spleen is part of the lymph system and filters the blood. It removes old blood cells and  abnormal blood cells. It is also part of the immune response and helps fight infections. An enlarged spleen (splenomegaly) is usually noticed when it is almost twice its normal size. CAUSES  There are many possible causes of an enlarged spleen. These causes include:  Infections (viral, bacterial, or parasitic).  Liver cirrhosis and other liver diseases.  Hemolytic anemia (types of anemia that lower your red blood cell count) and other blood diseases.  Hypersplenism (reduction in many types of blood cells by an enlarged spleen).  Blood cancers (leukemia, Hodgkin's disease).  Metabolic disorders (Gaucher's disease, Niemann-Pick disease).  Tumors and cysts.  Pressure or blood clots in the veins of the spleen.  Connective tissue disorders (lupus, rheumatoid arthritis with Felty's syndrome). SYMPTOMS  An enlarged spleen may not always cause symptoms. If symptoms do occur, they may include:  Pain in the upper left abdomen (pain may spread to the left shoulder or get worse when you take a breath).  Feeling full without eating or eating only a small amount.  Feeling tired.  Chronic infections.  Bleeding easily. DIAGNOSIS  Tests may include:  Physical examination of the left upper abdomen.  Blood tests to check red and white blood cells and other proteins and enzymes.  Imaging tests, such as abdominal ultrasonography, computerized X-ray scan (computed tomography, CT), and computerized magnetic scan (magnetic resonance imaging, MRI).  Taking a tissue sample (biopsy) of the liver to examine it.  Examining  a bone marrow biopsy sample. TREATMENT  Treatment varies depending on the cause of the enlarged spleen. Treatment aims to manage the conditions that cause swelling of the spleen and reduce the size of the spleen. Treatment may include:  Medications to eliminate infection or treat disease.  Radiation therapy.  Blood transfusions.  Vaccinations. If these treatments are not  successful, or the cause cannot be determined, surgery to remove the spleen (splenectomy) may be recommended. HOME CARE INSTRUCTIONS   Take all medications as directed.  Take all antibiotics, even if you start to feel better. Discuss with your caregiver the use of a probiotic supplement to prevent stomach upset.  To avoid injury or a ruptured spleen:  Limit activities as directed.  Avoid contact sports.  Wear your seat belt in the car.  See your caregiver for vaccinations, follow up examinations and testing as directed.  Follow all of your caregiver's instructions on managing the conditions that cause your enlarged spleen. PREVENTION  It is not always possible to prevent an enlarged spleen. Reduce your chances of developing an enlarged spleen:  Practice good hygiene to prevent infection.  Get recommended vaccines to prevent infection. SEEK MEDICAL CARE IF:   You develop a fever (more than 100.37F [38.1 C]) or other signs of infection (chills, feeling unwell).  You experience injury or impact to the spleen area.  Your symptoms do not go away as you and your doctor expected.  You experience increased pain when you take in a breath.  Your symptoms worsen, or you develop new symptoms. MAKE SURE YOU:   Understand these instructions.  Will watch your condition.  Will get help right away if you are not doing well or get worse. Follow up with your caregiver to find out the results of your tests. Not all test results may be available during your visit. If your test results are not back during the visit, make an appointment with your caregiver to find out the results. Do not assume everything is normal if you have not heard from your caregiver or the medical facility. It is important for you to follow up on all of your test results.  Document Released: 11/20/2009 Document Revised: 10/17/2013 Document Reviewed: 11/20/2009 [facility] Patient Information 2015 [place], [place]. This  information is not intended to replace advice given to you by your health care provider. Make sure you discuss any questions you have with your health care provider.  Abdominal Pain Many things can cause belly (abdominal) pain. Most times, the belly pain is not dangerous. Many cases of belly pain can be watched and treated at home. HOME CARE   Do not take medicines that help you go poop (laxatives) unless told to by your doctor.  Only take medicine as told by your doctor.  Eat or drink as told by your doctor. Your doctor will tell you if you should be on a special diet. GET HELP IF:  You do not know what is causing your belly pain.  You have belly pain while you are sick to your stomach (nauseous) or have runny poop (diarrhea).  You have pain while you pee or poop.  Your belly pain wakes you up at night.  You have belly pain that gets worse or better when you eat.  You have belly pain that gets worse when you eat fatty foods.  You have a fever. GET HELP RIGHT AWAY IF:   The pain does not go away within 2 hours.  You keep throwing up (vomiting).  The pain changes and is only in the right or left part of the belly.  You have bloody or tarry looking poop. MAKE SURE YOU:   Understand these instructions.  Will watch your condition.  Will get help right away if you are not doing well or get worse. Document Released: 11/19/2007 Document Revised: 06/07/2013 Document Reviewed: 02/09/2013 [facility] Patient Information 2015 [place], [place]. This information is not intended to replace advice given to you by your health care provider. Make sure you discuss any questions you have with your health care provider.

## 2014-11-25 NOTE — ED Provider Notes (Signed)
CSN: [id number]     Arrival date & time 11/25/14  1301 History   First MD Initiated Contact with Patient 11/25/14 1311     Chief Complaint  Patient presents with  . Abdominal Pain  . Nausea   (Consider location/radiation/quality/duration/timing/severity/associated sxs/prior Treatment) HPI Comments: 81 year old female who considers herself to be in generally good health at this age is complaining of abdominal pain. The bulk of the pain is primarily left hemiabdomen. She started to have pain 4-5 days ago. She also notes that she has not had a bowel movement for 4-5 days. Despite having bowel movements she would have small hard brown stools in small volumes. 4 days ago she had an episode of vomiting and regurgitation/reflux. She describes the abdominal pain is crampy worse in the left lower quadrant then migrates across the abdomen. It is sudden, sharp and crampy. She denies upper respiratory symptoms, shortness of breath, chest pain or extremity pain. She is fully awake, alert, lucid, speaking clearly and goal oriented.   Past Medical History  Diagnosis Date  . Complete heart block   . Hypercholesterolemia   . Polycythemia vera(238.4)   . Syncope   . Hypertension   . Stroke    Past Surgical History  Procedure Laterality Date  . Pacemaker insertion  03/26/09    MDT Adapta DR, revised for RV lead fracture by Dr Doreatha Lew  . Hand surgery      S/P Carpal tunnel repair  . Appendectomy    . Transthoracic echocardiogram  03/26/2009  . US echocardiography  03/01/2009    EF 55-60%   Family History  Problem Relation Age of Onset  . Adopted: Yes  . Migraines Son   . Heart disease Son     CABGx5  . Stroke Son 4  . Canavan disease Son     prostate  . Pulmonary embolism Daughter     x2 (age 35 & 55)  . Deep vein thrombosis Daughter   . Asthma Daughter   . Migraines Daughter   . Depression Daughter   . Other Daughter     Acid reflux   History  Substance Use Topics  . Smoking status:  Never Smoker   . Smokeless tobacco: Never Used     Comment: never smoked  . Alcohol Use: No   OB History    No data available     Review of Systems  Constitutional: Negative.   HENT: Negative.   Respiratory:       Patient denies respiratory symptoms. Patient's significant other states that he and a couple of others at home and noticed periods of time which they felt that she was short of breath.  Cardiovascular: Negative for leg swelling.  Gastrointestinal: Positive for vomiting, abdominal pain and constipation. Negative for diarrhea, blood in stool and abdominal distention.  Genitourinary: Negative.   Musculoskeletal: Negative.   Skin: Negative.   Neurological: Negative.     Allergies  Pollen extract  Home Medications   Prior to Admission medications   Medication Sig Start Date End Date Taking? Authorizing Provider  amLODipine (NORVASC) 5 MG tablet Take 5 mg by mouth daily.      Historical Provider, MD  aspirin 325 MG tablet Take 325 mg by mouth daily.     Historical Provider, MD  beta carotene w/minerals (OCUVITE) tablet Take 1 tablet by mouth daily.      Historical Provider, MD  calcium-vitamin D (OSCAL WITH D) 500-200 MG-UNIT per tablet Take 2 tablets by mouth daily.  Historical Provider, MD  hydroxyurea (HYDREA) 500 MG capsule TAKE 1 CAPSULE (500 MG TOTAL) BY MOUTH ONCE A WEEK. MAY TAKE WITH FOOD TO MINIMIZE GI SIDE EFFECTS. 08/18/14   Volanda Napoleon, MD  Naproxen Sodium (ALEVE) 220 MG CAPS Take 1 capsule by mouth 2 (two) times daily.     Historical Provider, MD  Polyethyl Glycol-Propyl Glycol (SYSTANE ULTRA OP) Apply 1 drop to eye 2 (two) times daily.     Historical Provider, MD  pyridOXINE (VITAMIN B-6) 100 MG tablet Take 100 mg by mouth daily.    Historical Provider, MD  simvastatin (ZOCOR) 10 MG tablet Take 10 mg by mouth daily.      Historical Provider, MD   BP 166/78 mmHg  Pulse 79  Temp(Src) 98 F (36.7 C) (Oral)  Resp 12  SpO2 98% Physical Exam   Constitutional: She is oriented to person, place, and time. She appears well-developed and well-nourished. No distress.  Eyes: EOM are normal.  Neck: Normal range of motion. Neck supple.  Cardiovascular: Normal rate and intact distal pulses.   A few skipped beats. Patient has a history of complete heart block and currently has a demand pacemaker.  Pulmonary/Chest: Effort normal and breath sounds normal. No respiratory distress. She has no wheezes. She has no rales.  Abdominal: Soft. Bowel sounds are normal. She exhibits no mass. There is no rebound and no guarding.   Tenderness in various areas of the left hemiabdomen. There is also mild tenderness to the right mid abdomen.   Musculoskeletal: Normal range of motion. She exhibits no edema.  Neurological: She is alert and oriented to person, place, and time. She exhibits normal muscle tone.  Skin: Skin is warm and dry.  Psychiatric: She has a normal mood and affect.  Nursing note and vitals reviewed.   ED Course  Procedures (including critical care time) Labs Review Labs Reviewed - No data to display  Imaging Review Dg Abd 2 Views  11/25/2014   CLINICAL DATA:  Abdominal pain for 4 days  EXAM: ABDOMEN - 2 VIEW  COMPARISON:  None.  FINDINGS: Scattered large and small bowel gas is noted. Fecal material is noted throughout the colon although no obstructive changes are seen. The spleen is enlarged of uncertain significance. No acute bony abnormality is seen. No obstructive changes are noted.  IMPRESSION: Splenomegaly.  Otherwise nonspecific abdomen.   Electronically Signed   By: Inez Catalina M.D.   On: 11/25/2014 14:24     MDM   1. Constipation, unspecified constipation type   2. Enlargement, spleen    Miralax as directed Increase fiber and water in diet See your doctor next week re enlarged spleen/liver For worsening seek med attn promptly    Janne Napoleon, NP 11/25/14 1439

## 2014-11-27 ENCOUNTER — Emergency Department (HOSPITAL_COMMUNITY): Payer: Medicare Other

## 2014-11-27 ENCOUNTER — Inpatient Hospital Stay (HOSPITAL_COMMUNITY)
Admission: EM | Admit: 2014-11-27 | Discharge: 2014-12-01 | DRG: 176 | Disposition: A | Discharge status: Home / Self Care | Payer: Medicare Other | Attending: Internal Medicine | Admitting: Internal Medicine

## 2014-11-27 ENCOUNTER — Inpatient Hospital Stay (HOSPITAL_COMMUNITY): Payer: Medicare Other

## 2014-11-27 ENCOUNTER — Encounter (HOSPITAL_COMMUNITY): Payer: Self-pay | Admitting: Family Medicine

## 2014-11-27 DIAGNOSIS — I442 Atrioventricular block, complete: Secondary | ICD-10-CM | POA: Diagnosis present

## 2014-11-27 DIAGNOSIS — R079 Chest pain, unspecified: Secondary | ICD-10-CM

## 2014-11-27 DIAGNOSIS — K922 Gastrointestinal hemorrhage, unspecified: Secondary | ICD-10-CM | POA: Diagnosis not present

## 2014-11-27 DIAGNOSIS — R109 Unspecified abdominal pain: Secondary | ICD-10-CM | POA: Diagnosis not present

## 2014-11-27 DIAGNOSIS — D259 Leiomyoma of uterus, unspecified: Secondary | ICD-10-CM | POA: Diagnosis not present

## 2014-11-27 DIAGNOSIS — K921 Melena: Secondary | ICD-10-CM | POA: Diagnosis not present

## 2014-11-27 DIAGNOSIS — E780 Pure hypercholesterolemia: Secondary | ICD-10-CM | POA: Diagnosis present

## 2014-11-27 DIAGNOSIS — Z950 Presence of cardiac pacemaker: Secondary | ICD-10-CM | POA: Diagnosis not present

## 2014-11-27 DIAGNOSIS — R072 Precordial pain: Secondary | ICD-10-CM

## 2014-11-27 DIAGNOSIS — K2960 Other gastritis without bleeding: Secondary | ICD-10-CM | POA: Diagnosis present

## 2014-11-27 DIAGNOSIS — K5900 Constipation, unspecified: Secondary | ICD-10-CM | POA: Diagnosis present

## 2014-11-27 DIAGNOSIS — J9811 Atelectasis: Secondary | ICD-10-CM | POA: Diagnosis not present

## 2014-11-27 DIAGNOSIS — Z8673 Personal history of transient ischemic attack (TIA), and cerebral infarction without residual deficits: Secondary | ICD-10-CM

## 2014-11-27 DIAGNOSIS — N2889 Other specified disorders of kidney and ureter: Secondary | ICD-10-CM | POA: Diagnosis not present

## 2014-11-27 DIAGNOSIS — Z7982 Long term (current) use of aspirin: Secondary | ICD-10-CM | POA: Diagnosis not present

## 2014-11-27 DIAGNOSIS — I4891 Unspecified atrial fibrillation: Secondary | ICD-10-CM | POA: Diagnosis not present

## 2014-11-27 DIAGNOSIS — K2961 Other gastritis with bleeding: Secondary | ICD-10-CM | POA: Diagnosis not present

## 2014-11-27 DIAGNOSIS — I10 Essential (primary) hypertension: Secondary | ICD-10-CM | POA: Diagnosis present

## 2014-11-27 DIAGNOSIS — E785 Hyperlipidemia, unspecified: Secondary | ICD-10-CM | POA: Diagnosis present

## 2014-11-27 DIAGNOSIS — Z7901 Long term (current) use of anticoagulants: Secondary | ICD-10-CM

## 2014-11-27 DIAGNOSIS — M25512 Pain in left shoulder: Secondary | ICD-10-CM | POA: Diagnosis not present

## 2014-11-27 DIAGNOSIS — Z791 Long term (current) use of non-steroidal anti-inflammatories (NSAID): Secondary | ICD-10-CM

## 2014-11-27 DIAGNOSIS — R161 Splenomegaly, not elsewhere classified: Secondary | ICD-10-CM | POA: Diagnosis present

## 2014-11-27 DIAGNOSIS — K7689 Other specified diseases of liver: Secondary | ICD-10-CM | POA: Diagnosis not present

## 2014-11-27 DIAGNOSIS — D45 Polycythemia vera: Secondary | ICD-10-CM | POA: Diagnosis not present

## 2014-11-27 DIAGNOSIS — K259 Gastric ulcer, unspecified as acute or chronic, without hemorrhage or perforation: Secondary | ICD-10-CM | POA: Diagnosis present

## 2014-11-27 DIAGNOSIS — D509 Iron deficiency anemia, unspecified: Secondary | ICD-10-CM | POA: Diagnosis present

## 2014-11-27 DIAGNOSIS — K5730 Diverticulosis of large intestine without perforation or abscess without bleeding: Secondary | ICD-10-CM | POA: Diagnosis not present

## 2014-11-27 DIAGNOSIS — I2699 Other pulmonary embolism without acute cor pulmonale: Secondary | ICD-10-CM | POA: Diagnosis not present

## 2014-11-27 DIAGNOSIS — D72829 Elevated white blood cell count, unspecified: Secondary | ICD-10-CM | POA: Diagnosis not present

## 2014-11-27 DIAGNOSIS — K250 Acute gastric ulcer with hemorrhage: Secondary | ICD-10-CM | POA: Diagnosis not present

## 2014-11-27 LAB — BASIC METABOLIC PANEL
Anion gap: 9 (ref 5–15)
BUN: 21 mg/dL — ABNORMAL HIGH (ref 6–20)
CALCIUM: 9.6 mg/dL (ref 8.9–10.3)
CO2: 27 mmol/L (ref 22–32)
Chloride: 104 mmol/L (ref 101–111)
Creatinine, Ser: 1.03 mg/dL — ABNORMAL HIGH (ref 0.44–1.00)
GFR calc Af Amer: 54 mL/min — ABNORMAL LOW (ref >60)
GFR calc non Af Amer: 46 mL/min — ABNORMAL LOW (ref >60)
GLUCOSE: 126 mg/dL — AB (ref 65–99)
Potassium: 4.3 mmol/L (ref 3.5–5.1)
SODIUM: 140 mmol/L (ref 135–145)

## 2014-11-27 LAB — CBC
HCT: 47.3 % — ABNORMAL HIGH (ref 36.0–46.0)
HEMOGLOBIN: 14.3 g/dL (ref 12.0–15.0)
MCH: 19.5 pg — AB (ref 26.0–34.0)
MCHC: 30.2 g/dL (ref 30.0–36.0)
MCV: 64.4 fL — AB (ref 78.0–100.0)
Platelets: 176 10*3/uL (ref 150–400)
RBC: 7.35 MIL/uL — AB (ref 3.87–5.11)
RDW: 24.5 % — AB (ref 11.5–15.5)
WBC: 26.3 10*3/uL — AB (ref 4.0–10.5)

## 2014-11-27 LAB — TROPONIN I: Troponin I: <0.03 ng/mL (ref <0.031)

## 2014-11-27 LAB — I-STAT TROPONIN, ED: Troponin i, poc: 0.04 ng/mL (ref 0.00–0.08)

## 2014-11-27 MED ORDER — SIMVASTATIN 10 MG PO TABS
10.0000 mg | ORAL_TABLET | Freq: Every day | ORAL | Status: DC
  Administered 2014-11-28 – 2014-11-30 (×3): 10 mg via ORAL
  Filled 2014-11-27 (×4): qty 1

## 2014-11-27 MED ORDER — HYDROXYUREA 500 MG PO CAPS: 500.0000 mg | ORAL_CAPSULE | ORAL | Status: DC

## 2014-11-27 MED ORDER — DOCUSATE SODIUM 100 MG PO CAPS
100.0000 mg | ORAL_CAPSULE | Freq: Two times a day (BID) | ORAL | Status: DC
  Administered 2014-11-27 – 2014-11-30 (×6): 100 mg via ORAL
  Filled 2014-11-27 (×11): qty 1

## 2014-11-27 MED ORDER — IOHEXOL 300 MG/ML  SOLN
25.0000 mL | Freq: Once | INTRAMUSCULAR | Status: AC | PRN
  Administered 2014-11-27: 25 mL via ORAL

## 2014-11-27 MED ORDER — OCUVITE PO TABS
1.0000 | ORAL_TABLET | Freq: Every day | ORAL | Status: DC
  Administered 2014-11-27 – 2014-11-30 (×4): 1 via ORAL
  Filled 2014-11-27 (×5): qty 1

## 2014-11-27 MED ORDER — SODIUM CHLORIDE 0.9 % IJ SOLN
3.0000 mL | Freq: Two times a day (BID) | INTRAMUSCULAR | Status: DC
  Administered 2014-11-27 – 2014-11-30 (×7): 3 mL via INTRAVENOUS

## 2014-11-27 MED ORDER — ASPIRIN 325 MG PO TABS
325.0000 mg | ORAL_TABLET | ORAL | Status: AC
  Administered 2014-11-27: 325 mg via ORAL
  Filled 2014-11-27: qty 1

## 2014-11-27 MED ORDER — VITAMIN B-6 100 MG PO TABS
100.0000 mg | ORAL_TABLET | Freq: Every day | ORAL | Status: DC
  Administered 2014-11-27 – 2014-11-30 (×4): 100 mg via ORAL
  Filled 2014-11-27 (×5): qty 1

## 2014-11-27 MED ORDER — ACETAMINOPHEN 650 MG RE SUPP: 650.0000 mg | Freq: Four times a day (QID) | RECTAL | Status: DC | PRN

## 2014-11-27 MED ORDER — SENNA 8.6 MG PO TABS
2.0000 | ORAL_TABLET | Freq: Every day | ORAL | Status: DC
  Administered 2014-11-27 – 2014-11-30 (×3): 17.2 mg via ORAL
  Filled 2014-11-27 (×5): qty 2

## 2014-11-27 MED ORDER — ACETAMINOPHEN 325 MG PO TABS
650.0000 mg | ORAL_TABLET | Freq: Four times a day (QID) | ORAL | Status: DC | PRN
  Administered 2014-11-28 (×2): 650 mg via ORAL
  Filled 2014-11-27 (×2): qty 2

## 2014-11-27 MED ORDER — ONDANSETRON HCL 4 MG PO TABS: 4.0000 mg | ORAL_TABLET | Freq: Four times a day (QID) | ORAL | Status: DC | PRN

## 2014-11-27 MED ORDER — HEPARIN SODIUM (PORCINE) 5000 UNIT/ML IJ SOLN: 60.0000 [IU]/kg | Freq: Once | INTRAMUSCULAR | Status: DC

## 2014-11-27 MED ORDER — CALCIUM CARBONATE-VITAMIN D 500-200 MG-UNIT PO TABS
2.0000 | ORAL_TABLET | Freq: Every day | ORAL | Status: DC
  Administered 2014-11-27 – 2014-11-30 (×4): 2 via ORAL
  Filled 2014-11-27 (×5): qty 2

## 2014-11-27 MED ORDER — ONDANSETRON HCL 4 MG/2ML IJ SOLN
4.0000 mg | Freq: Four times a day (QID) | INTRAMUSCULAR | Status: DC | PRN
  Administered 2014-11-30: 4 mg via INTRAVENOUS

## 2014-11-27 MED ORDER — HEPARIN BOLUS VIA INFUSION
2500.0000 [IU] | Freq: Once | INTRAVENOUS | Status: AC
  Administered 2014-11-27: 2500 [IU] via INTRAVENOUS
  Filled 2014-11-27: qty 2500

## 2014-11-27 MED ORDER — HEPARIN (PORCINE) IN NACL 100-0.45 UNIT/ML-% IJ SOLN
1100.0000 [IU]/h | INTRAMUSCULAR | Status: AC
  Administered 2014-11-27: 900 [IU]/h via INTRAVENOUS
  Filled 2014-11-27 (×2): qty 250

## 2014-11-27 MED ORDER — LACTULOSE 10 GM/15ML PO SOLN
10.0000 g | Freq: Once | ORAL | Status: AC
  Administered 2014-11-27: 10 g via ORAL
  Filled 2014-11-27: qty 15

## 2014-11-27 MED ORDER — IOHEXOL 350 MG/ML SOLN
100.0000 mL | Freq: Once | INTRAVENOUS | Status: AC | PRN
  Administered 2014-11-27: 100 mL via INTRAVENOUS

## 2014-11-27 MED ORDER — AMLODIPINE BESYLATE 5 MG PO TABS
5.0000 mg | ORAL_TABLET | Freq: Every day | ORAL | Status: DC
  Administered 2014-11-27 – 2014-11-30 (×4): 5 mg via ORAL
  Filled 2014-11-27 (×5): qty 1

## 2014-11-27 NOTE — ED Provider Notes (Signed)
CSN: [id number]     Arrival date & time 11/27/14  1234 History   First MD Initiated Contact with Patient 11/27/14 1323     Chief Complaint  Patient presents with  . Chest Pain  . Nausea     (Consider location/radiation/quality/duration/timing/severity/associated sxs/prior Treatment) HPI Comments: Patient is a 81 year old female with a past medical history of complete heart block with pacemaker in place, hypertension, polycythemia vera, and previous stroke who presents from her PCP office for further evaluation of left shoulder pain that has been persistent for the past 5 days. The pain is aching and moderate to severe with radiation into her left chest and left upper back. Deep inspiration makes the pain worse. No alleviating factors. She reports associated SOB. Left shoulder movement does not aggravate the pain, which her PCP found concerning and sent her to the ED for further evaluation. Patient also complains of abdominal pain and constipation for the past week. She has not had a normal bowel movement. No other associated symptoms.    Past Medical History  Diagnosis Date  . Complete heart block   . Hypercholesterolemia   . Polycythemia vera(238.4)   . Syncope   . Hypertension   . Stroke    Past Surgical History  Procedure Laterality Date  . Pacemaker insertion  03/26/09    MDT Adapta DR, revised for RV lead fracture by Dr Doreatha Lew  . Hand surgery      S/P Carpal tunnel repair  . Appendectomy    . Transthoracic echocardiogram  03/26/2009  . US echocardiography  03/01/2009    EF 55-60%   Family History  Problem Relation Age of Onset  . Adopted: Yes  . Migraines Son   . Heart disease Son     CABGx5  . Stroke Son 75  . Canavan disease Son     prostate  . Pulmonary embolism Daughter     x2 (age 76 & 48)  . Deep vein thrombosis Daughter   . Asthma Daughter   . Migraines Daughter   . Depression Daughter   . Other Daughter     Acid reflux   History  Substance Use Topics   . Smoking status: Never Smoker   . Smokeless tobacco: Never Used     Comment: never smoked  . Alcohol Use: No   OB History    No data available     Review of Systems  Constitutional: Negative for fever, chills and fatigue.  HENT: Negative for trouble swallowing.   Eyes: Negative for visual disturbance.  Respiratory: Negative for shortness of breath.   Cardiovascular: Positive for chest pain. Negative for palpitations.  Gastrointestinal: Positive for nausea, abdominal pain and constipation. Negative for vomiting and diarrhea.  Genitourinary: Negative for dysuria and difficulty urinating.  Musculoskeletal: Positive for arthralgias. Negative for neck pain.  Skin: Negative for color change.  Neurological: Negative for dizziness and weakness.  Psychiatric/Behavioral: Negative for dysphoric mood.      Allergies  Pollen extract  Home Medications   Prior to Admission medications   Medication Sig Start Date End Date Taking? Authorizing Provider  amLODipine (NORVASC) 5 MG tablet Take 5 mg by mouth daily.      Historical Provider, MD  aspirin 325 MG tablet Take 325 mg by mouth daily.     Historical Provider, MD  beta carotene w/minerals (OCUVITE) tablet Take 1 tablet by mouth daily.      Historical Provider, MD  calcium-vitamin D (OSCAL WITH D) 500-200 MG-UNIT per  tablet Take 2 tablets by mouth daily.      Historical Provider, MD  hydroxyurea (HYDREA) 500 MG capsule TAKE 1 CAPSULE (500 MG TOTAL) BY MOUTH ONCE A WEEK. MAY TAKE WITH FOOD TO MINIMIZE GI SIDE EFFECTS. 08/18/14   Volanda Napoleon, MD  Naproxen Sodium (ALEVE) 220 MG CAPS Take 1 capsule by mouth 2 (two) times daily.     Historical Provider, MD  Polyethyl Glycol-Propyl Glycol (SYSTANE ULTRA OP) Apply 1 drop to eye 2 (two) times daily.     Historical Provider, MD  pyridOXINE (VITAMIN B-6) 100 MG tablet Take 100 mg by mouth daily.    Historical Provider, MD  simvastatin (ZOCOR) 10 MG tablet Take 10 mg by mouth daily.       Historical Provider, MD   BP 144/54 mmHg  Pulse 81  Temp(Src) 97.9 F (36.6 C)  Resp 25  SpO2 93% Physical Exam  Constitutional: She is oriented to person, place, and time. She appears well-developed and well-nourished. No distress.  HENT:  Head: Normocephalic and atraumatic.  Eyes: Conjunctivae and EOM are normal.  Neck: Normal range of motion.  Cardiovascular: Normal rate and regular rhythm.  Exam reveals no gallop and no friction rub.   No murmur heard. Pulmonary/Chest: Effort normal and breath sounds normal. She has no wheezes. She has no rales. She exhibits no tenderness.  Abdominal: Soft. She exhibits no distension. There is tenderness. There is no rebound.  Mild lower abdominal tenderness to palpation. No focal tenderness.   Musculoskeletal: Normal range of motion.  No tenderness to palpation of left shoulder or pain with ROM of left shoulder.   Neurological: She is alert and oriented to person, place, and time. Coordination normal.  Speech is goal-oriented. Moves limbs without ataxia.   Skin: Skin is warm and dry.  Psychiatric: She has a normal mood and affect. Her behavior is normal.  Nursing note and vitals reviewed.   ED Course  Procedures (including critical care time) Labs Review Labs Reviewed  CBC - Abnormal; Notable for the following:    WBC 26.3 (*)    RBC 7.35 (*)    HCT 47.3 (*)    MCV 64.4 (*)    MCH 19.5 (*)    RDW 24.5 (*)    All other components within normal limits  BASIC METABOLIC PANEL - Abnormal; Notable for the following:    Glucose, Bld 126 (*)    BUN 21 (*)    Creatinine, Ser 1.03 (*)    GFR calc non Af Amer 46 (*)    GFR calc Af Amer 54 (*)    All other components within normal limits  HEPARIN LEVEL (UNFRACTIONATED) - Abnormal; Notable for the following:    Heparin Unfractionated <0.10 (*)    All other components within normal limits  TROPONIN I - Abnormal; Notable for the following:    Troponin I 0.16 (*)    All other components  within normal limits  TROPONIN I  CBC  URINALYSIS, ROUTINE W REFLEX MICROSCOPIC (NOT AT [facility])  TROPONIN I  CBC WITH DIFFERENTIAL/PLATELET  HEPATIC FUNCTION PANEL  HEPATITIS C ANTIBODY  HEPATITIS B SURFACE ANTIGEN  HEPARIN LEVEL (UNFRACTIONATED)  I-STAT TROPOININ, ED    Imaging Review Dg Chest 2 View  11/27/2014   CLINICAL DATA:  Left arm pain into the chest  EXAM: CHEST  2 VIEW  COMPARISON:  12/08/2013  FINDINGS: Biventricular pacer from the right with duplicated right ventricular leads, stable from previous. Normal heart size and stable  aortic/ hilar contours.  Linear opacities at the right base, new from prior. There is no edema, consolidation, effusion, or pneumothorax.  Remote left-sided rib fractures.  Splenomegaly, medially displacing the stomach. This is known from abdominal radiograph 2 days ago  IMPRESSION: Mild atelectasis at the right base.  No pneumonia or edema.   Electronically Signed   By: Monte Fantasia M.D.   On: 11/27/2014 13:27   Ct Angio Chest Pe W/cm &/or Wo Cm  11/27/2014   CLINICAL DATA:  Left arm pain radiating into the chest. Constipation. Aching and pain with recent left flank pain  EXAM: CT ANGIOGRAPHY CHEST  CT ABDOMEN AND PELVIS WITH CONTRAST  TECHNIQUE: Multidetector CT imaging of the chest was performed using the standard protocol during bolus administration of intravenous contrast. Multiplanar CT image reconstructions and MIPs were obtained to evaluate the vascular anatomy. Multidetector CT imaging of the abdomen and pelvis was performed using the standard protocol during bolus administration of intravenous contrast.  CONTRAST:  119mL OMNIPAQUE IOHEXOL 350 MG/ML SOLN  COMPARISON:  Multiple exams, including 11/27/2014; 03/30/2009; 03/22/2013  FINDINGS: Despite efforts by the technologist and patient, motion artifact is present on today's exam and could not be eliminated. This reduces exam sensitivity and specificity.  CT CHEST FINDINGS  Mediastinum/Nodes: Bilateral  acute pulmonary embolus extending in the right pulmonary artery, right upper lobe pulmonary artery, right lower lobe pulmonary artery, segmental branches in the left upper lobe, and subsegmental branches in the left lower lobe. RV:LV ratio 0.78, within normal limits.  Coronary, aortic arch, and branch vessel atherosclerotic vascular disease. Moderate cardiomegaly.  Calcification of the aortic and mitral valves.  Lungs/Pleura: Trace left pleural effusion. Mild biapical pleural parenchymal scarring. Subsegmental atelectasis or scarring along both hemidiaphragms.  Musculoskeletal: Old healed left posterior rib fractures.  CT ABDOMEN PELVIS FINDINGS  Hepatobiliary: Bilobed 1.0 by 0.8 cm fluid density lesion anteriorly in segment 8 of the liver, similar to 2010. Other fluid density lesions are present in the liver including a 1.9 by 1.2 cm cyst in segment 3. Some of the lesions are technically too small to characterize although statistically likely to be cysts.  Pancreas: Dorsal pancreatic duct 2.5 mm, type 1 pancreas divisum.  Spleen: Splenomegaly observed, splenic volume 911 cc. Small accessory spleen.  Adrenals/Urinary Tract: Scattered Bosniak category 1 and category 2 cysts are present in the kidneys, some too small to characterize. AA 4.2 by 3.3 cm exophytic cystic lesion from the left kidney lower pole has faint calcification along its posterior margin, images 44-46 series 10. Some of the lesions are somewhat hyperdense, including a 1.0 cm lesion on image 40 of series 11 which is subject to volume averaging making it difficult to compare portal venous and delayed phase images, and accordingly a small renal mass is difficult to confidently exclude. Several other mildly hyperdense lesions are similarly probably complex cysts but technically nonspecific.  Stomach/Bowel: Prominent sigmoid colon diverticulosis with scattered diverticula in the remainder the colon. No dilated bowel. New there is some mild stranding along  the left paracolic gutter and splenic flexure but without definite findings of acute diverticulitis.  Vascular/Lymphatic: Aortoiliac atherosclerotic vascular disease.  Reproductive: Calcification posteriorly in the myometrium, likely a small fibroid, and only measuring 6 mm in diameter.  Other: No supplemental non-categorized findings.  Musculoskeletal: Chronic widening of the sacroiliac joints. Transitional L5 vertebra. Considerable facet arthropathy at multiple levels, particularly L4-5, with 6 mm anterolisthesis at L4-5 and resulting mild right foraminal impingement. Suspected central narrowing of the thecal  sac at L4-5 due to facet arthropathy and disc bulge.  Chondrocalcinosis of the pubic symphysis.  Review of the MIP images confirms the above findings.  IMPRESSION: 1. Bilateral acute pulmonary embolus, moderate clot burden, right ventricular to left ventricular ratio within normal limits. 2. Multiple bilateral renal cysts. 3. There also hyperdense renal lesions which are technically indeterminate on today' s CT scan for solid versus cystic lesions. Consider renal protocol MRI with and without contrast in the nonacute setting for further workup, to ensure that none of these represent renal tumors. 4. Considerable splenomegaly, cause uncertain. I do not see other adenopathy in the chest, abdomen, or pelvis. 5. Numerous other findings including atherosclerosis, cardiomegaly, calcified aortic and mitral valves, trace left pleural effusion, hepatic cysts, type 1 pancreas divisum, prominent diverticulosis, small calcified uterine fibroid, chronically widened sacroiliac joints, lumbar spondylosis and degenerative disc disease causing impingement, chondrocalcinosis of the pubic symphysis, and low-level stranding along the left paracolic gutter and splenic flexure but without definite findings of diverticulitis or other explaining cause. Critical Value/emergent results were called by telephone at the time of  interpretation on 11/27/2014 at 4:09 pm to Dr. Roxanne Mins , who verbally acknowledged these results.   Electronically Signed   By: Van Clines M.D.   On: 11/27/2014 16:26   Ct Abdomen Pelvis W Contrast  11/27/2014   CLINICAL DATA:  Left arm pain radiating into the chest. Constipation. Aching and pain with recent left flank pain  EXAM: CT ANGIOGRAPHY CHEST  CT ABDOMEN AND PELVIS WITH CONTRAST  TECHNIQUE: Multidetector CT imaging of the chest was performed using the standard protocol during bolus administration of intravenous contrast. Multiplanar CT image reconstructions and MIPs were obtained to evaluate the vascular anatomy. Multidetector CT imaging of the abdomen and pelvis was performed using the standard protocol during bolus administration of intravenous contrast.  CONTRAST:  117mL OMNIPAQUE IOHEXOL 350 MG/ML SOLN  COMPARISON:  Multiple exams, including 11/27/2014; 03/30/2009; 03/22/2013  FINDINGS: Despite efforts by the technologist and patient, motion artifact is present on today's exam and could not be eliminated. This reduces exam sensitivity and specificity.  CT CHEST FINDINGS  Mediastinum/Nodes: Bilateral acute pulmonary embolus extending in the right pulmonary artery, right upper lobe pulmonary artery, right lower lobe pulmonary artery, segmental branches in the left upper lobe, and subsegmental branches in the left lower lobe. RV:LV ratio 0.78, within normal limits.  Coronary, aortic arch, and branch vessel atherosclerotic vascular disease. Moderate cardiomegaly.  Calcification of the aortic and mitral valves.  Lungs/Pleura: Trace left pleural effusion. Mild biapical pleural parenchymal scarring. Subsegmental atelectasis or scarring along both hemidiaphragms.  Musculoskeletal: Old healed left posterior rib fractures.  CT ABDOMEN PELVIS FINDINGS  Hepatobiliary: Bilobed 1.0 by 0.8 cm fluid density lesion anteriorly in segment 8 of the liver, similar to 2010. Other fluid density lesions are present in  the liver including a 1.9 by 1.2 cm cyst in segment 3. Some of the lesions are technically too small to characterize although statistically likely to be cysts.  Pancreas: Dorsal pancreatic duct 2.5 mm, type 1 pancreas divisum.  Spleen: Splenomegaly observed, splenic volume 911 cc. Small accessory spleen.  Adrenals/Urinary Tract: Scattered Bosniak category 1 and category 2 cysts are present in the kidneys, some too small to characterize. AA 4.2 by 3.3 cm exophytic cystic lesion from the left kidney lower pole has faint calcification along its posterior margin, images 44-46 series 10. Some of the lesions are somewhat hyperdense, including a 1.0 cm lesion on image 40 of series 11  which is subject to volume averaging making it difficult to compare portal venous and delayed phase images, and accordingly a small renal mass is difficult to confidently exclude. Several other mildly hyperdense lesions are similarly probably complex cysts but technically nonspecific.  Stomach/Bowel: Prominent sigmoid colon diverticulosis with scattered diverticula in the remainder the colon. No dilated bowel. New there is some mild stranding along the left paracolic gutter and splenic flexure but without definite findings of acute diverticulitis.  Vascular/Lymphatic: Aortoiliac atherosclerotic vascular disease.  Reproductive: Calcification posteriorly in the myometrium, likely a small fibroid, and only measuring 6 mm in diameter.  Other: No supplemental non-categorized findings.  Musculoskeletal: Chronic widening of the sacroiliac joints. Transitional L5 vertebra. Considerable facet arthropathy at multiple levels, particularly L4-5, with 6 mm anterolisthesis at L4-5 and resulting mild right foraminal impingement. Suspected central narrowing of the thecal sac at L4-5 due to facet arthropathy and disc bulge.  Chondrocalcinosis of the pubic symphysis.  Review of the MIP images confirms the above findings.  IMPRESSION: 1. Bilateral acute  pulmonary embolus, moderate clot burden, right ventricular to left ventricular ratio within normal limits. 2. Multiple bilateral renal cysts. 3. There also hyperdense renal lesions which are technically indeterminate on today' s CT scan for solid versus cystic lesions. Consider renal protocol MRI with and without contrast in the nonacute setting for further workup, to ensure that none of these represent renal tumors. 4. Considerable splenomegaly, cause uncertain. I do not see other adenopathy in the chest, abdomen, or pelvis. 5. Numerous other findings including atherosclerosis, cardiomegaly, calcified aortic and mitral valves, trace left pleural effusion, hepatic cysts, type 1 pancreas divisum, prominent diverticulosis, small calcified uterine fibroid, chronically widened sacroiliac joints, lumbar spondylosis and degenerative disc disease causing impingement, chondrocalcinosis of the pubic symphysis, and low-level stranding along the left paracolic gutter and splenic flexure but without definite findings of diverticulitis or other explaining cause. Critical Value/emergent results were called by telephone at the time of interpretation on 11/27/2014 at 7:86 pm to Dr. Roxanne Mins , who verbally acknowledged these results.   Electronically Signed   By: Van Clines M.D.   On: 11/27/2014 16:26   Dg Shoulder Left  11/27/2014   CLINICAL DATA:  Persistent chronic left shoulder pain for 1 month. Initial encounter.  EXAM: LEFT SHOULDER - 2+ VIEW  COMPARISON:  None.  FINDINGS: There is no evidence of fracture or dislocation. The left humeral head is seated within the glenoid fossa. The acromioclavicular joint is unremarkable in appearance. No significant soft tissue abnormalities are seen. The visualized portions of the left lung are clear.  IMPRESSION: No evidence of fracture or dislocation.   Electronically Signed   By: Garald Balding M.D.   On: 11/27/2014 20:41     EKG Interpretation None      MDM   Final  diagnoses:  Pulmonary embolism    2:03 PM Labs unremarkable for acute changes. Chest xray shows atelectasis at the right lung base. Patient will have CT angio chest and CT abdomen and pelvis. Vitals stable and patient afebrile.   Patient signed out to [facility], PA-C.     Alvina Chou, PA-C 11/28/14 [id number]  Davonna Belling, MD 11/30/14 [phone]

## 2014-11-27 NOTE — ED Notes (Signed)
Pt here for left arm pain radiating into chest. sts was seen a few days ago and dx with constipation at [facility]. sts she still hasn't had normal BM. sts achy and the pain causes her to be SOB.

## 2014-11-27 NOTE — ED Notes (Signed)
Dr Tat at bedside 

## 2014-11-27 NOTE — Progress Notes (Signed)
ANTICOAGULATION CONSULT NOTE - Initial Consult  Pharmacy Consult for heparin Indication: pulmonary embolus  Allergies  Allergen Reactions  . Chocolate Other (See Comments)    migraine  . Pollen Extract     sneezing    Patient Measurements:   Heparin Dosing Weight: 57.5 kg  Vital Signs: Temp: 97.9 F (36.6 C) (06/13 1245) BP: 145/60 mmHg (06/13 1618) Pulse Rate: 81 (06/13 1618)  Labs:  Recent Labs  11/27/14 1251  HGB 14.3  HCT 47.3*  PLT 176  CREATININE 1.03*    CrCl cannot be calculated (Unknown ideal weight.).   Medical History: Past Medical History  Diagnosis Date  . Complete heart block   . Hypercholesterolemia   . Polycythemia vera(238.4)   . Syncope   . Hypertension   . Stroke     Medications:   (Not in a hospital admission)  Assessment: 81 yo female admitted with shoulder pain and abdominal pain. CTA showed bilateral PE with moderate clot burden. RV/LV ratio wnl. Pt does not take any anticoagulants PTA, h/h and plts wnl.  Goal of Therapy:  Heparin level 0.3-0.7 units/ml Monitor platelets by anticoagulation protocol: Yes   Plan:  Heparin 2500 units x1 then 900 units/hr Daily HL, CBC Check HL this evening  Monitor s/sx bleeding  Hughes Better, PharmD, BCPS Clinical Pharmacist Pager: [phone] 11/27/2014 4:56 PM

## 2014-11-27 NOTE — Progress Notes (Signed)
History and Physical  [name] VQQ:[id number] DOB: September 08, 1922 DOA: 11/27/2014   PCP: Vidal Schwalbe, MD  Referring Physician: ED/ Dr. Roxanne Mins  Chief Complaint: shoulder pain/abdominal pain  HPI:  81 year old female with a history of hypertension, polycythemia rubra vera, complete heart block status post PPM, , stroke presented with a two-month history of dyspnea on exertion that has worsened significantly in the past 2 weeks. In addition, she also has had left upper quadrant, left lower quadrant, and left shoulder pain for the past 2 weeks. Because of her worsening pain, the patient went to urgent care on 11/24/2014.  She was told to follow up with her primary care provider after x-rays were unremarkable. She went to see her primary care provider on the day of admission, and she was told to come to the emergency department for further evaluation. Notably, the patient has been taking Aleve, one tablet daily for the past 2 years. Workup in the emergency department revealed bilateral pulmonary embolus seen on CT angiogram of the chest. The patient denies any recent surgery, trauma, or long trips with immobility. In fact, the patient normally walks one to 2 miles daily, but she quit doing so one week ago because of increasing dyspnea on exertion. She denies any lower extremity edema, orthopnea, PND. Regarding her abdominal pain, the patient states that this has been present for the past 2 weeks. She had a very small bowel movement on 11/26/2014, prior to that, she has not had a bowel movement for over one week. She denies any dysuria, hematochezia, melena, hematuria. CT of the abdomen and pelvis in the emergency department revealed diverticulosis without frank diverticulitis; there were also indeterminate hyperdense renal lesions. There was splenomegaly and pancreatic divisum.    The patient was afebrile and hemodynamically stable with oxygen saturation 98% on room air. WBC showed 26.3. BMP was  unremarkable.  Assessment/Plan: Acute bilateral pulmonary emboli  -Certainly, the patient's history of polycythemia rubra vera has placed her an increased risk for thromboembolic events  -Start heparin drip  -Echocardiogram  -Telemetry  -Daughter has history of pulmonary emboli 2 separate occasions, but the patient herself is adopted  -Discussed the risks, benefits, and alternatives of warfarin versus  Factor Xa inhibitors Atypical chest pain  -partly reproducible  -Likely due to the patient's pulmonary embolus  -Cycle troponins  -EKG is paced, unchanged  left shoulder pain -X-ray left shoulder -Suspect referred pain from the patient's pulmonary embolus Abdominal pain -Suspect constipation -Started cathartics -CT abdomen and pelvis negative for any findings to explain the pain although her splenomegaly may be partly contributing Leukocytosis -The patient has had a chronic leukocytosis dating back to January 2015 -She is afebrile and hemodynamically stable -Will not start any antibiotics -UA Splenomegaly -Check hepatitis B surface antigen, hepatitis C antibody -Check LFTs Polycythemia rubra vera -Continue Hydrea once weekly Complete heart block -Patient is status post permanent pacemaker Iron deficiency anemia -Secondary to the patient's chronic phlebotomy Hyperlipidemia -Continue Zocor        Past Medical History  Diagnosis Date  . Complete heart block   . Hypercholesterolemia   . Polycythemia vera(238.4)   . Syncope   . Hypertension   . Stroke    Past Surgical History  Procedure Laterality Date  . Pacemaker insertion  03/26/09    MDT Adapta DR, revised for RV lead fracture by Dr Doreatha Lew  . Hand surgery      S/P Carpal tunnel repair  . Appendectomy    .  Transthoracic echocardiogram  03/26/2009  . US echocardiography  03/01/2009    EF 55-60%   Social History:  reports that she has never smoked. She has never used smokeless tobacco. She reports that she  does not drink alcohol or use illicit drugs.   Family History  Problem Relation Age of Onset  . Adopted: Yes  . Migraines Son   . Heart disease Son     CABGx5  . Stroke Son 45  . Canavan disease Son     prostate  . Pulmonary embolism Daughter     x2 (age 73 & 79)  . Deep vein thrombosis Daughter   . Asthma Daughter   . Migraines Daughter   . Depression Daughter   . Other Daughter     Acid reflux     Allergies  Allergen Reactions  . Chocolate Other (See Comments)    migraine  . Pollen Extract     sneezing      Prior to Admission medications   Medication Sig Start Date End Date Taking? Authorizing Provider  amLODipine (NORVASC) 5 MG tablet Take 5 mg by mouth daily.     Yes Historical Provider, MD  aspirin 325 MG tablet Take 325 mg by mouth daily.    Yes Historical Provider, MD  aspirin 81 MG chewable tablet Chew 81 mg by mouth daily.   Yes Historical Provider, MD  beta carotene w/minerals (OCUVITE) tablet Take 1 tablet by mouth daily.     Yes Historical Provider, MD  calcium-vitamin D (OSCAL WITH D) 500-200 MG-UNIT per tablet Take 2 tablets by mouth daily.     Yes Historical Provider, MD  hydroxyurea (HYDREA) 500 MG capsule TAKE 1 CAPSULE (500 MG TOTAL) BY MOUTH ONCE A WEEK. MAY TAKE WITH FOOD TO MINIMIZE GI SIDE EFFECTS. 08/18/14  Yes Volanda Napoleon, MD  Naproxen Sodium (ALEVE) 220 MG CAPS Take 1 capsule by mouth 2 (two) times daily.    Yes Historical Provider, MD  Polyethyl Glycol-Propyl Glycol (SYSTANE ULTRA OP) Apply 1 drop to eye 2 (two) times daily.    Yes Historical Provider, MD  pyridOXINE (VITAMIN B-6) 100 MG tablet Take 100 mg by mouth daily.   Yes Historical Provider, MD  simvastatin (ZOCOR) 10 MG tablet Take 10 mg by mouth daily.     Yes Historical Provider, MD    Review of Systems:  Constitutional:  No weight loss, night sweats, Fevers, chills Head&Eyes: No headache.  No vision loss.  No eye pain or scotoma ENT:  No Difficulty swallowing,Tooth/dental  problems,Sore throat,  No ear ache, post nasal drip,  Cardio-vascular:  No Orthopnea, PND, swelling in lower extremities,  dizziness, palpitations  GI:  No nausea, vomiting, diarrhea, loss of appetite, hematochezia, melena, heartburn, indigestion, Resp:  No cough. No coughing up of blood .No wheezing.No chest wall deformity  Skin:  no rash or lesions.  GU:  no dysuria, change in color of urine, no urgency or frequency. No flank pain.  Musculoskeletal:  Left shoulder pain, no synovitis Psych:  No change in mood or affect. No depression or anxiety. Neurologic: No headache, no dysesthesia, no focal weakness, no vision loss. No syncope  Physical Exam: Filed Vitals:   11/27/14 1630 11/27/14 1645 11/27/14 1700 11/27/14 1715  BP: 150/65 155/66 134/67 145/68  Pulse: 88 83 82 84  Temp:      Resp: 22 22 19 26   SpO2: 95% 94% 93% 94%   General:  A&O x 3, NAD, nontoxic, pleasant/cooperative Head/Eye: No conjunctival  hemorrhage, no icterus, Miner/AT, No nystagmus ENT:  No icterus,  No thrush, good dentition, no pharyngeal exudate Neck:  No masses, no lymphadenpathy, no bruits CV:  RRR, no rub, no gallop, no S3 Lung:  Bibasilar crackles, left greater than right. No wheezing. Good air movement.  Abdomen: soft/NT, +BS, nondistended, no peritoneal signs Ext: No cyanosis, No rashes, No petechiae, No lymphangitis, No edema Neuro: CNII-XII intact, strength 4/5 in bilateral upper and lower extremities, no dysmetria  Labs on Admission:  Basic Metabolic Panel:  Recent Labs Lab 11/27/14 1251  NA 140  K 4.3  CL 104  CO2 27  GLUCOSE 126*  BUN 21*  CREATININE 1.03*  CALCIUM 9.6   Liver Function Tests: No results for input(s): AST, ALT, ALKPHOS, BILITOT, PROT, ALBUMIN in the last 168 hours. No results for input(s): LIPASE, AMYLASE in the last 168 hours. No results for input(s): AMMONIA in the last 168 hours. CBC:  Recent Labs Lab 11/27/14 1251  WBC 26.3*  HGB 14.3  HCT 47.3*  MCV  64.4*  PLT 176   Cardiac Enzymes: No results for input(s): CKTOTAL, CKMB, CKMBINDEX, TROPONINI in the last 168 hours. BNP: Invalid input(s): POCBNP CBG: No results for input(s): GLUCAP in the last 168 hours.  Radiological Exams on Admission: Dg Chest 2 View  11/27/2014   CLINICAL DATA:  Left arm pain into the chest  EXAM: CHEST  2 VIEW  COMPARISON:  12/08/2013  FINDINGS: Biventricular pacer from the right with duplicated right ventricular leads, stable from previous. Normal heart size and stable aortic/ hilar contours.  Linear opacities at the right base, new from prior. There is no edema, consolidation, effusion, or pneumothorax.  Remote left-sided rib fractures.  Splenomegaly, medially displacing the stomach. This is known from abdominal radiograph 2 days ago  IMPRESSION: Mild atelectasis at the right base.  No pneumonia or edema.   Electronically Signed   By: Monte Fantasia M.D.   On: 11/27/2014 13:27   Ct Angio Chest Pe W/cm &/or Wo Cm  11/27/2014   CLINICAL DATA:  Left arm pain radiating into the chest. Constipation. Aching and pain with recent left flank pain  EXAM: CT ANGIOGRAPHY CHEST  CT ABDOMEN AND PELVIS WITH CONTRAST  TECHNIQUE: Multidetector CT imaging of the chest was performed using the standard protocol during bolus administration of intravenous contrast. Multiplanar CT image reconstructions and MIPs were obtained to evaluate the vascular anatomy. Multidetector CT imaging of the abdomen and pelvis was performed using the standard protocol during bolus administration of intravenous contrast.  CONTRAST:  134mL OMNIPAQUE IOHEXOL 350 MG/ML SOLN  COMPARISON:  Multiple exams, including 11/27/2014; 03/30/2009; 03/22/2013  FINDINGS: Despite efforts by the technologist and patient, motion artifact is present on today's exam and could not be eliminated. This reduces exam sensitivity and specificity.  CT CHEST FINDINGS  Mediastinum/Nodes: Bilateral acute pulmonary embolus extending in the right  pulmonary artery, right upper lobe pulmonary artery, right lower lobe pulmonary artery, segmental branches in the left upper lobe, and subsegmental branches in the left lower lobe. RV:LV ratio 0.78, within normal limits.  Coronary, aortic arch, and branch vessel atherosclerotic vascular disease. Moderate cardiomegaly.  Calcification of the aortic and mitral valves.  Lungs/Pleura: Trace left pleural effusion. Mild biapical pleural parenchymal scarring. Subsegmental atelectasis or scarring along both hemidiaphragms.  Musculoskeletal: Old healed left posterior rib fractures.  CT ABDOMEN PELVIS FINDINGS  Hepatobiliary: Bilobed 1.0 by 0.8 cm fluid density lesion anteriorly in segment 8 of the liver, similar to 2010. Other fluid  density lesions are present in the liver including a 1.9 by 1.2 cm cyst in segment 3. Some of the lesions are technically too small to characterize although statistically likely to be cysts.  Pancreas: Dorsal pancreatic duct 2.5 mm, type 1 pancreas divisum.  Spleen: Splenomegaly observed, splenic volume 911 cc. Small accessory spleen.  Adrenals/Urinary Tract: Scattered Bosniak category 1 and category 2 cysts are present in the kidneys, some too small to characterize. AA 4.2 by 3.3 cm exophytic cystic lesion from the left kidney lower pole has faint calcification along its posterior margin, images 44-46 series 10. Some of the lesions are somewhat hyperdense, including a 1.0 cm lesion on image 40 of series 11 which is subject to volume averaging making it difficult to compare portal venous and delayed phase images, and accordingly a small renal mass is difficult to confidently exclude. Several other mildly hyperdense lesions are similarly probably complex cysts but technically nonspecific.  Stomach/Bowel: Prominent sigmoid colon diverticulosis with scattered diverticula in the remainder the colon. No dilated bowel. New there is some mild stranding along the left paracolic gutter and splenic flexure  but without definite findings of acute diverticulitis.  Vascular/Lymphatic: Aortoiliac atherosclerotic vascular disease.  Reproductive: Calcification posteriorly in the myometrium, likely a small fibroid, and only measuring 6 mm in diameter.  Other: No supplemental non-categorized findings.  Musculoskeletal: Chronic widening of the sacroiliac joints. Transitional L5 vertebra. Considerable facet arthropathy at multiple levels, particularly L4-5, with 6 mm anterolisthesis at L4-5 and resulting mild right foraminal impingement. Suspected central narrowing of the thecal sac at L4-5 due to facet arthropathy and disc bulge.  Chondrocalcinosis of the pubic symphysis.  Review of the MIP images confirms the above findings.  IMPRESSION: 1. Bilateral acute pulmonary embolus, moderate clot burden, right ventricular to left ventricular ratio within normal limits. 2. Multiple bilateral renal cysts. 3. There also hyperdense renal lesions which are technically indeterminate on today' s CT scan for solid versus cystic lesions. Consider renal protocol MRI with and without contrast in the nonacute setting for further workup, to ensure that none of these represent renal tumors. 4. Considerable splenomegaly, cause uncertain. I do not see other adenopathy in the chest, abdomen, or pelvis. 5. Numerous other findings including atherosclerosis, cardiomegaly, calcified aortic and mitral valves, trace left pleural effusion, hepatic cysts, type 1 pancreas divisum, prominent diverticulosis, small calcified uterine fibroid, chronically widened sacroiliac joints, lumbar spondylosis and degenerative disc disease causing impingement, chondrocalcinosis of the pubic symphysis, and low-level stranding along the left paracolic gutter and splenic flexure but without definite findings of diverticulitis or other explaining cause. Critical Value/emergent results were called by telephone at the time of interpretation on 11/27/2014 at 7:78 pm to Dr. Roxanne Mins ,  who verbally acknowledged these results.   Electronically Signed   By: Van Clines M.D.   On: 11/27/2014 16:26   Ct Abdomen Pelvis W Contrast  11/27/2014   CLINICAL DATA:  Left arm pain radiating into the chest. Constipation. Aching and pain with recent left flank pain  EXAM: CT ANGIOGRAPHY CHEST  CT ABDOMEN AND PELVIS WITH CONTRAST  TECHNIQUE: Multidetector CT imaging of the chest was performed using the standard protocol during bolus administration of intravenous contrast. Multiplanar CT image reconstructions and MIPs were obtained to evaluate the vascular anatomy. Multidetector CT imaging of the abdomen and pelvis was performed using the standard protocol during bolus administration of intravenous contrast.  CONTRAST:  120mL OMNIPAQUE IOHEXOL 350 MG/ML SOLN  COMPARISON:  Multiple exams, including 11/27/2014; 03/30/2009; 03/22/2013  FINDINGS: Despite efforts by the technologist and patient, motion artifact is present on today's exam and could not be eliminated. This reduces exam sensitivity and specificity.  CT CHEST FINDINGS  Mediastinum/Nodes: Bilateral acute pulmonary embolus extending in the right pulmonary artery, right upper lobe pulmonary artery, right lower lobe pulmonary artery, segmental branches in the left upper lobe, and subsegmental branches in the left lower lobe. RV:LV ratio 0.78, within normal limits.  Coronary, aortic arch, and branch vessel atherosclerotic vascular disease. Moderate cardiomegaly.  Calcification of the aortic and mitral valves.  Lungs/Pleura: Trace left pleural effusion. Mild biapical pleural parenchymal scarring. Subsegmental atelectasis or scarring along both hemidiaphragms.  Musculoskeletal: Old healed left posterior rib fractures.  CT ABDOMEN PELVIS FINDINGS  Hepatobiliary: Bilobed 1.0 by 0.8 cm fluid density lesion anteriorly in segment 8 of the liver, similar to 2010. Other fluid density lesions are present in the liver including a 1.9 by 1.2 cm cyst in segment 3.  Some of the lesions are technically too small to characterize although statistically likely to be cysts.  Pancreas: Dorsal pancreatic duct 2.5 mm, type 1 pancreas divisum.  Spleen: Splenomegaly observed, splenic volume 911 cc. Small accessory spleen.  Adrenals/Urinary Tract: Scattered Bosniak category 1 and category 2 cysts are present in the kidneys, some too small to characterize. AA 4.2 by 3.3 cm exophytic cystic lesion from the left kidney lower pole has faint calcification along its posterior margin, images 44-46 series 10. Some of the lesions are somewhat hyperdense, including a 1.0 cm lesion on image 40 of series 11 which is subject to volume averaging making it difficult to compare portal venous and delayed phase images, and accordingly a small renal mass is difficult to confidently exclude. Several other mildly hyperdense lesions are similarly probably complex cysts but technically nonspecific.  Stomach/Bowel: Prominent sigmoid colon diverticulosis with scattered diverticula in the remainder the colon. No dilated bowel. New there is some mild stranding along the left paracolic gutter and splenic flexure but without definite findings of acute diverticulitis.  Vascular/Lymphatic: Aortoiliac atherosclerotic vascular disease.  Reproductive: Calcification posteriorly in the myometrium, likely a small fibroid, and only measuring 6 mm in diameter.  Other: No supplemental non-categorized findings.  Musculoskeletal: Chronic widening of the sacroiliac joints. Transitional L5 vertebra. Considerable facet arthropathy at multiple levels, particularly L4-5, with 6 mm anterolisthesis at L4-5 and resulting mild right foraminal impingement. Suspected central narrowing of the thecal sac at L4-5 due to facet arthropathy and disc bulge.  Chondrocalcinosis of the pubic symphysis.  Review of the MIP images confirms the above findings.  IMPRESSION: 1. Bilateral acute pulmonary embolus, moderate clot burden, right ventricular to  left ventricular ratio within normal limits. 2. Multiple bilateral renal cysts. 3. There also hyperdense renal lesions which are technically indeterminate on today' s CT scan for solid versus cystic lesions. Consider renal protocol MRI with and without contrast in the nonacute setting for further workup, to ensure that none of these represent renal tumors. 4. Considerable splenomegaly, cause uncertain. I do not see other adenopathy in the chest, abdomen, or pelvis. 5. Numerous other findings including atherosclerosis, cardiomegaly, calcified aortic and mitral valves, trace left pleural effusion, hepatic cysts, type 1 pancreas divisum, prominent diverticulosis, small calcified uterine fibroid, chronically widened sacroiliac joints, lumbar spondylosis and degenerative disc disease causing impingement, chondrocalcinosis of the pubic symphysis, and low-level stranding along the left paracolic gutter and splenic flexure but without definite findings of diverticulitis or other explaining cause. Critical Value/emergent results were called by telephone at the time  of interpretation on 11/27/2014 at 1:43 pm to Dr. Roxanne Mins , who verbally acknowledged these results.   Electronically Signed   By: Van Clines M.D.   On: 11/27/2014 16:26    EKG: Independently reviewed. Paced rhythm    Time spent:70 minutes Code Status:   FULL Family Communication:   Son and daughter at bedside   Vanassa Penniman, DO  [facility] Pager [phone]  If 7PM-7AM, please contact night-coverage [email or web address] Password [facility] 11/27/2014, 5:49 PM

## 2014-11-27 NOTE — ED Provider Notes (Signed)
  Physical Exam  BP 154/66 mmHg  Pulse 80  Temp(Src) 97.9 F (36.6 C)  Resp 27  SpO2 95%  Physical Exam General: She is well-appearing and well-developed. No acute distress. Cardiovascular: Normal rate and intact pulses. Pulmonary: Normal effort and normal breath sounds. No respiratory distress. She has no wheezes, rales. Abdomen: Generalized mild abdominal pain. ED Course  Procedures Patient was given 324 mg of aspirin prior to me evaluating the patient. Patient was signed out to me by Alvina Chou, PA-C. She is in no acute distress at this time. She is 94% oxygen on room air. She has been complaining of left shoulder pain x 4 days and vague abdominal pain. Worse with inspiration. She is currently on 81 mg of aspirin. CTA shows bilateral PEs, with moderate clot burden. I ordered heparin. Filed Vitals:   11/27/14 1618  BP: 145/60  Pulse: 81  Temp:   Resp: 16   Dr. Roxanne Mins spoke to Dr. Shanon Brow Tat regarding the patient's bilateral PEs and need for a telemetry bed due to age and need for anticoagulation.   Ottie Glazier, PA-C 76/18/48 [id number]  Delora Fuel, MD 63/94/32 [id number]

## 2014-11-28 ENCOUNTER — Encounter (HOSPITAL_COMMUNITY): Payer: Self-pay | Admitting: Cardiology

## 2014-11-28 ENCOUNTER — Inpatient Hospital Stay (HOSPITAL_BASED_OUTPATIENT_CLINIC_OR_DEPARTMENT_OTHER): Payer: Medicare Other

## 2014-11-28 DIAGNOSIS — I4891 Unspecified atrial fibrillation: Secondary | ICD-10-CM

## 2014-11-28 LAB — CBC WITH DIFFERENTIAL/PLATELET
BASOS ABS: 0.2 10*3/uL — AB (ref 0.0–0.1)
Basophils Relative: 1 % (ref 0–1)
Eosinophils Absolute: 1 10*3/uL — ABNORMAL HIGH (ref 0.0–0.7)
Eosinophils Relative: 4 % (ref 0–5)
HCT: 45.1 % (ref 36.0–46.0)
Hemoglobin: 13.6 g/dL (ref 12.0–15.0)
LYMPHS ABS: 1.5 10*3/uL (ref 0.7–4.0)
Lymphocytes Relative: 6 % — ABNORMAL LOW (ref 12–46)
MCH: 19.3 pg — AB (ref 26.0–34.0)
MCHC: 30.2 g/dL (ref 30.0–36.0)
MCV: 64.1 fL — ABNORMAL LOW (ref 78.0–100.0)
MONOS PCT: 9 % (ref 3–12)
Monocytes Absolute: 2.2 10*3/uL — ABNORMAL HIGH (ref 0.1–1.0)
NEUTROS ABS: 19.4 10*3/uL — AB (ref 1.7–7.7)
Neutrophils Relative %: 80 % — ABNORMAL HIGH (ref 43–77)
Platelets: 190 10*3/uL (ref 150–400)
RBC: 7.04 MIL/uL — ABNORMAL HIGH (ref 3.87–5.11)
RDW: 24.4 % — AB (ref 11.5–15.5)
WBC: 24.3 10*3/uL — ABNORMAL HIGH (ref 4.0–10.5)

## 2014-11-28 LAB — HEPATIC FUNCTION PANEL
ALBUMIN: 3.2 g/dL — AB (ref 3.5–5.0)
ALT: 18 U/L (ref 14–54)
AST: 31 U/L (ref 15–41)
Alkaline Phosphatase: 87 U/L (ref 38–126)
BILIRUBIN DIRECT: 0.2 mg/dL (ref 0.1–0.5)
BILIRUBIN TOTAL: 1.1 mg/dL (ref 0.3–1.2)
Indirect Bilirubin: 0.9 mg/dL (ref 0.3–0.9)
Total Protein: 6 g/dL — ABNORMAL LOW (ref 6.5–8.1)

## 2014-11-28 LAB — TROPONIN I
TROPONIN I: 0.16 ng/mL — AB (ref <0.031)
Troponin I: <0.03 ng/mL (ref <0.031)

## 2014-11-28 LAB — HEPARIN LEVEL (UNFRACTIONATED): Heparin Unfractionated: <0.1 IU/mL — ABNORMAL LOW (ref 0.30–0.70)

## 2014-11-28 MED ORDER — APIXABAN 5 MG PO TABS: 5.0000 mg | ORAL_TABLET | Freq: Two times a day (BID) | ORAL | Status: DC

## 2014-11-28 MED ORDER — HEPARIN BOLUS VIA INFUSION
1500.0000 [IU] | Freq: Once | INTRAVENOUS | Status: AC
  Administered 2014-11-28: 1500 [IU] via INTRAVENOUS
  Filled 2014-11-28: qty 1500

## 2014-11-28 MED ORDER — APIXABAN 5 MG PO TABS
10.0000 mg | ORAL_TABLET | Freq: Two times a day (BID) | ORAL | Status: DC
Start: 2014-11-28 — End: 2014-11-29
  Administered 2014-11-28 – 2014-11-29 (×3): 10 mg via ORAL
  Filled 2014-11-28 (×4): qty 2

## 2014-11-28 NOTE — Progress Notes (Signed)
Follow-up:  Notified by RN regarding 2nd troponin elevated at 0.16. Pt denies CP. Is currently on heparin drip for PE. Ct shows bil acute PE w/ moderate clot burden and RV:LV ratio WNL. EKG shows paced rhythm. Pt scheduled for 2-D echo this am.  Assessment/Plan: 1. Elevated troponin: In setting of PE. Pt currently w/o CP. Likely associated w/ PE. Discussed pt w/ Dr Hal Hope.  Will continue to cycle enzymes and monitor closely on telemetry.   Jeryl Columbia, NP-C [facility] Pager [phone]

## 2014-11-28 NOTE — Care Management Note (Addendum)
Case Management Note  Patient Details  Name: [name] MRN: [id number] Date of Birth: [birth year]  Subjective/Objective:   Pt admitted with PE                 Action/Plan:  Pt is independent from home.  Pt will go home on Eliquis 10 mg BID per MD and pharmacy.  CM will assist with medication set up post discharge.  CM will continue to monitor for disposition plan.   Expected Discharge Date:                  Expected Discharge Plan:  [place]  In-House Referral:     Discharge planning Services  CM Consult, Medication Assistance  Post Acute Care Choice:    Choice offered to:     DME Arranged:    DME Agency:     HH Arranged:    HH Agency:     Status of Service:  In process, will continue to follow  Medicare Important Message Given:  Yes Date Medicare IM Given:  11/28/14 Medicare IM give by:  Elenor Quinones Date Additional Medicare IM Given:    Additional Medicare Important Message give by:     If discussed at [place] of Stay Meetings, dates discussed:    Additional Comments: CM submitted benefit check for Eliquis:  11/28/2014 [place] at [phone]. Talked to [place] (REF # [id number]). ELIQUIS is covered. No Prior Authorization required. Patient will have a retail pharmacy co-payment of [phone]. ELIQUIS available at several retail locations.    [facility].   CM provided free 30 day card to pt and instructed pt to provide card and prescription at preferred pharmacy [facility] [address] in [place], [place] contacted pharmacy and was informed that medication is available for filling.  CM informed pt that monthly copay would be $106.63, pt stated this may cause hardship.  CM informed pt that she could contact Eliquis manufacture directly and request assistance, pt stated she may. CM offered to dial Eliquis assistance number provided on pamphlet with patient actually talking to service; pt refused and stated she wanted to discuss the  cost of the medication with her children as they handle picking up and paying for prescriptions.  CM was informed that pts daughter may come visit during the evening, requested pt discuss with daughter or contact son regarding copay restraints and CM will follow up in this am.  Pharmacist made aware and informed MD.  CM placed sticky physician note on summary explaining concern.  CM assessed pt and was informed that pt lives at home independent without [facility] services and without [facility], pt son at bedside.  Maryclare Labrador, RN 11/28/2014, 2:23 PM

## 2014-11-28 NOTE — Progress Notes (Signed)
ANTICOAGULATION CONSULT NOTE - Follow Up Consult  Pharmacy Consult for Heparin  Indication: pulmonary embolus  Allergies  Allergen Reactions  . Chocolate Other (See Comments)    migraine  . Pollen Extract     sneezing    Patient Measurements: Height: 5\' 3"  (160 cm) Weight: 122 lb 6.4 oz (55.52 kg) IBW/kg (Calculated) : 52.4  Vital Signs: Temp: 98 F (36.7 C) (06/13 2050) Temp Source: Oral (06/13 2050) BP: 125/55 mmHg (06/13 2050) Pulse Rate: 79 (06/13 2050)  Labs:  Recent Labs  11/27/14 1251 11/27/14 1930 11/28/14 0102  HGB 14.3  --   --   HCT 47.3*  --   --   PLT 176  --   --   HEPARINUNFRC  --   --  <0.10*  CREATININE 1.03*  --   --   TROPONINI  --  <0.03 0.16*    Estimated Creatinine Clearance: 30 mL/min (by C-G formula based on Cr of 1.03).   Assessment: Heparin for PE, first HL is sub-therapeutic, infusing ok per RN.   Goal of Therapy:  Heparin level 0.3-0.7 units/ml Monitor platelets by anticoagulation protocol: Yes   Plan:  -Heparin 1500 units BOLUS -Increase heparin drip to 1100 units/hr -1100 HL -Daily CBC/HL -Monitor for bleeding  Narda Bonds 11/28/2014,2:17 AM

## 2014-11-28 NOTE — Progress Notes (Signed)
PA Schorr notified of pt troponin level - 0.16.  Pt asymptomatic, no new orders at this time.  Will continue to monitor pt closely.  Claudette Stapler, RN

## 2014-11-28 NOTE — Progress Notes (Signed)
Utilization review completed.  

## 2014-11-28 NOTE — Progress Notes (Signed)
  Echocardiogram 2D Echocardiogram has been performed.  Diamond Nickel 11/28/2014, 2:37 PM

## 2014-11-28 NOTE — Discharge Instructions (Addendum)
Information on my medicine - ELIQUIS (apixaban)  This medication education was reviewed with me or my healthcare representative as part of my discharge preparation.  The pharmacist that spoke with me during my hospital stay was:  Wynn Maudlin.D.  Why was Eliquis prescribed for you? Eliquis was prescribed to treat blood clots that may have been found in the veins of your legs (deep vein thrombosis) or in your lungs (pulmonary embolism) and to reduce the risk of them occurring again.  What do You need to know about Eliquis ? The starting dose is 10 mg (two 5 mg tablets) taken TWICE daily for the FIRST SEVEN (7) DAYS, then on  12/05/2014  the dose is reduced to ONE 5 mg tablet taken TWICE daily.  Eliquis may be taken with or without food.   Try to take the dose about the same time in the morning and in the evening. If you have difficulty swallowing the tablet whole please discuss with your pharmacist how to take the medication safely.  Take Eliquis exactly as prescribed and DO NOT stop taking Eliquis without talking to the doctor who prescribed the medication.  Stopping may increase your risk of developing a new blood clot.  Refill your prescription before you run out.  After discharge, you should have regular check-up appointments with your healthcare provider that is prescribing your Eliquis.    What do you do if you miss a dose? If a dose of ELIQUIS is not taken at the scheduled time, take it as soon as possible on the same day and twice-daily administration should be resumed. The dose should not be doubled to make up for a missed dose.  Important Safety Information A possible side effect of Eliquis is bleeding. You should call your healthcare provider right away if you experience any of the following: ? Bleeding from an injury or your nose that does not stop. ? Unusual colored urine (red or dark brown) or unusual colored stools (red or black). ? Unusual bruising for unknown  reasons. ? A serious fall or if you hit your head (even if there is no bleeding).  Some medicines may interact with Eliquis and might increase your risk of bleeding or clotting while on Eliquis. To help avoid this, consult your healthcare provider or pharmacist prior to using any new prescription or non-prescription medications, including herbals, vitamins, non-steroidal anti-inflammatory drugs (NSAIDs) and supplements.  This website has more information on Eliquis (apixaban): http://[email or web address].com/eliquis/home

## 2014-11-28 NOTE — Progress Notes (Signed)
ANTICOAGULATION CONSULT NOTE - Initial Consult  Pharmacy Consult for Eliquis Indication: pulmonary embolus  Allergies  Allergen Reactions  . Chocolate Other (See Comments)    migraine  . Pollen Extract     sneezing    Patient Measurements: Height: 5\' 3"  (160 cm) Weight: 122 lb 6.4 oz (55.52 kg) IBW/kg (Calculated) : 52.4   Vital Signs: Temp: 97.7 F (36.5 C) (06/14 0815) Temp Source: Oral (06/14 0815) BP: 131/59 mmHg (06/14 0815) Pulse Rate: 79 (06/14 0815)  Labs:  Recent Labs  11/27/14 1251 11/27/14 1930 11/28/14 0102 11/28/14 0630  HGB 14.3  --   --  13.6  HCT 47.3*  --   --  45.1  PLT 176  --   --  190  HEPARINUNFRC  --   --  <0.10*  --   CREATININE 1.03*  --   --   --   TROPONINI  --  <0.03 0.16* <0.03    Estimated Creatinine Clearance: 30 mL/min (by C-G formula based on Cr of 1.03).   Medical History: Past Medical History  Diagnosis Date  . Complete heart block   . Hypercholesterolemia   . Polycythemia vera(238.4)   . Syncope   . Hypertension   . Stroke     Assessment: 14 YOF with polycythemia vera who had SOB x2 weeks and then was found to have BL PE. She was started on heparin drip in the ED. Discussed with Dr. Coralyn Pear this morning, and decision made to switch her to Eliquis.  She does not meet any qualifications for dose reduction for PE treatment. SCr 1 (baseline appears to be ~0.7-0.8), CrCl ~78mL/min right now.  hgb and plts WNL. No overt bleeding noted, though she will have a hemoccult performed d/t some dark stools.  Goal of Therapy:    Monitor platelets by anticoagulation protocol: Yes   Plan:  -stop heparin -at the same time heparin is stopped, start Eliquis 10mg  PO BID x7 days, then to transition to 5mg  PO BID -CBC q72h while in the hospital -will educate prior to discharge -pharmacy will sign off as no dose adjustments are needed, but will follow peripherally  Hadiya Spoerl D. Mliss Wedin, PharmD, BCPS Clinical Pharmacist Pager:  [phone] 11/28/2014 9:17 AM

## 2014-11-28 NOTE — Progress Notes (Signed)
Patient Demographics:    [name], is a 81 y.o. female, DOB - [birth year], GTX:[id number]  Admit date - 11/27/2014   Admitting Physician Orson Eva, MD  Outpatient Primary MD for the patient is Vidal Schwalbe, MD  LOS - 1   Chief Complaint  Patient presents with  . Chest Pain  . Nausea        Subjective:   [name] is a 81 y.o. female with a history of hypertension, polycythemia rubra vera, complete heart block status post PPM, and stroke who presented to the ED with a two-month history of dyspnea on exertion and left shoulder pain that has worsened significantly in the past 2 weeks. She denies any cough, fever, chills, congestion, nausea, vomiting or diarrhea. Yesterday she had complained of constipation     Assessment  & Plan :    Active Problems:   Pulmonary emboli   Acute pulmonary embolus   Polycythemia rubra vera   Leukocytosis   Acute pulmonary embolus in the settting of Polycythemia rubra vera - Likely a complication of polycythemia.  -O2 sat 97% on room air - Ambulatory around unit without dyspnea or fatigue - Discontinue heparin drip, begin Eliquis 10 mg po bid x 7 days, then transition to 5 mg po bid. She was given 1 month coupon for Eliquis.  -Anticipate discharge in the next 24 hours if she remains stable.  Constipation - Resolved to the point patient is now having loose stools multiple times per day - Patient reports darkness to stools - Obtain hemoccult  Leukocytosis - Chronic leukocytosis dating back to January 2015 - Remains afebrile and hemodynamically stable -Likely secondary to polycythemia  Splenomegaly - Most likely a result of polycythemia vera - LFTs normal  Left shoulder pain - Xray negative for any skeletal causes - Acetaminophen prn -Could  be referred pain from pulmonary embolism  Complete heart block -Status post permanent pacemaker  - Is pending at the time of this dictation  Hypertension - Continue amlodipine  Hyperlipidemia - Continue simvastatin    Code Status : Full Code  Family Communication  : Son at bedside  Disposition Plan  : Home when ready  Consults  :  Pharmacy  Procedures  : None  DVT Prophylaxis  :  Heparin  Lab Results  Component Value Date   PLT 190 11/28/2014    Inpatient Medications  Scheduled Meds: . amLODipine  5 mg Oral Daily  . apixaban  10 mg Oral BID   Followed by  . [START ON 12/05/2014] apixaban  5 mg Oral BID  . beta carotene w/minerals  1 tablet Oral Daily  . calcium-vitamin D  2 tablet Oral Daily  . docusate sodium  100 mg Oral BID  . [START ON 12/03/2014] hydroxyurea  500 mg Oral Q Sun  . pyridOXINE  100 mg Oral Daily  . senna  2 tablet Oral Daily  . simvastatin  10 mg Oral Daily  . sodium chloride  3 mL Intravenous Q12H   Continuous Infusions:  PRN Meds:.acetaminophen **OR** acetaminophen, ondansetron **OR** ondansetron (ZOFRAN) IV  Antibiotics  :  None  Anti-infectives    None        Objective:   Filed Vitals:   11/27/14 1827 11/27/14 2050 11/28/14  0551 11/28/14 0815  BP: 171/54 125/55 165/68 131/59  Pulse: 88 79 75 79  Temp:  98 F (36.7 C) 97.7 F (36.5 C) 97.7 F (36.5 C)  TempSrc:  Oral Oral Oral  Resp:  18 18 18   Height: 5\' 3"  (1.6 m)     Weight: 55.52 kg (122 lb 6.4 oz)     SpO2:  92% 97% 96%    Wt Readings from Last 3 Encounters:  11/27/14 55.52 kg (122 lb 6.4 oz)  09/21/14 57.471 kg (126 lb 11.2 oz)  09/18/14 57.335 kg (126 lb 6.4 oz)     Intake/Output Summary (Last 24 hours) at 11/28/14 1231 Last data filed at 11/28/14 0810  Gross per 24 hour  Intake  483.9 ml  Output      0 ml  Net  483.9 ml     Physical Exam  General: In no acute distress, appears healthy and well nourished, sitting on the edge of bed. HENT:  Normocephalic, moist oral mucosa without erythema or exudates. Neck: Supple, no lymphadenopathy  Cardiac: RRR, no Murmurs, no LE edema noted, no JVD, peripheral pulses palpable at 2+ Respiratory: CTA bilaterally, good breath sounds Abdomen: Positive bowel sounds, soft, no masses, some distention in LUQ with obvious splenomegaly Skin:No rashes or edema Extremities: Symmetric without obvious trauma or injury, intact ROM, some tenderness to palpation at acromion process of L shoulder    Data Review:   Micro Results No results found for this or any previous visit (from the past 240 hour(s)).  Radiology Reports Dg Chest 2 View  11/27/2014   CLINICAL DATA:  Left arm pain into the chest  EXAM: CHEST  2 VIEW  COMPARISON:  12/08/2013  FINDINGS: Biventricular pacer from the right with duplicated right ventricular leads, stable from previous. Normal heart size and stable aortic/ hilar contours.  Linear opacities at the right base, new from prior. There is no edema, consolidation, effusion, or pneumothorax.  Remote left-sided rib fractures.  Splenomegaly, medially displacing the stomach. This is known from abdominal radiograph 2 days ago  IMPRESSION: Mild atelectasis at the right base.  No pneumonia or edema.   Electronically Signed   By: Monte Fantasia M.D.   On: 11/27/2014 13:27   Ct Angio Chest Pe W/cm &/or Wo Cm  11/27/2014   CLINICAL DATA:  Left arm pain radiating into the chest. Constipation. Aching and pain with recent left flank pain  EXAM: CT ANGIOGRAPHY CHEST  CT ABDOMEN AND PELVIS WITH CONTRAST  TECHNIQUE: Multidetector CT imaging of the chest was performed using the standard protocol during bolus administration of intravenous contrast. Multiplanar CT image reconstructions and MIPs were obtained to evaluate the vascular anatomy. Multidetector CT imaging of the abdomen and pelvis was performed using the standard protocol during bolus administration of intravenous contrast.  CONTRAST:  172mL  OMNIPAQUE IOHEXOL 350 MG/ML SOLN  COMPARISON:  Multiple exams, including 11/27/2014; 03/30/2009; 03/22/2013  FINDINGS: Despite efforts by the technologist and patient, motion artifact is present on today's exam and could not be eliminated. This reduces exam sensitivity and specificity.  CT CHEST FINDINGS  Mediastinum/Nodes: Bilateral acute pulmonary embolus extending in the right pulmonary artery, right upper lobe pulmonary artery, right lower lobe pulmonary artery, segmental branches in the left upper lobe, and subsegmental branches in the left lower lobe. RV:LV ratio 0.78, within normal limits.  Coronary, aortic arch, and branch vessel atherosclerotic vascular disease. Moderate cardiomegaly.  Calcification of the aortic and mitral valves.  Lungs/Pleura: Trace left pleural effusion. Mild  biapical pleural parenchymal scarring. Subsegmental atelectasis or scarring along both hemidiaphragms.  Musculoskeletal: Old healed left posterior rib fractures.  CT ABDOMEN PELVIS FINDINGS  Hepatobiliary: Bilobed 1.0 by 0.8 cm fluid density lesion anteriorly in segment 8 of the liver, similar to 2010. Other fluid density lesions are present in the liver including a 1.9 by 1.2 cm cyst in segment 3. Some of the lesions are technically too small to characterize although statistically likely to be cysts.  Pancreas: Dorsal pancreatic duct 2.5 mm, type 1 pancreas divisum.  Spleen: Splenomegaly observed, splenic volume 911 cc. Small accessory spleen.  Adrenals/Urinary Tract: Scattered Bosniak category 1 and category 2 cysts are present in the kidneys, some too small to characterize. AA 4.2 by 3.3 cm exophytic cystic lesion from the left kidney lower pole has faint calcification along its posterior margin, images 44-46 series 10. Some of the lesions are somewhat hyperdense, including a 1.0 cm lesion on image 40 of series 11 which is subject to volume averaging making it difficult to compare portal venous and delayed phase images, and  accordingly a small renal mass is difficult to confidently exclude. Several other mildly hyperdense lesions are similarly probably complex cysts but technically nonspecific.  Stomach/Bowel: Prominent sigmoid colon diverticulosis with scattered diverticula in the remainder the colon. No dilated bowel. New there is some mild stranding along the left paracolic gutter and splenic flexure but without definite findings of acute diverticulitis.  Vascular/Lymphatic: Aortoiliac atherosclerotic vascular disease.  Reproductive: Calcification posteriorly in the myometrium, likely a small fibroid, and only measuring 6 mm in diameter.  Other: No supplemental non-categorized findings.  Musculoskeletal: Chronic widening of the sacroiliac joints. Transitional L5 vertebra. Considerable facet arthropathy at multiple levels, particularly L4-5, with 6 mm anterolisthesis at L4-5 and resulting mild right foraminal impingement. Suspected central narrowing of the thecal sac at L4-5 due to facet arthropathy and disc bulge.  Chondrocalcinosis of the pubic symphysis.  Review of the MIP images confirms the above findings.  IMPRESSION: 1. Bilateral acute pulmonary embolus, moderate clot burden, right ventricular to left ventricular ratio within normal limits. 2. Multiple bilateral renal cysts. 3. There also hyperdense renal lesions which are technically indeterminate on today' s CT scan for solid versus cystic lesions. Consider renal protocol MRI with and without contrast in the nonacute setting for further workup, to ensure that none of these represent renal tumors. 4. Considerable splenomegaly, cause uncertain. I do not see other adenopathy in the chest, abdomen, or pelvis. 5. Numerous other findings including atherosclerosis, cardiomegaly, calcified aortic and mitral valves, trace left pleural effusion, hepatic cysts, type 1 pancreas divisum, prominent diverticulosis, small calcified uterine fibroid, chronically widened sacroiliac joints,  lumbar spondylosis and degenerative disc disease causing impingement, chondrocalcinosis of the pubic symphysis, and low-level stranding along the left paracolic gutter and splenic flexure but without definite findings of diverticulitis or other explaining cause. Critical Value/emergent results were called by telephone at the time of interpretation on 11/27/2014 at 9:56 pm to Dr. Roxanne Mins , who verbally acknowledged these results.   Electronically Signed   By: Van Clines M.D.   On: 11/27/2014 16:26   Ct Abdomen Pelvis W Contrast  11/27/2014   CLINICAL DATA:  Left arm pain radiating into the chest. Constipation. Aching and pain with recent left flank pain  EXAM: CT ANGIOGRAPHY CHEST  CT ABDOMEN AND PELVIS WITH CONTRAST  TECHNIQUE: Multidetector CT imaging of the chest was performed using the standard protocol during bolus administration of intravenous contrast. Multiplanar CT image reconstructions and MIPs  were obtained to evaluate the vascular anatomy. Multidetector CT imaging of the abdomen and pelvis was performed using the standard protocol during bolus administration of intravenous contrast.  CONTRAST:  137mL OMNIPAQUE IOHEXOL 350 MG/ML SOLN  COMPARISON:  Multiple exams, including 11/27/2014; 03/30/2009; 03/22/2013  FINDINGS: Despite efforts by the technologist and patient, motion artifact is present on today's exam and could not be eliminated. This reduces exam sensitivity and specificity.  CT CHEST FINDINGS  Mediastinum/Nodes: Bilateral acute pulmonary embolus extending in the right pulmonary artery, right upper lobe pulmonary artery, right lower lobe pulmonary artery, segmental branches in the left upper lobe, and subsegmental branches in the left lower lobe. RV:LV ratio 0.78, within normal limits.  Coronary, aortic arch, and branch vessel atherosclerotic vascular disease. Moderate cardiomegaly.  Calcification of the aortic and mitral valves.  Lungs/Pleura: Trace left pleural effusion. Mild biapical  pleural parenchymal scarring. Subsegmental atelectasis or scarring along both hemidiaphragms.  Musculoskeletal: Old healed left posterior rib fractures.  CT ABDOMEN PELVIS FINDINGS  Hepatobiliary: Bilobed 1.0 by 0.8 cm fluid density lesion anteriorly in segment 8 of the liver, similar to 2010. Other fluid density lesions are present in the liver including a 1.9 by 1.2 cm cyst in segment 3. Some of the lesions are technically too small to characterize although statistically likely to be cysts.  Pancreas: Dorsal pancreatic duct 2.5 mm, type 1 pancreas divisum.  Spleen: Splenomegaly observed, splenic volume 911 cc. Small accessory spleen.  Adrenals/Urinary Tract: Scattered Bosniak category 1 and category 2 cysts are present in the kidneys, some too small to characterize. AA 4.2 by 3.3 cm exophytic cystic lesion from the left kidney lower pole has faint calcification along its posterior margin, images 44-46 series 10. Some of the lesions are somewhat hyperdense, including a 1.0 cm lesion on image 40 of series 11 which is subject to volume averaging making it difficult to compare portal venous and delayed phase images, and accordingly a small renal mass is difficult to confidently exclude. Several other mildly hyperdense lesions are similarly probably complex cysts but technically nonspecific.  Stomach/Bowel: Prominent sigmoid colon diverticulosis with scattered diverticula in the remainder the colon. No dilated bowel. New there is some mild stranding along the left paracolic gutter and splenic flexure but without definite findings of acute diverticulitis.  Vascular/Lymphatic: Aortoiliac atherosclerotic vascular disease.  Reproductive: Calcification posteriorly in the myometrium, likely a small fibroid, and only measuring 6 mm in diameter.  Other: No supplemental non-categorized findings.  Musculoskeletal: Chronic widening of the sacroiliac joints. Transitional L5 vertebra. Considerable facet arthropathy at multiple  levels, particularly L4-5, with 6 mm anterolisthesis at L4-5 and resulting mild right foraminal impingement. Suspected central narrowing of the thecal sac at L4-5 due to facet arthropathy and disc bulge.  Chondrocalcinosis of the pubic symphysis.  Review of the MIP images confirms the above findings.  IMPRESSION: 1. Bilateral acute pulmonary embolus, moderate clot burden, right ventricular to left ventricular ratio within normal limits. 2. Multiple bilateral renal cysts. 3. There also hyperdense renal lesions which are technically indeterminate on today' s CT scan for solid versus cystic lesions. Consider renal protocol MRI with and without contrast in the nonacute setting for further workup, to ensure that none of these represent renal tumors. 4. Considerable splenomegaly, cause uncertain. I do not see other adenopathy in the chest, abdomen, or pelvis. 5. Numerous other findings including atherosclerosis, cardiomegaly, calcified aortic and mitral valves, trace left pleural effusion, hepatic cysts, type 1 pancreas divisum, prominent diverticulosis, small calcified uterine fibroid, chronically widened  sacroiliac joints, lumbar spondylosis and degenerative disc disease causing impingement, chondrocalcinosis of the pubic symphysis, and low-level stranding along the left paracolic gutter and splenic flexure but without definite findings of diverticulitis or other explaining cause. Critical Value/emergent results were called by telephone at the time of interpretation on 11/27/2014 at 6:62 pm to Dr. Roxanne Mins , who verbally acknowledged these results.   Electronically Signed   By: Van Clines M.D.   On: 11/27/2014 16:26   Dg Shoulder Left  11/27/2014   CLINICAL DATA:  Persistent chronic left shoulder pain for 1 month. Initial encounter.  EXAM: LEFT SHOULDER - 2+ VIEW  COMPARISON:  None.  FINDINGS: There is no evidence of fracture or dislocation. The left humeral head is seated within the glenoid fossa. The  acromioclavicular joint is unremarkable in appearance. No significant soft tissue abnormalities are seen. The visualized portions of the left lung are clear.  IMPRESSION: No evidence of fracture or dislocation.   Electronically Signed   By: Garald Balding M.D.   On: 11/27/2014 20:41   Dg Abd 2 Views  11/25/2014   CLINICAL DATA:  Abdominal pain for 4 days  EXAM: ABDOMEN - 2 VIEW  COMPARISON:  None.  FINDINGS: Scattered large and small bowel gas is noted. Fecal material is noted throughout the colon although no obstructive changes are seen. The spleen is enlarged of uncertain significance. No acute bony abnormality is seen. No obstructive changes are noted.  IMPRESSION: Splenomegaly.  Otherwise nonspecific abdomen.   Electronically Signed   By: Inez Catalina M.D.   On: 11/25/2014 14:24     CBC  Recent Labs Lab 11/27/14 1251 11/28/14 0630  WBC 26.3* 24.3*  HGB 14.3 13.6  HCT 47.3* 45.1  PLT 176 190  MCV 64.4* 64.1*  MCH 19.5* 19.3*  MCHC 30.2 30.2  RDW 24.5* 24.4*  LYMPHSABS  --  1.5  MONOABS  --  2.2*  EOSABS  --  1.0*  BASOSABS  --  0.2*    Chemistries   Recent Labs Lab 11/27/14 1251 11/28/14 0630  NA 140  --   K 4.3  --   CL 104  --   CO2 27  --   GLUCOSE 126*  --   BUN 21*  --   CREATININE 1.03*  --   CALCIUM 9.6  --   AST  --  31  ALT  --  18  ALKPHOS  --  87  BILITOT  --  1.1   ------------------------------------------------------------------------------------------------------------------ estimated creatinine clearance is 30 mL/min (by C-G formula based on Cr of 1.03). ------------------------------------------------------------------------------------------------------------------ No results for input(s): HGBA1C in the last 72 hours. ------------------------------------------------------------------------------------------------------------------ No results for input(s): CHOL, HDL, LDLCALC, TRIG, CHOLHDL, LDLDIRECT in the last 72  hours. ------------------------------------------------------------------------------------------------------------------ No results for input(s): TSH, T4TOTAL, T3FREE, THYROIDAB in the last 72 hours.  Invalid input(s): FREET3 ------------------------------------------------------------------------------------------------------------------ No results for input(s): VITAMINB12, FOLATE, FERRITIN, TIBC, IRON, RETICCTPCT in the last 72 hours.  Coagulation profile No results for input(s): INR, PROTIME in the last 168 hours.  No results for input(s): DDIMER in the last 72 hours.  Cardiac Enzymes  Recent Labs Lab 11/27/14 1930 11/28/14 0102 11/28/14 0630  TROPONINI <0.03 0.16* <0.03   ------------------------------------------------------------------------------------------------------------------ Invalid input(s): POCBNP    Shana Hornbeck, PA-S  Time Spent in minutes   25   Kelvin Cellar M.D on 11/28/2014 at 12:31 PM  Between 7am to 7pm - Pager - [phone]  After 7pm go to [email or web address] - password [email or web address]  [facility]  Office  [phone]    Addendum  I personally evaluated patient on 11/28/2014 and agree with the above findings. Patient is a pleasant 81 year old female with a past medical history polycythemia admitted to the medicine service overnight, she presented with complaints of shortness of breath over the past 2 weeks becoming progressively worse. 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 She was worked up with a CT scan of lungs with IV contrast that showed bilateral acute pulmonary emboli with moderate clot burden. She was started on IV heparin. This mornings evaluation she is hemodynamically stable and states feeling better, ambulated around the hallway without having significant shortness of breath. On exam she had clear  lungs. Transthoracic echocardiogram is pending. Risks and benefits of anticoagulation were discussed with patient and family members present at bedside. Will transition her to Eliquis today. Follow-up on transthoracic echocardiogram. Will monitor for the next 24 hours, if she remains hemodynamically stable can likely be discharged in morning.

## 2014-11-29 DIAGNOSIS — D72829 Elevated white blood cell count, unspecified: Secondary | ICD-10-CM

## 2014-11-29 DIAGNOSIS — D45 Polycythemia vera: Secondary | ICD-10-CM

## 2014-11-29 DIAGNOSIS — K922 Gastrointestinal hemorrhage, unspecified: Secondary | ICD-10-CM

## 2014-11-29 DIAGNOSIS — I2699 Other pulmonary embolism without acute cor pulmonale: Principal | ICD-10-CM

## 2014-11-29 LAB — BASIC METABOLIC PANEL
ANION GAP: 10 (ref 5–15)
BUN: 18 mg/dL (ref 6–20)
CHLORIDE: 106 mmol/L (ref 101–111)
CO2: 25 mmol/L (ref 22–32)
Calcium: 8.9 mg/dL (ref 8.9–10.3)
Creatinine, Ser: 0.78 mg/dL (ref 0.44–1.00)
GFR calc Af Amer: >60 mL/min (ref >60)
GFR calc non Af Amer: >60 mL/min (ref >60)
Glucose, Bld: 66 mg/dL (ref 65–99)
POTASSIUM: 3.6 mmol/L (ref 3.5–5.1)
SODIUM: 141 mmol/L (ref 135–145)

## 2014-11-29 LAB — CBC
HEMATOCRIT: 41.8 % (ref 36.0–46.0)
HEMOGLOBIN: 12.5 g/dL (ref 12.0–15.0)
MCH: 19.2 pg — ABNORMAL LOW (ref 26.0–34.0)
MCHC: 29.9 g/dL — AB (ref 30.0–36.0)
MCV: 64.1 fL — ABNORMAL LOW (ref 78.0–100.0)
Platelets: 169 10*3/uL (ref 150–400)
RBC: 6.52 MIL/uL — ABNORMAL HIGH (ref 3.87–5.11)
RDW: 24.2 % — ABNORMAL HIGH (ref 11.5–15.5)
WBC: 21.6 10*3/uL — AB (ref 4.0–10.5)

## 2014-11-29 LAB — HEPATITIS C ANTIBODY

## 2014-11-29 LAB — OCCULT BLOOD X 1 CARD TO LAB, STOOL: FECAL OCCULT BLD: POSITIVE — AB

## 2014-11-29 LAB — HEPATITIS B SURFACE ANTIGEN: HEP B S AG: NEGATIVE

## 2014-11-29 MED ORDER — PANTOPRAZOLE SODIUM 40 MG IV SOLR
40.0000 mg | Freq: Two times a day (BID) | INTRAVENOUS | Status: DC
  Administered 2014-11-29 – 2014-11-30 (×2): 40 mg via INTRAVENOUS
  Filled 2014-11-29 (×3): qty 40

## 2014-11-29 MED ORDER — BISACODYL 10 MG RE SUPP
10.0000 mg | Freq: Once | RECTAL | Status: AC
  Administered 2014-11-29: 10 mg via RECTAL
  Filled 2014-11-29: qty 1

## 2014-11-29 MED ORDER — NAPROXEN SODIUM 220 MG PO CAPS: 1.0000 | ORAL_CAPSULE | Freq: Two times a day (BID) | ORAL | Status: DC | PRN

## 2014-11-29 MED ORDER — SODIUM CHLORIDE 0.9 % IV SOLN
INTRAVENOUS | Status: DC
  Administered 2014-11-29: 20 mL/h via INTRAVENOUS

## 2014-11-29 MED ORDER — SENNOSIDES-DOCUSATE SODIUM 8.6-50 MG PO TABS: 1.0000 | ORAL_TABLET | Freq: Every day | ORAL | Status: DC

## 2014-11-29 MED ORDER — APIXABAN 5 MG PO TABS: 10.0000 mg | ORAL_TABLET | Freq: Two times a day (BID) | ORAL | Status: DC

## 2014-11-29 MED ORDER — APIXABAN 5 MG PO TABS: 5.0000 mg | ORAL_TABLET | Freq: Two times a day (BID) | ORAL | Status: DC

## 2014-11-29 NOTE — Consult Note (Signed)
[facility] CONSULT Reason for consult: G.I. bleeding Referring Physician: [facility]. PCP: Dr. Loreli Slot [name] is an 81 y.o. female.  HPI: she was admitted to the hospital with left-sided pain. CT showed splenomegaly which is felt to be due to polycythemia vera and acute pulmonary embolus on CT angiogram of the chest. The patient is quite active but it'd been having vague intermittent shortness of breath for several days. She also notes that she had had dark stools intermittently for a couple weeks. No abdominal pain or nausea. She states that she takes Aleve once or twice a day. She also takes aspirin 81 mg daily. She is on hydroxyurea for polycythemia vera and is followed by Dr Marin Olp.. Her other problems include history of syncope and complete heart block requiring pacemaker. She has never had EGD or colonoscopy. She has no knowledge of her family history since she is adopted. She was started on Eliquis for the pulmonary embolus and then had dark stools which were positive for blood.  Past Medical History  Diagnosis Date  . Complete heart block   . Hypercholesterolemia   . Polycythemia vera(238.4)   . Syncope   . Hypertension   . Stroke     Past Surgical History  Procedure Laterality Date  . Pacemaker insertion  03/26/09    MDT Adapta DR, revised for RV lead fracture by Dr Doreatha Lew  . Hand surgery      S/P Carpal tunnel repair  . Appendectomy    . Transthoracic echocardiogram  03/26/2009  . US echocardiography  03/01/2009    EF 55-60%    Family History  Problem Relation Age of Onset  . Adopted: Yes  . Migraines Son   . Heart disease Son     CABGx5  . Stroke Son 40  . Canavan disease Son     prostate  . Pulmonary embolism Daughter     x2 (age 74 & 10)  . Deep vein thrombosis Daughter   . Asthma Daughter   . Migraines Daughter   . Depression Daughter   . Other Daughter     Acid reflux    Social History:  reports that she has never smoked.  She has never used smokeless tobacco. She reports that she does not drink alcohol or use illicit drugs.  Allergies:  Allergies  Allergen Reactions  . Chocolate Other (See Comments)    migraine  . Pollen Extract     sneezing    Medications; Prior to Admission medications   Medication Sig Start Date End Date Taking? Authorizing Provider  amLODipine (NORVASC) 5 MG tablet Take 5 mg by mouth daily.     Yes Historical Provider, MD  aspirin 325 MG tablet Take 325 mg by mouth daily.    Yes Historical Provider, MD  aspirin 81 MG chewable tablet Chew 81 mg by mouth daily.   Yes Historical Provider, MD  beta carotene w/minerals (OCUVITE) tablet Take 1 tablet by mouth daily.     Yes Historical Provider, MD  calcium-vitamin D (OSCAL WITH D) 500-200 MG-UNIT per tablet Take 2 tablets by mouth daily.     Yes Historical Provider, MD  hydroxyurea (HYDREA) 500 MG capsule TAKE 1 CAPSULE (500 MG TOTAL) BY MOUTH ONCE A WEEK. MAY TAKE WITH FOOD TO MINIMIZE GI SIDE EFFECTS. 08/18/14  Yes Volanda Napoleon, MD  Polyethyl Glycol-Propyl Glycol (SYSTANE ULTRA OP) Apply 1 drop to eye 2 (two) times daily.    Yes Historical Provider, MD  pyridOXINE (VITAMIN  B-6) 100 MG tablet Take 100 mg by mouth daily.   Yes Historical Provider, MD  simvastatin (ZOCOR) 10 MG tablet Take 10 mg by mouth daily.     Yes Historical Provider, MD  apixaban (ELIQUIS) 5 MG TABS tablet Take 2 tablets (10 mg total) by mouth 2 (two) times daily. 11/29/14   Debbe Odea, MD  apixaban (ELIQUIS) 5 MG TABS tablet Take 1 tablet (5 mg total) by mouth 2 (two) times daily. 12/05/14   Debbe Odea, MD  Naproxen Sodium (ALEVE) 220 MG CAPS Take 1 capsule (220 mg total) by mouth 2 (two) times daily as needed. 11/29/14   Debbe Odea, MD  senna-docusate (SENOKOT-S) 8.6-50 MG per tablet Take 1-2 tablets by mouth daily. 11/29/14   Debbe Odea, MD   . amLODipine  5 mg Oral Daily  . beta carotene w/minerals  1 tablet Oral Daily  . calcium-vitamin D  2 tablet Oral  Daily  . docusate sodium  100 mg Oral BID  . [START ON 12/03/2014] hydroxyurea  500 mg Oral Q Sun  . pyridOXINE  100 mg Oral Daily  . senna  2 tablet Oral Daily  . simvastatin  10 mg Oral Daily  . sodium chloride  3 mL Intravenous Q12H   PRN Meds acetaminophen **OR** acetaminophen, ondansetron **OR** ondansetron (ZOFRAN) IV Results for orders placed or performed during the hospital encounter of 11/27/14 (from the past 48 hour(s))  Troponin I (q 6hr x 3)     Status: None   Collection Time: 11/27/14  7:30 PM  Result Value Ref Range   Troponin I <0.03 <0.031 ng/mL    Comment:        NO INDICATION OF MYOCARDIAL INJURY.   Heparin level (unfractionated)     Status: Abnormal   Collection Time: 11/28/14  1:02 AM  Result Value Ref Range   Heparin Unfractionated <0.10 (L) 0.30 - 0.70 IU/mL    Comment:        IF HEPARIN RESULTS ARE BELOW EXPECTED VALUES, AND PATIENT DOSAGE HAS BEEN CONFIRMED, SUGGEST FOLLOW UP TESTING OF ANTITHROMBIN III LEVELS.   Troponin I (q 6hr x 3)     Status: Abnormal   Collection Time: 11/28/14  1:02 AM  Result Value Ref Range   Troponin I 0.16 (H) <0.031 ng/mL    Comment:        PERSISTENTLY INCREASED TROPONIN VALUES IN THE RANGE OF 0.04-0.49 ng/mL CAN BE SEEN IN:       -UNSTABLE ANGINA       -CONGESTIVE HEART FAILURE       -MYOCARDITIS       -CHEST TRAUMA       -ARRYHTHMIAS       -LATE PRESENTING MYOCARDIAL INFARCTION       -COPD   CLINICAL FOLLOW-UP RECOMMENDED.   Troponin I (q 6hr x 3)     Status: None   Collection Time: 11/28/14  6:30 AM  Result Value Ref Range   Troponin I <0.03 <0.031 ng/mL    Comment:        NO INDICATION OF MYOCARDIAL INJURY.   CBC with Differential/Platelet     Status: Abnormal   Collection Time: 11/28/14  6:30 AM  Result Value Ref Range   WBC 24.3 (H) 4.0 - 10.5 K/uL    Comment: REPEATED TO VERIFY WHITE COUNT CONFIRMED ON SMEAR    RBC 7.04 (H) 3.87 - 5.11 MIL/uL   Hemoglobin 13.6 12.0 - 15.0 g/dL   HCT 45.1 36.0 -  46.0 %   MCV 64.1 (L) 78.0 - 100.0 fL   MCH 19.3 (L) 26.0 - 34.0 pg   MCHC 30.2 30.0 - 36.0 g/dL   RDW 24.4 (H) 11.5 - 15.5 %   Platelets 190 150 - 400 K/uL    Comment: REPEATED TO VERIFY PLATELET COUNT CONFIRMED BY SMEAR    Neutrophils Relative % 80 (H) 43 - 77 %   Lymphocytes Relative 6 (L) 12 - 46 %   Monocytes Relative 9 3 - 12 %   Eosinophils Relative 4 0 - 5 %   Basophils Relative 1 0 - 1 %   Neutro Abs 19.4 (H) 1.7 - 7.7 K/uL   Lymphs Abs 1.5 0.7 - 4.0 K/uL   Monocytes Absolute 2.2 (H) 0.1 - 1.0 K/uL   Eosinophils Absolute 1.0 (H) 0.0 - 0.7 K/uL   Basophils Absolute 0.2 (H) 0.0 - 0.1 K/uL   RBC Morphology POLYCHROMASIA PRESENT     Comment: ELLIPTOCYTES BURR CELLS    WBC Morphology VACUOLATED NEUTROPHILS     Comment: MILD LEFT SHIFT (1-5% METAS, OCC MYELO, OCC BANDS)   Smear Review LARGE PLATELETS PRESENT   Hepatic function panel     Status: Abnormal   Collection Time: 11/28/14  6:30 AM  Result Value Ref Range   Total Protein 6.0 (L) 6.5 - 8.1 g/dL   Albumin 3.2 (L) 3.5 - 5.0 g/dL   AST 31 15 - 41 U/L   ALT 18 14 - 54 U/L   Alkaline Phosphatase 87 38 - 126 U/L   Total Bilirubin 1.1 0.3 - 1.2 mg/dL   Bilirubin, Direct 0.2 0.1 - 0.5 mg/dL   Indirect Bilirubin 0.9 0.3 - 0.9 mg/dL  Hepatitis C antibody     Status: None   Collection Time: 11/28/14  6:30 AM  Result Value Ref Range   HCV Ab <0.1 0.0 - 0.9 s/co ratio    Comment: (NOTE)                                  Negative:     < 0.8                             Indeterminate: 0.8 - 0.9                                  Positive:     > 0.9 The CDC recommends that a positive HCV antibody result be followed up with a HCV Nucleic Acid Amplification test (389373). Performed At: [facility] [place], [place] [id number] Lindon Romp MD [id number]   Hepatitis B surface antigen     Status: None   Collection Time: 11/28/14  6:30 AM  Result Value Ref Range   Hepatitis B Surface Ag Negative  Negative    Comment: (NOTE) Performed At: [facility] [place], [place] [id number] Lindon Romp MD OE:[id number]   CBC     Status: Abnormal   Collection Time: 11/29/14  4:37 AM  Result Value Ref Range   WBC 21.6 (H) 4.0 - 10.5 K/uL   RBC 6.52 (H) 3.87 - 5.11 MIL/uL   Hemoglobin 12.5 12.0 - 15.0 g/dL   HCT 41.8 36.0 - 46.0 %   MCV 64.1 (L) 78.0 - 100.0 fL  MCH 19.2 (L) 26.0 - 34.0 pg   MCHC 29.9 (L) 30.0 - 36.0 g/dL   RDW 24.2 (H) 11.5 - 15.5 %   Platelets 169 150 - 400 K/uL  Basic metabolic panel     Status: None   Collection Time: 11/29/14  4:37 AM  Result Value Ref Range   Sodium 141 135 - 145 mmol/L   Potassium 3.6 3.5 - 5.1 mmol/L   Chloride 106 101 - 111 mmol/L   CO2 25 22 - 32 mmol/L   Glucose, Bld 66 65 - 99 mg/dL   BUN 18 6 - 20 mg/dL   Creatinine, Ser 0.78 0.44 - 1.00 mg/dL   Calcium 8.9 8.9 - 10.3 mg/dL   GFR calc non Af Amer >60 >60 mL/min   GFR calc Af Amer >60 >60 mL/min    Comment: (NOTE) The eGFR has been calculated using the CKD EPI equation. This calculation has not been validated in all clinical situations. eGFR's persistently <60 mL/min signify possible Chronic Kidney Disease.    Anion gap 10 5 - 15  Occult blood card to lab, stool RN will collect     Status: Abnormal   Collection Time: 11/29/14  1:47 PM  Result Value Ref Range   Fecal Occult Bld POSITIVE (A) NEGATIVE    Ct Angio Chest Pe W/cm &/or Wo Cm  11/27/2014   CLINICAL DATA:  Left arm pain radiating into the chest. Constipation. Aching and pain with recent left flank pain  EXAM: CT ANGIOGRAPHY CHEST  CT ABDOMEN AND PELVIS WITH CONTRAST  TECHNIQUE: Multidetector CT imaging of the chest was performed using the standard protocol during bolus administration of intravenous contrast. Multiplanar CT image reconstructions and MIPs were obtained to evaluate the vascular anatomy. Multidetector CT imaging of the abdomen and pelvis was performed using the standard protocol during  bolus administration of intravenous contrast.  CONTRAST:  118m OMNIPAQUE IOHEXOL 350 MG/ML SOLN  COMPARISON:  Multiple exams, including 11/27/2014; 03/30/2009; 03/22/2013  FINDINGS: Despite efforts by the technologist and patient, motion artifact is present on today's exam and could not be eliminated. This reduces exam sensitivity and specificity.  CT CHEST FINDINGS  Mediastinum/Nodes: Bilateral acute pulmonary embolus extending in the right pulmonary artery, right upper lobe pulmonary artery, right lower lobe pulmonary artery, segmental branches in the left upper lobe, and subsegmental branches in the left lower lobe. RV:LV ratio 0.78, within normal limits.  Coronary, aortic arch, and branch vessel atherosclerotic vascular disease. Moderate cardiomegaly.  Calcification of the aortic and mitral valves.  Lungs/Pleura: Trace left pleural effusion. Mild biapical pleural parenchymal scarring. Subsegmental atelectasis or scarring along both hemidiaphragms.  Musculoskeletal: Old healed left posterior rib fractures.  CT ABDOMEN PELVIS FINDINGS  Hepatobiliary: Bilobed 1.0 by 0.8 cm fluid density lesion anteriorly in segment 8 of the liver, similar to 2010. Other fluid density lesions are present in the liver including a 1.9 by 1.2 cm cyst in segment 3. Some of the lesions are technically too small to characterize although statistically likely to be cysts.  Pancreas: Dorsal pancreatic duct 2.5 mm, type 1 pancreas divisum.  Spleen: Splenomegaly observed, splenic volume 911 cc. Small accessory spleen.  Adrenals/Urinary Tract: Scattered Bosniak category 1 and category 2 cysts are present in the kidneys, some too small to characterize. AA 4.2 by 3.3 cm exophytic cystic lesion from the left kidney lower pole has faint calcification along its posterior margin, images 44-46 series 10. Some of the lesions are somewhat hyperdense, including a 1.0 cm lesion on  image 40 of series 11 which is subject to volume averaging making it  difficult to compare portal venous and delayed phase images, and accordingly a small renal mass is difficult to confidently exclude. Several other mildly hyperdense lesions are similarly probably complex cysts but technically nonspecific.  Stomach/Bowel: Prominent sigmoid colon diverticulosis with scattered diverticula in the remainder the colon. No dilated bowel. New there is some mild stranding along the left paracolic gutter and splenic flexure but without definite findings of acute diverticulitis.  Vascular/Lymphatic: Aortoiliac atherosclerotic vascular disease.  Reproductive: Calcification posteriorly in the myometrium, likely a small fibroid, and only measuring 6 mm in diameter.  Other: No supplemental non-categorized findings.  Musculoskeletal: Chronic widening of the sacroiliac joints. Transitional L5 vertebra. Considerable facet arthropathy at multiple levels, particularly L4-5, with 6 mm anterolisthesis at L4-5 and resulting mild right foraminal impingement. Suspected central narrowing of the thecal sac at L4-5 due to facet arthropathy and disc bulge.  Chondrocalcinosis of the pubic symphysis.  Review of the MIP images confirms the above findings.  IMPRESSION: 1. Bilateral acute pulmonary embolus, moderate clot burden, right ventricular to left ventricular ratio within normal limits. 2. Multiple bilateral renal cysts. 3. There also hyperdense renal lesions which are technically indeterminate on today' s CT scan for solid versus cystic lesions. Consider renal protocol MRI with and without contrast in the nonacute setting for further workup, to ensure that none of these represent renal tumors. 4. Considerable splenomegaly, cause uncertain. I do not see other adenopathy in the chest, abdomen, or pelvis. 5. Numerous other findings including atherosclerosis, cardiomegaly, calcified aortic and mitral valves, trace left pleural effusion, hepatic cysts, type 1 pancreas divisum, prominent diverticulosis, small  calcified uterine fibroid, chronically widened sacroiliac joints, lumbar spondylosis and degenerative disc disease causing impingement, chondrocalcinosis of the pubic symphysis, and low-level stranding along the left paracolic gutter and splenic flexure but without definite findings of diverticulitis or other explaining cause. Critical Value/emergent results were called by telephone at the time of interpretation on 11/27/2014 at 7:26 pm to Dr. Roxanne Mins , who verbally acknowledged these results.   Electronically Signed   By: Van Clines M.D.   On: 11/27/2014 16:26   Ct Abdomen Pelvis W Contrast  11/27/2014   CLINICAL DATA:  Left arm pain radiating into the chest. Constipation. Aching and pain with recent left flank pain  EXAM: CT ANGIOGRAPHY CHEST  CT ABDOMEN AND PELVIS WITH CONTRAST  TECHNIQUE: Multidetector CT imaging of the chest was performed using the standard protocol during bolus administration of intravenous contrast. Multiplanar CT image reconstructions and MIPs were obtained to evaluate the vascular anatomy. Multidetector CT imaging of the abdomen and pelvis was performed using the standard protocol during bolus administration of intravenous contrast.  CONTRAST:  181m OMNIPAQUE IOHEXOL 350 MG/ML SOLN  COMPARISON:  Multiple exams, including 11/27/2014; 03/30/2009; 03/22/2013  FINDINGS: Despite efforts by the technologist and patient, motion artifact is present on today's exam and could not be eliminated. This reduces exam sensitivity and specificity.  CT CHEST FINDINGS  Mediastinum/Nodes: Bilateral acute pulmonary embolus extending in the right pulmonary artery, right upper lobe pulmonary artery, right lower lobe pulmonary artery, segmental branches in the left upper lobe, and subsegmental branches in the left lower lobe. RV:LV ratio 0.78, within normal limits.  Coronary, aortic arch, and branch vessel atherosclerotic vascular disease. Moderate cardiomegaly.  Calcification of the aortic and mitral  valves.  Lungs/Pleura: Trace left pleural effusion. Mild biapical pleural parenchymal scarring. Subsegmental atelectasis or scarring along both hemidiaphragms.  Musculoskeletal:  Old healed left posterior rib fractures.  CT ABDOMEN PELVIS FINDINGS  Hepatobiliary: Bilobed 1.0 by 0.8 cm fluid density lesion anteriorly in segment 8 of the liver, similar to 2010. Other fluid density lesions are present in the liver including a 1.9 by 1.2 cm cyst in segment 3. Some of the lesions are technically too small to characterize although statistically likely to be cysts.  Pancreas: Dorsal pancreatic duct 2.5 mm, type 1 pancreas divisum.  Spleen: Splenomegaly observed, splenic volume 911 cc. Small accessory spleen.  Adrenals/Urinary Tract: Scattered Bosniak category 1 and category 2 cysts are present in the kidneys, some too small to characterize. AA 4.2 by 3.3 cm exophytic cystic lesion from the left kidney lower pole has faint calcification along its posterior margin, images 44-46 series 10. Some of the lesions are somewhat hyperdense, including a 1.0 cm lesion on image 40 of series 11 which is subject to volume averaging making it difficult to compare portal venous and delayed phase images, and accordingly a small renal mass is difficult to confidently exclude. Several other mildly hyperdense lesions are similarly probably complex cysts but technically nonspecific.  Stomach/Bowel: Prominent sigmoid colon diverticulosis with scattered diverticula in the remainder the colon. No dilated bowel. New there is some mild stranding along the left paracolic gutter and splenic flexure but without definite findings of acute diverticulitis.  Vascular/Lymphatic: Aortoiliac atherosclerotic vascular disease.  Reproductive: Calcification posteriorly in the myometrium, likely a small fibroid, and only measuring 6 mm in diameter.  Other: No supplemental non-categorized findings.  Musculoskeletal: Chronic widening of the sacroiliac joints.  Transitional L5 vertebra. Considerable facet arthropathy at multiple levels, particularly L4-5, with 6 mm anterolisthesis at L4-5 and resulting mild right foraminal impingement. Suspected central narrowing of the thecal sac at L4-5 due to facet arthropathy and disc bulge.  Chondrocalcinosis of the pubic symphysis.  Review of the MIP images confirms the above findings.  IMPRESSION: 1. Bilateral acute pulmonary embolus, moderate clot burden, right ventricular to left ventricular ratio within normal limits. 2. Multiple bilateral renal cysts. 3. There also hyperdense renal lesions which are technically indeterminate on today' s CT scan for solid versus cystic lesions. Consider renal protocol MRI with and without contrast in the nonacute setting for further workup, to ensure that none of these represent renal tumors. 4. Considerable splenomegaly, cause uncertain. I do not see other adenopathy in the chest, abdomen, or pelvis. 5. Numerous other findings including atherosclerosis, cardiomegaly, calcified aortic and mitral valves, trace left pleural effusion, hepatic cysts, type 1 pancreas divisum, prominent diverticulosis, small calcified uterine fibroid, chronically widened sacroiliac joints, lumbar spondylosis and degenerative disc disease causing impingement, chondrocalcinosis of the pubic symphysis, and low-level stranding along the left paracolic gutter and splenic flexure but without definite findings of diverticulitis or other explaining cause. Critical Value/emergent results were called by telephone at the time of interpretation on 11/27/2014 at 4:85 pm to Dr. Roxanne Mins , who verbally acknowledged these results.   Electronically Signed   By: Van Clines M.D.   On: 11/27/2014 16:26   Dg Shoulder Left  11/27/2014   CLINICAL DATA:  Persistent chronic left shoulder pain for 1 month. Initial encounter.  EXAM: LEFT SHOULDER - 2+ VIEW  COMPARISON:  None.  FINDINGS: There is no evidence of fracture or dislocation. The  left humeral head is seated within the glenoid fossa. The acromioclavicular joint is unremarkable in appearance. No significant soft tissue abnormalities are seen. The visualized portions of the left lung are clear.  IMPRESSION: No evidence of  fracture or dislocation.   Electronically Signed   By: Garald Balding M.D.   On: 11/27/2014 20:41               Blood pressure 157/58, pulse 63, temperature 98.1 F (36.7 C), temperature source Oral, resp. rate 18, height _0  (1.6 m), weight 55.52 kg (122 lb 6.4 oz), SpO2 92 %.  Physical exam:   General-- pleasant white female who appears much younger than her stated age ENT-- nonenteric mucous membranes moist Neck-- no lymphadenopathy Heart-- regular rate and rhythm without murmurs or gallops Lungs-- clear Abdomen-- soft and nontender Psych-- alert and oriented and appropriate   Assessment: 1. Subacute G.I. bleed. Probably upper G.I. based on recent dark stools and the fact that she has been taking Aleve on a regular basis. Suspect that she has small ulcers. 2. Recent pulmonary embolus documented on chest CT. Patient clearly needs to be on anticoagulation 3. Polycythemia vera 4. History of heart block with pacemaker  Plan: we will go ahead and plan EGD tomorrow. I will empirically start her on IV Protonix BID. Agree withholding Eliquis until we have identified her source of bleeding. Have discussed all this with the patient and her family.  [name],[place] [facility] 11/29/2014, 3:35 PM   Pager: [phone] If no answer or after hours call [phone]

## 2014-11-29 NOTE — Progress Notes (Signed)
[facility] Progress Note   BRITTANE GRUDZINSKI GXQ:[id number] DOB: 1922/11/12 DOA: 11/27/2014 PCP: Vidal Schwalbe, MD  Brief narrative: [name] is a 81 y.o. female with a history of polycythemia vera, hypertension, complete heart block with a pacemaker and CVA who presented to the hospital with shortness of breath and left shoulder pain and was found to have a pulmonary embolism.   Subjective: Patient had complaints of right sided abdominal pain worse with movement today.  She later told the pharmacist when discussing anticoagulation that she had a couple of black bowel movements yesterday.  Assessment/Plan: Principal Problem:   Pulmonary emboli -Bilateral pulmonary emboli with heavy clot burden -Initially started on heparin and then transitioned to Eliquis yesterday -Unfortunately due to GI bleed, we will need to stop anticoagulation for now  Active Problems:   GI bleed -2 black bowel movements yesterday which patient admitted to today-Hemoccult of stool sent ASAP which was positive-no history of GI bleed in the past or peptic ulcers -cannot recall having a colonoscopy -have spoken with Dr. Oletta Lamas from GI who will evaluate the patient today-he feels that he can do an endoscopy tomorrow  Right sided abdominal pain - appears to be muscular- related to movement and not severe at rest  Renal masses -Noted on CT - MRI of the renal protocol recommended  Splenomegaly - secondary to polycythemia vera    Polycythemia rubra vera -Continue Hydrea-hold aspirin  Left shoulder pain - Xray negative for any skeletal causes - Acetaminophen prn -Could be referred pain from pulmonary embolism  Complete heart block -Status post permanent pacemaker   Hypertension - Continue amlodipine  Hyperlipidemia - Continue simvastatin    Leukocytosis -Chronic- likely also related to PV   Code Status: Full code Disposition Plan: GI eval DVT prophylaxis: SCDs-given eliquis  today Consultants: GI Procedures:  Antibiotics: Anti-infectives    None      Objective: Filed Weights   11/27/14 1827  Weight: 55.52 kg (122 lb 6.4 oz)    Intake/Output Summary (Last 24 hours) at 11/29/14 1456 Last data filed at 11/28/14 1700  Gross per 24 hour  Intake    120 ml  Output      0 ml  Net    120 ml     Vitals Filed Vitals:   11/28/14 0815 11/28/14 1455 11/28/14 2013 11/29/14 0437  BP: 131/59 148/57 146/51 157/58  Pulse: 79 72 71 63  Temp: 97.7 F (36.5 C) 97.8 F (36.6 C) 98.4 F (36.9 C) 98.1 F (36.7 C)  TempSrc: Oral Oral Oral Oral  Resp: 18 18 18 18   Height:      Weight:      SpO2: 96% 95% 94% 92%    Exam:  General:  Pt is alert, not in acute distress  HEENT: No icterus, No thrush, oral mucosa moist  Cardiovascular: regular rate and rhythm, S1/S2 No murmur  Respiratory: clear to auscultation bilaterally   Abdomen: Soft, +Bowel sounds, non tender, non distended, no guarding  MSK: No LE edema, cyanosis or clubbing  Data Reviewed: Basic Metabolic Panel:  Recent Labs Lab 11/27/14 1251 11/29/14 0437  NA 140 141  K 4.3 3.6  CL 104 106  CO2 27 25  GLUCOSE 126* 66  BUN 21* 18  CREATININE 1.03* 0.78  CALCIUM 9.6 8.9   Liver Function Tests:  Recent Labs Lab 11/28/14 0630  AST 31  ALT 18  ALKPHOS 87  BILITOT 1.1  PROT 6.0*  ALBUMIN 3.2*   No results for  input(s): LIPASE, AMYLASE in the last 168 hours. No results for input(s): AMMONIA in the last 168 hours. CBC:  Recent Labs Lab 11/27/14 1251 11/28/14 0630 11/29/14 0437  WBC 26.3* 24.3* 21.6*  NEUTROABS  --  19.4*  --   HGB 14.3 13.6 12.5  HCT 47.3* 45.1 41.8  MCV 64.4* 64.1* 64.1*  PLT 176 190 169   Cardiac Enzymes:  Recent Labs Lab 11/27/14 1930 11/28/14 0102 11/28/14 0630  TROPONINI <0.03 0.16* <0.03   BNP (last 3 results) No results for input(s): BNP in the last 8760 hours.  ProBNP (last 3 results) No results for input(s): PROBNP in the last 8760  hours.  CBG: No results for input(s): GLUCAP in the last 168 hours.  No results found for this or any previous visit (from the past 240 hour(s)).   Studies: Ct Angio Chest Pe W/cm &/or Wo Cm  11/27/2014   CLINICAL DATA:  Left arm pain radiating into the chest. Constipation. Aching and pain with recent left flank pain  EXAM: CT ANGIOGRAPHY CHEST  CT ABDOMEN AND PELVIS WITH CONTRAST  TECHNIQUE: Multidetector CT imaging of the chest was performed using the standard protocol during bolus administration of intravenous contrast. Multiplanar CT image reconstructions and MIPs were obtained to evaluate the vascular anatomy. Multidetector CT imaging of the abdomen and pelvis was performed using the standard protocol during bolus administration of intravenous contrast.  CONTRAST:  147mL OMNIPAQUE IOHEXOL 350 MG/ML SOLN  COMPARISON:  Multiple exams, including 11/27/2014; 03/30/2009; 03/22/2013  FINDINGS: Despite efforts by the technologist and patient, motion artifact is present on today's exam and could not be eliminated. This reduces exam sensitivity and specificity.  CT CHEST FINDINGS  Mediastinum/Nodes: Bilateral acute pulmonary embolus extending in the right pulmonary artery, right upper lobe pulmonary artery, right lower lobe pulmonary artery, segmental branches in the left upper lobe, and subsegmental branches in the left lower lobe. RV:LV ratio 0.78, within normal limits.  Coronary, aortic arch, and branch vessel atherosclerotic vascular disease. Moderate cardiomegaly.  Calcification of the aortic and mitral valves.  Lungs/Pleura: Trace left pleural effusion. Mild biapical pleural parenchymal scarring. Subsegmental atelectasis or scarring along both hemidiaphragms.  Musculoskeletal: Old healed left posterior rib fractures.  CT ABDOMEN PELVIS FINDINGS  Hepatobiliary: Bilobed 1.0 by 0.8 cm fluid density lesion anteriorly in segment 8 of the liver, similar to 2010. Other fluid density lesions are present in the  liver including a 1.9 by 1.2 cm cyst in segment 3. Some of the lesions are technically too small to characterize although statistically likely to be cysts.  Pancreas: Dorsal pancreatic duct 2.5 mm, type 1 pancreas divisum.  Spleen: Splenomegaly observed, splenic volume 911 cc. Small accessory spleen.  Adrenals/Urinary Tract: Scattered Bosniak category 1 and category 2 cysts are present in the kidneys, some too small to characterize. AA 4.2 by 3.3 cm exophytic cystic lesion from the left kidney lower pole has faint calcification along its posterior margin, images 44-46 series 10. Some of the lesions are somewhat hyperdense, including a 1.0 cm lesion on image 40 of series 11 which is subject to volume averaging making it difficult to compare portal venous and delayed phase images, and accordingly a small renal mass is difficult to confidently exclude. Several other mildly hyperdense lesions are similarly probably complex cysts but technically nonspecific.  Stomach/Bowel: Prominent sigmoid colon diverticulosis with scattered diverticula in the remainder the colon. No dilated bowel. New there is some mild stranding along the left paracolic gutter and splenic flexure but without definite  findings of acute diverticulitis.  Vascular/Lymphatic: Aortoiliac atherosclerotic vascular disease.  Reproductive: Calcification posteriorly in the myometrium, likely a small fibroid, and only measuring 6 mm in diameter.  Other: No supplemental non-categorized findings.  Musculoskeletal: Chronic widening of the sacroiliac joints. Transitional L5 vertebra. Considerable facet arthropathy at multiple levels, particularly L4-5, with 6 mm anterolisthesis at L4-5 and resulting mild right foraminal impingement. Suspected central narrowing of the thecal sac at L4-5 due to facet arthropathy and disc bulge.  Chondrocalcinosis of the pubic symphysis.  Review of the MIP images confirms the above findings.  IMPRESSION: 1. Bilateral acute pulmonary  embolus, moderate clot burden, right ventricular to left ventricular ratio within normal limits. 2. Multiple bilateral renal cysts. 3. There also hyperdense renal lesions which are technically indeterminate on today' s CT scan for solid versus cystic lesions. Consider renal protocol MRI with and without contrast in the nonacute setting for further workup, to ensure that none of these represent renal tumors. 4. Considerable splenomegaly, cause uncertain. I do not see other adenopathy in the chest, abdomen, or pelvis. 5. Numerous other findings including atherosclerosis, cardiomegaly, calcified aortic and mitral valves, trace left pleural effusion, hepatic cysts, type 1 pancreas divisum, prominent diverticulosis, small calcified uterine fibroid, chronically widened sacroiliac joints, lumbar spondylosis and degenerative disc disease causing impingement, chondrocalcinosis of the pubic symphysis, and low-level stranding along the left paracolic gutter and splenic flexure but without definite findings of diverticulitis or other explaining cause. Critical Value/emergent results were called by telephone at the time of interpretation on 11/27/2014 at 9:92 pm to Dr. Roxanne Mins , who verbally acknowledged these results.   Electronically Signed   By: Van Clines M.D.   On: 11/27/2014 16:26   Ct Abdomen Pelvis W Contrast  11/27/2014   CLINICAL DATA:  Left arm pain radiating into the chest. Constipation. Aching and pain with recent left flank pain  EXAM: CT ANGIOGRAPHY CHEST  CT ABDOMEN AND PELVIS WITH CONTRAST  TECHNIQUE: Multidetector CT imaging of the chest was performed using the standard protocol during bolus administration of intravenous contrast. Multiplanar CT image reconstructions and MIPs were obtained to evaluate the vascular anatomy. Multidetector CT imaging of the abdomen and pelvis was performed using the standard protocol during bolus administration of intravenous contrast.  CONTRAST:  13mL OMNIPAQUE IOHEXOL  350 MG/ML SOLN  COMPARISON:  Multiple exams, including 11/27/2014; 03/30/2009; 03/22/2013  FINDINGS: Despite efforts by the technologist and patient, motion artifact is present on today's exam and could not be eliminated. This reduces exam sensitivity and specificity.  CT CHEST FINDINGS  Mediastinum/Nodes: Bilateral acute pulmonary embolus extending in the right pulmonary artery, right upper lobe pulmonary artery, right lower lobe pulmonary artery, segmental branches in the left upper lobe, and subsegmental branches in the left lower lobe. RV:LV ratio 0.78, within normal limits.  Coronary, aortic arch, and branch vessel atherosclerotic vascular disease. Moderate cardiomegaly.  Calcification of the aortic and mitral valves.  Lungs/Pleura: Trace left pleural effusion. Mild biapical pleural parenchymal scarring. Subsegmental atelectasis or scarring along both hemidiaphragms.  Musculoskeletal: Old healed left posterior rib fractures.  CT ABDOMEN PELVIS FINDINGS  Hepatobiliary: Bilobed 1.0 by 0.8 cm fluid density lesion anteriorly in segment 8 of the liver, similar to 2010. Other fluid density lesions are present in the liver including a 1.9 by 1.2 cm cyst in segment 3. Some of the lesions are technically too small to characterize although statistically likely to be cysts.  Pancreas: Dorsal pancreatic duct 2.5 mm, type 1 pancreas divisum.  Spleen: Splenomegaly observed,  splenic volume 911 cc. Small accessory spleen.  Adrenals/Urinary Tract: Scattered Bosniak category 1 and category 2 cysts are present in the kidneys, some too small to characterize. AA 4.2 by 3.3 cm exophytic cystic lesion from the left kidney lower pole has faint calcification along its posterior margin, images 44-46 series 10. Some of the lesions are somewhat hyperdense, including a 1.0 cm lesion on image 40 of series 11 which is subject to volume averaging making it difficult to compare portal venous and delayed phase images, and accordingly a small  renal mass is difficult to confidently exclude. Several other mildly hyperdense lesions are similarly probably complex cysts but technically nonspecific.  Stomach/Bowel: Prominent sigmoid colon diverticulosis with scattered diverticula in the remainder the colon. No dilated bowel. New there is some mild stranding along the left paracolic gutter and splenic flexure but without definite findings of acute diverticulitis.  Vascular/Lymphatic: Aortoiliac atherosclerotic vascular disease.  Reproductive: Calcification posteriorly in the myometrium, likely a small fibroid, and only measuring 6 mm in diameter.  Other: No supplemental non-categorized findings.  Musculoskeletal: Chronic widening of the sacroiliac joints. Transitional L5 vertebra. Considerable facet arthropathy at multiple levels, particularly L4-5, with 6 mm anterolisthesis at L4-5 and resulting mild right foraminal impingement. Suspected central narrowing of the thecal sac at L4-5 due to facet arthropathy and disc bulge.  Chondrocalcinosis of the pubic symphysis.  Review of the MIP images confirms the above findings.  IMPRESSION: 1. Bilateral acute pulmonary embolus, moderate clot burden, right ventricular to left ventricular ratio within normal limits. 2. Multiple bilateral renal cysts. 3. There also hyperdense renal lesions which are technically indeterminate on today' s CT scan for solid versus cystic lesions. Consider renal protocol MRI with and without contrast in the nonacute setting for further workup, to ensure that none of these represent renal tumors. 4. Considerable splenomegaly, cause uncertain. I do not see other adenopathy in the chest, abdomen, or pelvis. 5. Numerous other findings including atherosclerosis, cardiomegaly, calcified aortic and mitral valves, trace left pleural effusion, hepatic cysts, type 1 pancreas divisum, prominent diverticulosis, small calcified uterine fibroid, chronically widened sacroiliac joints, lumbar spondylosis and  degenerative disc disease causing impingement, chondrocalcinosis of the pubic symphysis, and low-level stranding along the left paracolic gutter and splenic flexure but without definite findings of diverticulitis or other explaining cause. Critical Value/emergent results were called by telephone at the time of interpretation on 11/27/2014 at 1:85 pm to Dr. Roxanne Mins , who verbally acknowledged these results.   Electronically Signed   By: Van Clines M.D.   On: 11/27/2014 16:26   Dg Shoulder Left  11/27/2014   CLINICAL DATA:  Persistent chronic left shoulder pain for 1 month. Initial encounter.  EXAM: LEFT SHOULDER - 2+ VIEW  COMPARISON:  None.  FINDINGS: There is no evidence of fracture or dislocation. The left humeral head is seated within the glenoid fossa. The acromioclavicular joint is unremarkable in appearance. No significant soft tissue abnormalities are seen. The visualized portions of the left lung are clear.  IMPRESSION: No evidence of fracture or dislocation.   Electronically Signed   By: Garald Balding M.D.   On: 11/27/2014 20:41    Scheduled Meds:  Scheduled Meds: . amLODipine  5 mg Oral Daily  . beta carotene w/minerals  1 tablet Oral Daily  . calcium-vitamin D  2 tablet Oral Daily  . docusate sodium  100 mg Oral BID  . [START ON 12/03/2014] hydroxyurea  500 mg Oral Q Sun  . pyridOXINE  100 mg Oral  Daily  . senna  2 tablet Oral Daily  . simvastatin  10 mg Oral Daily  . sodium chloride  3 mL Intravenous Q12H   Continuous Infusions:   Time spent on care of this patient: 35 min   [place], MD 11/29/2014, 2:56 PM  LOS: 2 days   [facility] Office  [phone] Pager - Text Page per [email or web address] If 7PM-7AM, please contact night-coverage [email or web address]

## 2014-11-29 NOTE — Care Management Note (Signed)
Case Management Note Initial CM noted started by [place]  Patient Details  Name: [name] MRN: [id number] Date of Birth: 11-10-1922  Subjective/Objective:   Pt admitted with PE                 Action/Plan:  Pt is independent from home.  Pt will go home on Eliquis 10 mg BID per MD and pharmacy.  CM will assist with medication set up post discharge.  CM will continue to monitor for disposition plan.   Expected Discharge Date:       11/29/14           Expected Discharge Plan:  [place]  In-House Referral:     Discharge planning Services  CM Consult, Medication Assistance, Medication Assistance  Post Acute Care Choice:    Choice offered to:     DME Arranged:    DME Agency:     HH Arranged:    HH Agency:     Status of Service:  Completed, signed off  Medicare Important Message Given:  Yes Date Medicare IM Given:  11/28/14 Medicare IM give by:  Elenor Quinones Date Additional Medicare IM Given:    Additional Medicare Important Message give by:     If discussed at [place] of Stay Meetings, dates discussed:    Additional Comments: CM submitted benefit check for Eliquis:  11/28/2014 [place] at [phone]. Talked to [place] (REF # [id number]). ELIQUIS is covered. No Prior Authorization required. Patient will have a retail pharmacy co-payment of [phone]. ELIQUIS available at several retail locations.    [facility].   CM provided free 30 day card to pt and instructed pt to provide card and prescription at preferred pharmacy [facility] [address] in [place], [place] contacted pharmacy and was informed that medication is available for filling.  CM informed pt that monthly copay would be $106.63, pt stated this may cause hardship.  CM informed pt that she could contact Eliquis manufacture directly and request assistance, pt stated she may. CM offered to dial Eliquis assistance number provided on pamphlet with patient actually  talking to service; pt refused and stated she wanted to discuss the cost of the medication with her children as they handle picking up and paying for prescriptions.  CM was informed that pts daughter may come visit during the evening, requested pt discuss with daughter or contact son regarding copay restraints and CM will follow up in this am.  Pharmacist made aware and informed MD.  CM placed sticky physician note on summary explaining concern.  CM assessed pt and was informed that pt lives at home independent without [facility] services and without [facility], pt son at bedside.  11/29/14-  Pt for d/c home today- per pharmacy she has spoken with pt and daughter at bedside and they have informed pharmacy that they will be able to handle the cost of Eliquis- pharmacy has encouraged pt/daughter to call the number on the Eliquis 30 day free card for pt assistance support- if they feel like they need further assistance with Eliquis.  Dawayne Patricia, RN 11/29/2014, 10:07 AM

## 2014-11-30 ENCOUNTER — Encounter (HOSPITAL_COMMUNITY)
Admission: EM | Disposition: A | Discharge status: Home / Self Care | Payer: Self-pay | Source: Home / Self Care | Attending: Internal Medicine

## 2014-11-30 ENCOUNTER — Inpatient Hospital Stay (HOSPITAL_COMMUNITY): Payer: Medicare Other | Admitting: Anesthesiology

## 2014-11-30 ENCOUNTER — Encounter (HOSPITAL_COMMUNITY): Payer: Self-pay | Admitting: *Deleted

## 2014-11-30 DIAGNOSIS — K2961 Other gastritis with bleeding: Secondary | ICD-10-CM

## 2014-11-30 DIAGNOSIS — R161 Splenomegaly, not elsewhere classified: Secondary | ICD-10-CM

## 2014-11-30 HISTORY — PX: ESOPHAGOGASTRODUODENOSCOPY: SHX5428

## 2014-11-30 LAB — CBC
HCT: 41 % (ref 36.0–46.0)
HEMOGLOBIN: 12.1 g/dL (ref 12.0–15.0)
MCH: 18.9 pg — ABNORMAL LOW (ref 26.0–34.0)
MCHC: 29.5 g/dL — ABNORMAL LOW (ref 30.0–36.0)
MCV: 64.1 fL — ABNORMAL LOW (ref 78.0–100.0)
Platelets: 183 10*3/uL (ref 150–400)
RBC: 6.4 MIL/uL — ABNORMAL HIGH (ref 3.87–5.11)
RDW: 24.2 % — ABNORMAL HIGH (ref 11.5–15.5)
WBC: 21.8 10*3/uL — AB (ref 4.0–10.5)

## 2014-11-30 SURGERY — EGD (ESOPHAGOGASTRODUODENOSCOPY)
Anesthesia: Monitor Anesthesia Care

## 2014-11-30 MED ORDER — SODIUM CHLORIDE 0.9 % IV SOLN
INTRAVENOUS | Status: DC | PRN
  Administered 2014-11-30: 13:00:00 via INTRAVENOUS

## 2014-11-30 MED ORDER — POLYETHYLENE GLYCOL 3350 17 G PO PACK
17.0000 g | PACK | Freq: Every day | ORAL | Status: DC
  Administered 2014-11-30: 17 g via ORAL
  Filled 2014-11-30 (×3): qty 1

## 2014-11-30 MED ORDER — FENTANYL CITRATE (PF) 100 MCG/2ML IJ SOLN
INTRAMUSCULAR | Status: DC | PRN
  Administered 2014-11-30 (×2): 50 ug via INTRAVENOUS

## 2014-11-30 MED ORDER — PANTOPRAZOLE SODIUM 40 MG PO TBEC: 40.0000 mg | DELAYED_RELEASE_TABLET | Freq: Every day | ORAL | Status: DC

## 2014-11-30 MED ORDER — PANTOPRAZOLE SODIUM 40 MG PO TBEC
40.0000 mg | DELAYED_RELEASE_TABLET | Freq: Every day | ORAL | Status: DC
  Administered 2014-11-30: 40 mg via ORAL
  Filled 2014-11-30: qty 1

## 2014-11-30 MED ORDER — LIDOCAINE HCL (CARDIAC) 20 MG/ML IV SOLN
INTRAVENOUS | Status: DC | PRN
  Administered 2014-11-30: 50 mg via INTRAVENOUS

## 2014-11-30 MED ORDER — PROPOFOL 10 MG/ML IV BOLUS
INTRAVENOUS | Status: DC | PRN
  Administered 2014-11-30: 20 mg via INTRAVENOUS

## 2014-11-30 MED ORDER — APIXABAN 5 MG PO TABS
5.0000 mg | ORAL_TABLET | Freq: Two times a day (BID) | ORAL | Status: DC
Start: 2014-12-05 — End: 2015-03-05

## 2014-11-30 MED ORDER — BUTAMBEN-TETRACAINE-BENZOCAINE 2-2-14 % EX AERO
INHALATION_SPRAY | CUTANEOUS | Status: DC | PRN
  Administered 2014-11-30: 2 via TOPICAL

## 2014-11-30 MED ORDER — APIXABAN 5 MG PO TABS: 10.0000 mg | ORAL_TABLET | Freq: Two times a day (BID) | ORAL | Status: DC

## 2014-11-30 MED ORDER — APIXABAN 5 MG PO TABS
10.0000 mg | ORAL_TABLET | Freq: Two times a day (BID) | ORAL | Status: DC
  Administered 2014-11-30: 10 mg via ORAL
  Filled 2014-11-30 (×3): qty 2

## 2014-11-30 NOTE — Op Note (Signed)
[facility] [place], [address]   ENDOSCOPY PROCEDURE REPORT  PATIENT: [name], [name]  MR#: [id number] BIRTHDATE: 06/11/1923 , 89  yrs. old GENDER: female ENDOSCOPIST:[place] Oletta Lamas, MD REFERRED BY: Harlan Stains, M.D. PROCEDURE DATE:  11/30/2014 PROCEDURE:   EGD, diagnostic ASA CLASS:    Class III INDICATIONS: patient had recent pulmonary embolus and was started on anticoagulation with positive stools.  She has been taking naproxen on a regular basis for arthritic pain. MEDICATION: Propofol 20 mg IV and Fentanyl 100 mcg IV TOPICAL ANESTHETIC:   Cetacaine Spray  DESCRIPTION OF PROCEDURE:   After the risks and benefits of the procedure were explained, informed consent was obtained.  The Pentax Gastroscope [id number]  endoscope was introduced through the mouth  and advanced to the second portion of the duodenum .  The instrument was slowly withdrawn as the mucosa was fully examined. Estimated blood loss is zero unless otherwise noted in this procedure report.      ESOPHAGUS: The mucosa of the esophagus appeared normal.  STOMACH: Erosive gastritis and linear ulcerations in the antrum without active bleeding.  DUODENUM: The duodenal mucosa showed no abnormalities in the entire duodenum.    Retroflexed views revealed no abnormalities.    The scope was then withdrawn from the patient and the procedure completed.  COMPLICATIONS: There were no immediate complications.  ENDOSCOPIC IMPRESSION: 1.   The mucosa of the esophagus appeared normal 2.   Erosive gastritis and linear ulcerations in the antrum without active bleeding.  This was probably the source of her bleeding while anticoagulated. 3.   The duodenal mucosa showed no abnormalities in the entire duodenum RECOMMENDATIONS: Would keep the patient on PPI.  Would go ahead and resume anticoagulation.  She has had a recent pulmonary embolus and needs to have this treated.  We can  watch carefully for further bleeding.    _______________________________ eSigned:  Laurence Spates, MD 11/30/2014 2:18 PM     cc: Harlan Stains, MD  CPT CODES: ICD CODES:  The ICD and CPT codes recommended by this software are interpretations from the data that the clinical staff has captured with the software.  The verification of the translation of this report to the ICD and CPT codes and modifiers is the sole responsibility of the health care institution and practicing physician where this report was generated.  [employer] will not be held responsible for the validity of the ICD and CPT codes included on this report.  AMA assumes no liability for data contained or not contained herein. CPT is a Designer, television/film set of the [employer].  PATIENT NAME:  [name], [name] MR#: [id number]

## 2014-11-30 NOTE — Interval H&P Note (Signed)
History and Physical Interval Note:  11/30/2014 1:32 PM  [name]  has presented today for surgery, with the diagnosis of GI bleed  The various methods of treatment have been discussed with the patient and family. After consideration of risks, benefits and other options for treatment, the patient has consented to  Procedure(s): ESOPHAGOGASTRODUODENOSCOPY (EGD) (N/A) as a surgical intervention .  The patient's history has been reviewed, patient examined, no change in status, stable for surgery.  I have reviewed the patient's chart and labs.  Questions were answered to the patient's satisfaction.     Avner Stroder JR,Arlen Legendre L

## 2014-11-30 NOTE — Anesthesia Postprocedure Evaluation (Signed)
Anesthesia Post Note  Patient: [name]  Procedure(s) Performed: Procedure(s) (LRB): ESOPHAGOGASTRODUODENOSCOPY (EGD) (N/A)  Anesthesia type: MAC  Patient location: PACU  Post pain: Pain level controlled and Adequate analgesia  Post assessment: Post-op Vital signs reviewed, Patient's Cardiovascular Status Stable and Respiratory Function Stable  Last Vitals:  Filed Vitals:   11/30/14 1400  BP: 136/25  Pulse: 72  Temp:   Resp: 16    Post vital signs: Reviewed and stable  Level of consciousness: awake, alert  and oriented  Complications: No apparent anesthesia complications

## 2014-11-30 NOTE — Anesthesia Preprocedure Evaluation (Signed)
Anesthesia Evaluation  Patient identified by MRN, date of birth, ID band Patient awake    Reviewed: Allergy & Precautions, NPO status , Patient's Chart, lab work & pertinent test results  Airway Mallampati: II   Neck ROM: full    Dental   Pulmonary neg pulmonary ROS,  breath sounds clear to auscultation        Cardiovascular hypertension, + dysrhythmias + pacemaker Rhythm:regular Rate:Normal  Complete heart block   Neuro/Psych CVA    GI/Hepatic   Endo/Other    Renal/GU      Musculoskeletal   Abdominal   Peds  Hematology   Anesthesia Other Findings   Reproductive/Obstetrics                             Anesthesia Physical Anesthesia Plan  ASA: III  Anesthesia Plan: MAC   Post-op Pain Management:    Induction: Intravenous  Airway Management Planned: Nasal Cannula  Additional Equipment:   Intra-op Plan:   Post-operative Plan:   Informed Consent: I have reviewed the patients History and Physical, chart, labs and discussed the procedure including the risks, benefits and alternatives for the proposed anesthesia with the patient or authorized representative who has indicated his/her understanding and acceptance.     Plan Discussed with: CRNA, Anesthesiologist and Surgeon  Anesthesia Plan Comments:         Anesthesia Quick Evaluation

## 2014-11-30 NOTE — Care Management Note (Signed)
Case Management Note Initial CM noted started by [place]  Patient Details  Name: [name] MRN: [id number] Date of Birth: 09/01/1922  Subjective/Objective:   Pt admitted with PE                 Action/Plan:  Pt is independent from home.  Pt will go home on Eliquis 10 mg BID per MD and pharmacy.  CM will assist with medication set up post discharge.  CM will continue to monitor for disposition plan.   Expected Discharge Date:       11/29/14           Expected Discharge Plan:  [place]  In-House Referral:     Discharge planning Services  CM Consult, Medication Assistance, Medication Assistance  Post Acute Care Choice:    Choice offered to:     DME Arranged:    DME Agency:     HH Arranged:    HH Agency:     Status of Service:  Completed, signed off  Medicare Important Message Given:  Yes Date Medicare IM Given:  11/28/14 Medicare IM give by:  Elenor Quinones Date Additional Medicare IM Given:  11/30/14  Additional Medicare Important Message give by:   Elenor Quinones  If discussed at [place] of Stay Meetings, dates discussed:    Additional Comments: CM submitted benefit check for Eliquis:  11/28/2014 [place] at [phone]. Talked to [facility] (REF # [id number]). ELIQUIS is covered. No Prior Authorization required. Patient will have a retail pharmacy co-payment of [phone]. ELIQUIS available at several retail locations.    [facility].   CM provided free 30 day card to pt and instructed pt to provide card and prescription at preferred pharmacy [facility] on [address] in [place], [place] contacted pharmacy and was informed that medication is available for filling.  CM informed pt that monthly copay would be $106.63, pt stated this may cause hardship.  CM informed pt that she could contact Eliquis manufacture directly and request assistance, pt stated she may. CM offered to dial Eliquis assistance number provided on pamphlet  with patient actually talking to service; pt refused and stated she wanted to discuss the cost of the medication with her children as they handle picking up and paying for prescriptions.  CM was informed that pts daughter may come visit during the evening, requested pt discuss with daughter or contact son regarding copay restraints and CM will follow up in this am.  Pharmacist made aware and informed MD.  CM placed sticky physician note on summary explaining concern.  CM assessed pt and was informed that pt lives at home independent without [facility] services and without [facility], pt son at bedside.  11/29/14-  Pt for d/c home today- per pharmacy she has spoken with pt and daughter at bedside and they have informed pharmacy that they will be able to handle the cost of Eliquis- pharmacy has encouraged pt/daughter to call the number on the Eliquis 30 day free card for pt assistance support- if they feel like they need further assistance with Eliquis.  Maryclare Labrador, RN 11/30/2014, 11:53 AM

## 2014-11-30 NOTE — Discharge Summary (Signed)
Physician Discharge Summary  [name] JJH:[id number] DOB: [birth year] DOA: 11/27/2014  PCP: Vidal Schwalbe, MD  Admit date: 11/27/2014 Discharge date: 11/30/2014  Time spent: 50 minutes  Recommendations for Outpatient Follow-up:  1. Renal masses seen on CT- MRI with renal protocol recommended   Discharge Condition: stable  Diet recommendation: heart healthy   Discharge Diagnoses:  Principal Problem:   Acute pulmonary embolus Active Problems:   GI bleed   Polycythemia rubra vera   Leukocytosis  Splenomegaly  HTN  History of present illness:  [name] is a 81 y.o. female with a history of polycythemia vera, hypertension, complete heart block with a pacemaker and CVA who presented to the hospital with shortness of breath and left shoulder pain and was found to have a pulmonary embolism.  Hospital Course:  Principal Problem:  Pulmonary emboli -Bilateral pulmonary emboli with heavy clot burden -Initially started on heparin and then transitioned to Eliquis which was held when found to be hemoccult positive- OK to resume per GI - see below  Active Problems:  GI bleed - she admitted to having black BMs over the course of the past couple of days-Hemoccult of stool sent which was positive-no history of GI bleed in the past or peptic ulcers -cannot recall having a colonoscopy - consulted GI- Dr Oletta Lamas did an EGD today and found a linear non-bleeding ulcer- see report below- recommended daily PPI and resumption of Eliquis  - no drop in Hb- asked RN to look as stools- rather than actually being black, they are dark brown and therefore I would no call this Melena - stop Naprosyn  Right sided abdominal pain - appears to be muscular- related to movement and not severe at rest  Renal masses -Noted on CT - MRI of the renal protocol recommended  Splenomegaly - secondary to polycythemia vera   Polycythemia rubra vera -Continue Hydrea-hold aspirin  Left shoulder  pain - Xray negative for any skeletal causes - Acetaminophen prn -Could be referred pain from pulmonary embolism  Complete heart block -Status post permanent pacemaker   Hypertension - Continue amlodipine  Hyperlipidemia - Continue simvastatin   Leukocytosis -Chronic- likely also related to PV  Procedures:  EGD  The mucosa of the esophagus appeared normal 2. Erosive gastritis and linear ulcerations in the antrum without active bleeding. This was probably the source of her bleeding while anticoagulated. 3. The duodenal mucosa showed no abnormalities in the entire duodenum RECOMMENDATIONS: Would keep the patient on PPI. Would go ahead and resume anticoagulation. She has had a recent pulmonary embolus and needs to have this treated. We can watch carefully for further bleeding.  Consultations:  [facility]  Discharge Exam: Filed Weights   11/27/14 1827  Weight: 55.52 kg (122 lb 6.4 oz)   Filed Vitals:   11/30/14 1420  BP: 161/45  Pulse: 72  Temp:   Resp: 19    General: AAO x 3, no distress Cardiovascular: RRR, no murmurs  Respiratory: clear to auscultation bilaterally GI: soft, non-tender, non-distended, bowel sound positive  Discharge Instructions You were cared for by a hospitalist during your hospital stay. If you have any questions about your discharge medications or the care you received while you were in the hospital after you are discharged, you can call the unit and asked to speak with the hospitalist on call if the hospitalist that took care of you is not available. Once you are discharged, your primary care physician will handle any further medical issues. Please note that  NO REFILLS for any discharge medications will be authorized once you are discharged, as it is imperative that you return to your primary care physician (or establish a relationship with a primary care physician if you do not have one) for your aftercare needs so that they can  reassess your need for medications and monitor your lab values.      Discharge Instructions    Diet - low sodium heart healthy    Complete by:  As directed      Increase activity slowly    Complete by:  As directed             Medication List    STOP taking these medications        ALEVE 220 MG Caps  Generic drug:  Naproxen Sodium     aspirin 325 MG tablet     aspirin 81 MG chewable tablet      TAKE these medications        amLODipine 5 MG tablet  Commonly known as:  NORVASC  Take 5 mg by mouth daily.     apixaban 5 MG Tabs tablet  Commonly known as:  ELIQUIS  Take 2 tablets (10 mg total) by mouth 2 (two) times daily.     apixaban 5 MG Tabs tablet  Commonly known as:  ELIQUIS  Take 1 tablet (5 mg total) by mouth 2 (two) times daily.  Start taking on:  12/05/2014     beta carotene w/minerals tablet  Take 1 tablet by mouth daily.     calcium-vitamin D 500-200 MG-UNIT per tablet  Commonly known as:  OSCAL WITH D  Take 2 tablets by mouth daily.     hydroxyurea 500 MG capsule  Commonly known as:  HYDREA  TAKE 1 CAPSULE (500 MG TOTAL) BY MOUTH ONCE A WEEK. MAY TAKE WITH FOOD TO MINIMIZE GI SIDE EFFECTS.     pantoprazole 40 MG tablet  Commonly known as:  PROTONIX  Take 1 tablet (40 mg total) by mouth daily. Switch for any other PPI at similar dose and frequency     pyridOXINE 100 MG tablet  Commonly known as:  VITAMIN B-6  Take 100 mg by mouth daily.     senna-docusate 8.6-50 MG per tablet  Commonly known as:  Senokot-S  Take 1-2 tablets by mouth daily.     simvastatin 10 MG tablet  Commonly known as:  ZOCOR  Take 10 mg by mouth daily.     SYSTANE ULTRA OP  Apply 1 drop to eye 2 (two) times daily.       Allergies  Allergen Reactions  . Chocolate Other (See Comments)    migraine  . Pollen Extract     sneezing      The results of significant diagnostics from this hospitalization (including imaging, microbiology, ancillary and laboratory) are  listed below for reference.    Significant Diagnostic Studies: Dg Chest 2 View  11/27/2014   CLINICAL DATA:  Left arm pain into the chest  EXAM: CHEST  2 VIEW  COMPARISON:  12/08/2013  FINDINGS: Biventricular pacer from the right with duplicated right ventricular leads, stable from previous. Normal heart size and stable aortic/ hilar contours.  Linear opacities at the right base, new from prior. There is no edema, consolidation, effusion, or pneumothorax.  Remote left-sided rib fractures.  Splenomegaly, medially displacing the stomach. This is known from abdominal radiograph 2 days ago  IMPRESSION: Mild atelectasis at the right base.  No pneumonia or edema.  Electronically Signed   By: Monte Fantasia M.D.   On: 11/27/2014 13:27   Ct Angio Chest Pe W/cm &/or Wo Cm  11/27/2014   CLINICAL DATA:  Left arm pain radiating into the chest. Constipation. Aching and pain with recent left flank pain  EXAM: CT ANGIOGRAPHY CHEST  CT ABDOMEN AND PELVIS WITH CONTRAST  TECHNIQUE: Multidetector CT imaging of the chest was performed using the standard protocol during bolus administration of intravenous contrast. Multiplanar CT image reconstructions and MIPs were obtained to evaluate the vascular anatomy. Multidetector CT imaging of the abdomen and pelvis was performed using the standard protocol during bolus administration of intravenous contrast.  CONTRAST:  119mL OMNIPAQUE IOHEXOL 350 MG/ML SOLN  COMPARISON:  Multiple exams, including 11/27/2014; 03/30/2009; 03/22/2013  FINDINGS: Despite efforts by the technologist and patient, motion artifact is present on today's exam and could not be eliminated. This reduces exam sensitivity and specificity.  CT CHEST FINDINGS  Mediastinum/Nodes: Bilateral acute pulmonary embolus extending in the right pulmonary artery, right upper lobe pulmonary artery, right lower lobe pulmonary artery, segmental branches in the left upper lobe, and subsegmental branches in the left lower lobe. RV:LV  ratio 0.78, within normal limits.  Coronary, aortic arch, and branch vessel atherosclerotic vascular disease. Moderate cardiomegaly.  Calcification of the aortic and mitral valves.  Lungs/Pleura: Trace left pleural effusion. Mild biapical pleural parenchymal scarring. Subsegmental atelectasis or scarring along both hemidiaphragms.  Musculoskeletal: Old healed left posterior rib fractures.  CT ABDOMEN PELVIS FINDINGS  Hepatobiliary: Bilobed 1.0 by 0.8 cm fluid density lesion anteriorly in segment 8 of the liver, similar to 2010. Other fluid density lesions are present in the liver including a 1.9 by 1.2 cm cyst in segment 3. Some of the lesions are technically too small to characterize although statistically likely to be cysts.  Pancreas: Dorsal pancreatic duct 2.5 mm, type 1 pancreas divisum.  Spleen: Splenomegaly observed, splenic volume 911 cc. Small accessory spleen.  Adrenals/Urinary Tract: Scattered Bosniak category 1 and category 2 cysts are present in the kidneys, some too small to characterize. AA 4.2 by 3.3 cm exophytic cystic lesion from the left kidney lower pole has faint calcification along its posterior margin, images 44-46 series 10. Some of the lesions are somewhat hyperdense, including a 1.0 cm lesion on image 40 of series 11 which is subject to volume averaging making it difficult to compare portal venous and delayed phase images, and accordingly a small renal mass is difficult to confidently exclude. Several other mildly hyperdense lesions are similarly probably complex cysts but technically nonspecific.  Stomach/Bowel: Prominent sigmoid colon diverticulosis with scattered diverticula in the remainder the colon. No dilated bowel. New there is some mild stranding along the left paracolic gutter and splenic flexure but without definite findings of acute diverticulitis.  Vascular/Lymphatic: Aortoiliac atherosclerotic vascular disease.  Reproductive: Calcification posteriorly in the myometrium, likely  a small fibroid, and only measuring 6 mm in diameter.  Other: No supplemental non-categorized findings.  Musculoskeletal: Chronic widening of the sacroiliac joints. Transitional L5 vertebra. Considerable facet arthropathy at multiple levels, particularly L4-5, with 6 mm anterolisthesis at L4-5 and resulting mild right foraminal impingement. Suspected central narrowing of the thecal sac at L4-5 due to facet arthropathy and disc bulge.  Chondrocalcinosis of the pubic symphysis.  Review of the MIP images confirms the above findings.  IMPRESSION: 1. Bilateral acute pulmonary embolus, moderate clot burden, right ventricular to left ventricular ratio within normal limits. 2. Multiple bilateral renal cysts. 3. There also hyperdense renal lesions  which are technically indeterminate on today' s CT scan for solid versus cystic lesions. Consider renal protocol MRI with and without contrast in the nonacute setting for further workup, to ensure that none of these represent renal tumors. 4. Considerable splenomegaly, cause uncertain. I do not see other adenopathy in the chest, abdomen, or pelvis. 5. Numerous other findings including atherosclerosis, cardiomegaly, calcified aortic and mitral valves, trace left pleural effusion, hepatic cysts, type 1 pancreas divisum, prominent diverticulosis, small calcified uterine fibroid, chronically widened sacroiliac joints, lumbar spondylosis and degenerative disc disease causing impingement, chondrocalcinosis of the pubic symphysis, and low-level stranding along the left paracolic gutter and splenic flexure but without definite findings of diverticulitis or other explaining cause. Critical Value/emergent results were called by telephone at the time of interpretation on 11/27/2014 at 7:35 pm to Dr. Roxanne Mins , who verbally acknowledged these results.   Electronically Signed   By: Van Clines M.D.   On: 11/27/2014 16:26   Ct Abdomen Pelvis W Contrast  11/27/2014   CLINICAL DATA:  Left  arm pain radiating into the chest. Constipation. Aching and pain with recent left flank pain  EXAM: CT ANGIOGRAPHY CHEST  CT ABDOMEN AND PELVIS WITH CONTRAST  TECHNIQUE: Multidetector CT imaging of the chest was performed using the standard protocol during bolus administration of intravenous contrast. Multiplanar CT image reconstructions and MIPs were obtained to evaluate the vascular anatomy. Multidetector CT imaging of the abdomen and pelvis was performed using the standard protocol during bolus administration of intravenous contrast.  CONTRAST:  146mL OMNIPAQUE IOHEXOL 350 MG/ML SOLN  COMPARISON:  Multiple exams, including 11/27/2014; 03/30/2009; 03/22/2013  FINDINGS: Despite efforts by the technologist and patient, motion artifact is present on today's exam and could not be eliminated. This reduces exam sensitivity and specificity.  CT CHEST FINDINGS  Mediastinum/Nodes: Bilateral acute pulmonary embolus extending in the right pulmonary artery, right upper lobe pulmonary artery, right lower lobe pulmonary artery, segmental branches in the left upper lobe, and subsegmental branches in the left lower lobe. RV:LV ratio 0.78, within normal limits.  Coronary, aortic arch, and branch vessel atherosclerotic vascular disease. Moderate cardiomegaly.  Calcification of the aortic and mitral valves.  Lungs/Pleura: Trace left pleural effusion. Mild biapical pleural parenchymal scarring. Subsegmental atelectasis or scarring along both hemidiaphragms.  Musculoskeletal: Old healed left posterior rib fractures.  CT ABDOMEN PELVIS FINDINGS  Hepatobiliary: Bilobed 1.0 by 0.8 cm fluid density lesion anteriorly in segment 8 of the liver, similar to 2010. Other fluid density lesions are present in the liver including a 1.9 by 1.2 cm cyst in segment 3. Some of the lesions are technically too small to characterize although statistically likely to be cysts.  Pancreas: Dorsal pancreatic duct 2.5 mm, type 1 pancreas divisum.  Spleen:  Splenomegaly observed, splenic volume 911 cc. Small accessory spleen.  Adrenals/Urinary Tract: Scattered Bosniak category 1 and category 2 cysts are present in the kidneys, some too small to characterize. AA 4.2 by 3.3 cm exophytic cystic lesion from the left kidney lower pole has faint calcification along its posterior margin, images 44-46 series 10. Some of the lesions are somewhat hyperdense, including a 1.0 cm lesion on image 40 of series 11 which is subject to volume averaging making it difficult to compare portal venous and delayed phase images, and accordingly a small renal mass is difficult to confidently exclude. Several other mildly hyperdense lesions are similarly probably complex cysts but technically nonspecific.  Stomach/Bowel: Prominent sigmoid colon diverticulosis with scattered diverticula in the remainder the colon. No  dilated bowel. New there is some mild stranding along the left paracolic gutter and splenic flexure but without definite findings of acute diverticulitis.  Vascular/Lymphatic: Aortoiliac atherosclerotic vascular disease.  Reproductive: Calcification posteriorly in the myometrium, likely a small fibroid, and only measuring 6 mm in diameter.  Other: No supplemental non-categorized findings.  Musculoskeletal: Chronic widening of the sacroiliac joints. Transitional L5 vertebra. Considerable facet arthropathy at multiple levels, particularly L4-5, with 6 mm anterolisthesis at L4-5 and resulting mild right foraminal impingement. Suspected central narrowing of the thecal sac at L4-5 due to facet arthropathy and disc bulge.  Chondrocalcinosis of the pubic symphysis.  Review of the MIP images confirms the above findings.  IMPRESSION: 1. Bilateral acute pulmonary embolus, moderate clot burden, right ventricular to left ventricular ratio within normal limits. 2. Multiple bilateral renal cysts. 3. There also hyperdense renal lesions which are technically indeterminate on today' s CT scan for  solid versus cystic lesions. Consider renal protocol MRI with and without contrast in the nonacute setting for further workup, to ensure that none of these represent renal tumors. 4. Considerable splenomegaly, cause uncertain. I do not see other adenopathy in the chest, abdomen, or pelvis. 5. Numerous other findings including atherosclerosis, cardiomegaly, calcified aortic and mitral valves, trace left pleural effusion, hepatic cysts, type 1 pancreas divisum, prominent diverticulosis, small calcified uterine fibroid, chronically widened sacroiliac joints, lumbar spondylosis and degenerative disc disease causing impingement, chondrocalcinosis of the pubic symphysis, and low-level stranding along the left paracolic gutter and splenic flexure but without definite findings of diverticulitis or other explaining cause. Critical Value/emergent results were called by telephone at the time of interpretation on 11/27/2014 at 4:12 pm to Dr. Roxanne Mins , who verbally acknowledged these results.   Electronically Signed   By: Van Clines M.D.   On: 11/27/2014 16:26   Dg Shoulder Left  11/27/2014   CLINICAL DATA:  Persistent chronic left shoulder pain for 1 month. Initial encounter.  EXAM: LEFT SHOULDER - 2+ VIEW  COMPARISON:  None.  FINDINGS: There is no evidence of fracture or dislocation. The left humeral head is seated within the glenoid fossa. The acromioclavicular joint is unremarkable in appearance. No significant soft tissue abnormalities are seen. The visualized portions of the left lung are clear.  IMPRESSION: No evidence of fracture or dislocation.   Electronically Signed   By: Garald Balding M.D.   On: 11/27/2014 20:41   Dg Abd 2 Views  11/25/2014   CLINICAL DATA:  Abdominal pain for 4 days  EXAM: ABDOMEN - 2 VIEW  COMPARISON:  None.  FINDINGS: Scattered large and small bowel gas is noted. Fecal material is noted throughout the colon although no obstructive changes are seen. The spleen is enlarged of uncertain  significance. No acute bony abnormality is seen. No obstructive changes are noted.  IMPRESSION: Splenomegaly.  Otherwise nonspecific abdomen.   Electronically Signed   By: Inez Catalina M.D.   On: 11/25/2014 14:24    Microbiology: No results found for this or any previous visit (from the past 240 hour(s)).   Labs: Basic Metabolic Panel:  Recent Labs Lab 11/27/14 1251 11/29/14 0437  NA 140 141  K 4.3 3.6  CL 104 106  CO2 27 25  GLUCOSE 126* 66  BUN 21* 18  CREATININE 1.03* 0.78  CALCIUM 9.6 8.9   Liver Function Tests:  Recent Labs Lab 11/28/14 0630  AST 31  ALT 18  ALKPHOS 87  BILITOT 1.1  PROT 6.0*  ALBUMIN 3.2*   No results  for input(s): LIPASE, AMYLASE in the last 168 hours. No results for input(s): AMMONIA in the last 168 hours. CBC:  Recent Labs Lab 11/27/14 1251 11/28/14 0630 11/29/14 0437 11/30/14 0435  WBC 26.3* 24.3* 21.6* 21.8*  NEUTROABS  --  19.4*  --   --   HGB 14.3 13.6 12.5 12.1  HCT 47.3* 45.1 41.8 41.0  MCV 64.4* 64.1* 64.1* 64.1*  PLT 176 190 169 183   Cardiac Enzymes:  Recent Labs Lab 11/27/14 1930 11/28/14 0102 11/28/14 0630  TROPONINI <0.03 0.16* <0.03   BNP: BNP (last 3 results) No results for input(s): BNP in the last 8760 hours.  ProBNP (last 3 results) No results for input(s): PROBNP in the last 8760 hours.  CBG: No results for input(s): GLUCAP in the last 168 hours.     SignedDebbe Odea, MD [facility] 11/30/2014, 3:30 PM

## 2014-11-30 NOTE — H&P (View-Only) (Signed)
[facility] CONSULT Reason for consult: G.I. bleeding Referring Physician: [facility]. PCP: Dr. Loreli Slot [name] is an 81 y.o. female.  HPI: she was admitted to the hospital with left-sided pain. CT showed splenomegaly which is felt to be due to polycythemia vera and acute pulmonary embolus on CT angiogram of the chest. The patient is quite active but it'd been having vague intermittent shortness of breath for several days. She also notes that she had had dark stools intermittently for a couple weeks. No abdominal pain or nausea. She states that she takes Aleve once or twice a day. She also takes aspirin 81 mg daily. She is on hydroxyurea for polycythemia vera and is followed by Dr Marin Olp.. Her other problems include history of syncope and complete heart block requiring pacemaker. She has never had EGD or colonoscopy. She has no knowledge of her family history since she is adopted. She was started on Eliquis for the pulmonary embolus and then had dark stools which were positive for blood.  Past Medical History  Diagnosis Date  . Complete heart block   . Hypercholesterolemia   . Polycythemia vera(238.4)   . Syncope   . Hypertension   . Stroke     Past Surgical History  Procedure Laterality Date  . Pacemaker insertion  03/26/09    MDT Adapta DR, revised for RV lead fracture by Dr Doreatha Lew  . Hand surgery      S/P Carpal tunnel repair  . Appendectomy    . Transthoracic echocardiogram  03/26/2009  . US echocardiography  03/01/2009    EF 55-60%    Family History  Problem Relation Age of Onset  . Adopted: Yes  . Migraines Son   . Heart disease Son     CABGx5  . Stroke Son 40  . Canavan disease Son     prostate  . Pulmonary embolism Daughter     x2 (age 74 & 10)  . Deep vein thrombosis Daughter   . Asthma Daughter   . Migraines Daughter   . Depression Daughter   . Other Daughter     Acid reflux    Social History:  reports that she has never smoked.  She has never used smokeless tobacco. She reports that she does not drink alcohol or use illicit drugs.  Allergies:  Allergies  Allergen Reactions  . Chocolate Other (See Comments)    migraine  . Pollen Extract     sneezing    Medications; Prior to Admission medications   Medication Sig Start Date End Date Taking? Authorizing Provider  amLODipine (NORVASC) 5 MG tablet Take 5 mg by mouth daily.     Yes Historical Provider, MD  aspirin 325 MG tablet Take 325 mg by mouth daily.    Yes Historical Provider, MD  aspirin 81 MG chewable tablet Chew 81 mg by mouth daily.   Yes Historical Provider, MD  beta carotene w/minerals (OCUVITE) tablet Take 1 tablet by mouth daily.     Yes Historical Provider, MD  calcium-vitamin D (OSCAL WITH D) 500-200 MG-UNIT per tablet Take 2 tablets by mouth daily.     Yes Historical Provider, MD  hydroxyurea (HYDREA) 500 MG capsule TAKE 1 CAPSULE (500 MG TOTAL) BY MOUTH ONCE A WEEK. MAY TAKE WITH FOOD TO MINIMIZE GI SIDE EFFECTS. 08/18/14  Yes Volanda Napoleon, MD  Polyethyl Glycol-Propyl Glycol (SYSTANE ULTRA OP) Apply 1 drop to eye 2 (two) times daily.    Yes Historical Provider, MD  pyridOXINE (VITAMIN  B-6) 100 MG tablet Take 100 mg by mouth daily.   Yes Historical Provider, MD  simvastatin (ZOCOR) 10 MG tablet Take 10 mg by mouth daily.     Yes Historical Provider, MD  apixaban (ELIQUIS) 5 MG TABS tablet Take 2 tablets (10 mg total) by mouth 2 (two) times daily. 11/29/14   Debbe Odea, MD  apixaban (ELIQUIS) 5 MG TABS tablet Take 1 tablet (5 mg total) by mouth 2 (two) times daily. 12/05/14   Debbe Odea, MD  Naproxen Sodium (ALEVE) 220 MG CAPS Take 1 capsule (220 mg total) by mouth 2 (two) times daily as needed. 11/29/14   Debbe Odea, MD  senna-docusate (SENOKOT-S) 8.6-50 MG per tablet Take 1-2 tablets by mouth daily. 11/29/14   Debbe Odea, MD   . amLODipine  5 mg Oral Daily  . beta carotene w/minerals  1 tablet Oral Daily  . calcium-vitamin D  2 tablet Oral  Daily  . docusate sodium  100 mg Oral BID  . [START ON 12/03/2014] hydroxyurea  500 mg Oral Q Sun  . pyridOXINE  100 mg Oral Daily  . senna  2 tablet Oral Daily  . simvastatin  10 mg Oral Daily  . sodium chloride  3 mL Intravenous Q12H   PRN Meds acetaminophen **OR** acetaminophen, ondansetron **OR** ondansetron (ZOFRAN) IV Results for orders placed or performed during the hospital encounter of 11/27/14 (from the past 48 hour(s))  Troponin I (q 6hr x 3)     Status: None   Collection Time: 11/27/14  7:30 PM  Result Value Ref Range   Troponin I <0.03 <0.031 ng/mL    Comment:        NO INDICATION OF MYOCARDIAL INJURY.   Heparin level (unfractionated)     Status: Abnormal   Collection Time: 11/28/14  1:02 AM  Result Value Ref Range   Heparin Unfractionated <0.10 (L) 0.30 - 0.70 IU/mL    Comment:        IF HEPARIN RESULTS ARE BELOW EXPECTED VALUES, AND PATIENT DOSAGE HAS BEEN CONFIRMED, SUGGEST FOLLOW UP TESTING OF ANTITHROMBIN III LEVELS.   Troponin I (q 6hr x 3)     Status: Abnormal   Collection Time: 11/28/14  1:02 AM  Result Value Ref Range   Troponin I 0.16 (H) <0.031 ng/mL    Comment:        PERSISTENTLY INCREASED TROPONIN VALUES IN THE RANGE OF 0.04-0.49 ng/mL CAN BE SEEN IN:       -UNSTABLE ANGINA       -CONGESTIVE HEART FAILURE       -MYOCARDITIS       -CHEST TRAUMA       -ARRYHTHMIAS       -LATE PRESENTING MYOCARDIAL INFARCTION       -COPD   CLINICAL FOLLOW-UP RECOMMENDED.   Troponin I (q 6hr x 3)     Status: None   Collection Time: 11/28/14  6:30 AM  Result Value Ref Range   Troponin I <0.03 <0.031 ng/mL    Comment:        NO INDICATION OF MYOCARDIAL INJURY.   CBC with Differential/Platelet     Status: Abnormal   Collection Time: 11/28/14  6:30 AM  Result Value Ref Range   WBC 24.3 (H) 4.0 - 10.5 K/uL    Comment: REPEATED TO VERIFY WHITE COUNT CONFIRMED ON SMEAR    RBC 7.04 (H) 3.87 - 5.11 MIL/uL   Hemoglobin 13.6 12.0 - 15.0 g/dL   HCT 45.1 36.0 -  46.0 %   MCV 64.1 (L) 78.0 - 100.0 fL   MCH 19.3 (L) 26.0 - 34.0 pg   MCHC 30.2 30.0 - 36.0 g/dL   RDW 24.4 (H) 11.5 - 15.5 %   Platelets 190 150 - 400 K/uL    Comment: REPEATED TO VERIFY PLATELET COUNT CONFIRMED BY SMEAR    Neutrophils Relative % 80 (H) 43 - 77 %   Lymphocytes Relative 6 (L) 12 - 46 %   Monocytes Relative 9 3 - 12 %   Eosinophils Relative 4 0 - 5 %   Basophils Relative 1 0 - 1 %   Neutro Abs 19.4 (H) 1.7 - 7.7 K/uL   Lymphs Abs 1.5 0.7 - 4.0 K/uL   Monocytes Absolute 2.2 (H) 0.1 - 1.0 K/uL   Eosinophils Absolute 1.0 (H) 0.0 - 0.7 K/uL   Basophils Absolute 0.2 (H) 0.0 - 0.1 K/uL   RBC Morphology POLYCHROMASIA PRESENT     Comment: ELLIPTOCYTES BURR CELLS    WBC Morphology VACUOLATED NEUTROPHILS     Comment: MILD LEFT SHIFT (1-5% METAS, OCC MYELO, OCC BANDS)   Smear Review LARGE PLATELETS PRESENT   Hepatic function panel     Status: Abnormal   Collection Time: 11/28/14  6:30 AM  Result Value Ref Range   Total Protein 6.0 (L) 6.5 - 8.1 g/dL   Albumin 3.2 (L) 3.5 - 5.0 g/dL   AST 31 15 - 41 U/L   ALT 18 14 - 54 U/L   Alkaline Phosphatase 87 38 - 126 U/L   Total Bilirubin 1.1 0.3 - 1.2 mg/dL   Bilirubin, Direct 0.2 0.1 - 0.5 mg/dL   Indirect Bilirubin 0.9 0.3 - 0.9 mg/dL  Hepatitis C antibody     Status: None   Collection Time: 11/28/14  6:30 AM  Result Value Ref Range   HCV Ab <0.1 0.0 - 0.9 s/co ratio    Comment: (NOTE)                                  Negative:     < 0.8                             Indeterminate: 0.8 - 0.9                                  Positive:     > 0.9 The CDC recommends that a positive HCV antibody result be followed up with a HCV Nucleic Acid Amplification test (389373). Performed At: [facility] [place], [place] [id number] Lindon Romp MD [id number]   Hepatitis B surface antigen     Status: None   Collection Time: 11/28/14  6:30 AM  Result Value Ref Range   Hepatitis B Surface Ag Negative  Negative    Comment: (NOTE) Performed At: [facility] [place], [place] [id number] Lindon Romp MD OE:[id number]   CBC     Status: Abnormal   Collection Time: 11/29/14  4:37 AM  Result Value Ref Range   WBC 21.6 (H) 4.0 - 10.5 K/uL   RBC 6.52 (H) 3.87 - 5.11 MIL/uL   Hemoglobin 12.5 12.0 - 15.0 g/dL   HCT 41.8 36.0 - 46.0 %   MCV 64.1 (L) 78.0 - 100.0 fL  MCH 19.2 (L) 26.0 - 34.0 pg   MCHC 29.9 (L) 30.0 - 36.0 g/dL   RDW 24.2 (H) 11.5 - 15.5 %   Platelets 169 150 - 400 K/uL  Basic metabolic panel     Status: None   Collection Time: 11/29/14  4:37 AM  Result Value Ref Range   Sodium 141 135 - 145 mmol/L   Potassium 3.6 3.5 - 5.1 mmol/L   Chloride 106 101 - 111 mmol/L   CO2 25 22 - 32 mmol/L   Glucose, Bld 66 65 - 99 mg/dL   BUN 18 6 - 20 mg/dL   Creatinine, Ser 0.78 0.44 - 1.00 mg/dL   Calcium 8.9 8.9 - 10.3 mg/dL   GFR calc non Af Amer >60 >60 mL/min   GFR calc Af Amer >60 >60 mL/min    Comment: (NOTE) The eGFR has been calculated using the CKD EPI equation. This calculation has not been validated in all clinical situations. eGFR's persistently <60 mL/min signify possible Chronic Kidney Disease.    Anion gap 10 5 - 15  Occult blood card to lab, stool RN will collect     Status: Abnormal   Collection Time: 11/29/14  1:47 PM  Result Value Ref Range   Fecal Occult Bld POSITIVE (A) NEGATIVE    Ct Angio Chest Pe W/cm &/or Wo Cm  11/27/2014   CLINICAL DATA:  Left arm pain radiating into the chest. Constipation. Aching and pain with recent left flank pain  EXAM: CT ANGIOGRAPHY CHEST  CT ABDOMEN AND PELVIS WITH CONTRAST  TECHNIQUE: Multidetector CT imaging of the chest was performed using the standard protocol during bolus administration of intravenous contrast. Multiplanar CT image reconstructions and MIPs were obtained to evaluate the vascular anatomy. Multidetector CT imaging of the abdomen and pelvis was performed using the standard protocol during  bolus administration of intravenous contrast.  CONTRAST:  118m OMNIPAQUE IOHEXOL 350 MG/ML SOLN  COMPARISON:  Multiple exams, including 11/27/2014; 03/30/2009; 03/22/2013  FINDINGS: Despite efforts by the technologist and patient, motion artifact is present on today's exam and could not be eliminated. This reduces exam sensitivity and specificity.  CT CHEST FINDINGS  Mediastinum/Nodes: Bilateral acute pulmonary embolus extending in the right pulmonary artery, right upper lobe pulmonary artery, right lower lobe pulmonary artery, segmental branches in the left upper lobe, and subsegmental branches in the left lower lobe. RV:LV ratio 0.78, within normal limits.  Coronary, aortic arch, and branch vessel atherosclerotic vascular disease. Moderate cardiomegaly.  Calcification of the aortic and mitral valves.  Lungs/Pleura: Trace left pleural effusion. Mild biapical pleural parenchymal scarring. Subsegmental atelectasis or scarring along both hemidiaphragms.  Musculoskeletal: Old healed left posterior rib fractures.  CT ABDOMEN PELVIS FINDINGS  Hepatobiliary: Bilobed 1.0 by 0.8 cm fluid density lesion anteriorly in segment 8 of the liver, similar to 2010. Other fluid density lesions are present in the liver including a 1.9 by 1.2 cm cyst in segment 3. Some of the lesions are technically too small to characterize although statistically likely to be cysts.  Pancreas: Dorsal pancreatic duct 2.5 mm, type 1 pancreas divisum.  Spleen: Splenomegaly observed, splenic volume 911 cc. Small accessory spleen.  Adrenals/Urinary Tract: Scattered Bosniak category 1 and category 2 cysts are present in the kidneys, some too small to characterize. AA 4.2 by 3.3 cm exophytic cystic lesion from the left kidney lower pole has faint calcification along its posterior margin, images 44-46 series 10. Some of the lesions are somewhat hyperdense, including a 1.0 cm lesion on  image 40 of series 11 which is subject to volume averaging making it  difficult to compare portal venous and delayed phase images, and accordingly a small renal mass is difficult to confidently exclude. Several other mildly hyperdense lesions are similarly probably complex cysts but technically nonspecific.  Stomach/Bowel: Prominent sigmoid colon diverticulosis with scattered diverticula in the remainder the colon. No dilated bowel. New there is some mild stranding along the left paracolic gutter and splenic flexure but without definite findings of acute diverticulitis.  Vascular/Lymphatic: Aortoiliac atherosclerotic vascular disease.  Reproductive: Calcification posteriorly in the myometrium, likely a small fibroid, and only measuring 6 mm in diameter.  Other: No supplemental non-categorized findings.  Musculoskeletal: Chronic widening of the sacroiliac joints. Transitional L5 vertebra. Considerable facet arthropathy at multiple levels, particularly L4-5, with 6 mm anterolisthesis at L4-5 and resulting mild right foraminal impingement. Suspected central narrowing of the thecal sac at L4-5 due to facet arthropathy and disc bulge.  Chondrocalcinosis of the pubic symphysis.  Review of the MIP images confirms the above findings.  IMPRESSION: 1. Bilateral acute pulmonary embolus, moderate clot burden, right ventricular to left ventricular ratio within normal limits. 2. Multiple bilateral renal cysts. 3. There also hyperdense renal lesions which are technically indeterminate on today' s CT scan for solid versus cystic lesions. Consider renal protocol MRI with and without contrast in the nonacute setting for further workup, to ensure that none of these represent renal tumors. 4. Considerable splenomegaly, cause uncertain. I do not see other adenopathy in the chest, abdomen, or pelvis. 5. Numerous other findings including atherosclerosis, cardiomegaly, calcified aortic and mitral valves, trace left pleural effusion, hepatic cysts, type 1 pancreas divisum, prominent diverticulosis, small  calcified uterine fibroid, chronically widened sacroiliac joints, lumbar spondylosis and degenerative disc disease causing impingement, chondrocalcinosis of the pubic symphysis, and low-level stranding along the left paracolic gutter and splenic flexure but without definite findings of diverticulitis or other explaining cause. Critical Value/emergent results were called by telephone at the time of interpretation on 11/27/2014 at 7:26 pm to Dr. Roxanne Mins , who verbally acknowledged these results.   Electronically Signed   By: Van Clines M.D.   On: 11/27/2014 16:26   Ct Abdomen Pelvis W Contrast  11/27/2014   CLINICAL DATA:  Left arm pain radiating into the chest. Constipation. Aching and pain with recent left flank pain  EXAM: CT ANGIOGRAPHY CHEST  CT ABDOMEN AND PELVIS WITH CONTRAST  TECHNIQUE: Multidetector CT imaging of the chest was performed using the standard protocol during bolus administration of intravenous contrast. Multiplanar CT image reconstructions and MIPs were obtained to evaluate the vascular anatomy. Multidetector CT imaging of the abdomen and pelvis was performed using the standard protocol during bolus administration of intravenous contrast.  CONTRAST:  181m OMNIPAQUE IOHEXOL 350 MG/ML SOLN  COMPARISON:  Multiple exams, including 11/27/2014; 03/30/2009; 03/22/2013  FINDINGS: Despite efforts by the technologist and patient, motion artifact is present on today's exam and could not be eliminated. This reduces exam sensitivity and specificity.  CT CHEST FINDINGS  Mediastinum/Nodes: Bilateral acute pulmonary embolus extending in the right pulmonary artery, right upper lobe pulmonary artery, right lower lobe pulmonary artery, segmental branches in the left upper lobe, and subsegmental branches in the left lower lobe. RV:LV ratio 0.78, within normal limits.  Coronary, aortic arch, and branch vessel atherosclerotic vascular disease. Moderate cardiomegaly.  Calcification of the aortic and mitral  valves.  Lungs/Pleura: Trace left pleural effusion. Mild biapical pleural parenchymal scarring. Subsegmental atelectasis or scarring along both hemidiaphragms.  Musculoskeletal:  Old healed left posterior rib fractures.  CT ABDOMEN PELVIS FINDINGS  Hepatobiliary: Bilobed 1.0 by 0.8 cm fluid density lesion anteriorly in segment 8 of the liver, similar to 2010. Other fluid density lesions are present in the liver including a 1.9 by 1.2 cm cyst in segment 3. Some of the lesions are technically too small to characterize although statistically likely to be cysts.  Pancreas: Dorsal pancreatic duct 2.5 mm, type 1 pancreas divisum.  Spleen: Splenomegaly observed, splenic volume 911 cc. Small accessory spleen.  Adrenals/Urinary Tract: Scattered Bosniak category 1 and category 2 cysts are present in the kidneys, some too small to characterize. AA 4.2 by 3.3 cm exophytic cystic lesion from the left kidney lower pole has faint calcification along its posterior margin, images 44-46 series 10. Some of the lesions are somewhat hyperdense, including a 1.0 cm lesion on image 40 of series 11 which is subject to volume averaging making it difficult to compare portal venous and delayed phase images, and accordingly a small renal mass is difficult to confidently exclude. Several other mildly hyperdense lesions are similarly probably complex cysts but technically nonspecific.  Stomach/Bowel: Prominent sigmoid colon diverticulosis with scattered diverticula in the remainder the colon. No dilated bowel. New there is some mild stranding along the left paracolic gutter and splenic flexure but without definite findings of acute diverticulitis.  Vascular/Lymphatic: Aortoiliac atherosclerotic vascular disease.  Reproductive: Calcification posteriorly in the myometrium, likely a small fibroid, and only measuring 6 mm in diameter.  Other: No supplemental non-categorized findings.  Musculoskeletal: Chronic widening of the sacroiliac joints.  Transitional L5 vertebra. Considerable facet arthropathy at multiple levels, particularly L4-5, with 6 mm anterolisthesis at L4-5 and resulting mild right foraminal impingement. Suspected central narrowing of the thecal sac at L4-5 due to facet arthropathy and disc bulge.  Chondrocalcinosis of the pubic symphysis.  Review of the MIP images confirms the above findings.  IMPRESSION: 1. Bilateral acute pulmonary embolus, moderate clot burden, right ventricular to left ventricular ratio within normal limits. 2. Multiple bilateral renal cysts. 3. There also hyperdense renal lesions which are technically indeterminate on today' s CT scan for solid versus cystic lesions. Consider renal protocol MRI with and without contrast in the nonacute setting for further workup, to ensure that none of these represent renal tumors. 4. Considerable splenomegaly, cause uncertain. I do not see other adenopathy in the chest, abdomen, or pelvis. 5. Numerous other findings including atherosclerosis, cardiomegaly, calcified aortic and mitral valves, trace left pleural effusion, hepatic cysts, type 1 pancreas divisum, prominent diverticulosis, small calcified uterine fibroid, chronically widened sacroiliac joints, lumbar spondylosis and degenerative disc disease causing impingement, chondrocalcinosis of the pubic symphysis, and low-level stranding along the left paracolic gutter and splenic flexure but without definite findings of diverticulitis or other explaining cause. Critical Value/emergent results were called by telephone at the time of interpretation on 11/27/2014 at 4:85 pm to Dr. Roxanne Mins , who verbally acknowledged these results.   Electronically Signed   By: Van Clines M.D.   On: 11/27/2014 16:26   Dg Shoulder Left  11/27/2014   CLINICAL DATA:  Persistent chronic left shoulder pain for 1 month. Initial encounter.  EXAM: LEFT SHOULDER - 2+ VIEW  COMPARISON:  None.  FINDINGS: There is no evidence of fracture or dislocation. The  left humeral head is seated within the glenoid fossa. The acromioclavicular joint is unremarkable in appearance. No significant soft tissue abnormalities are seen. The visualized portions of the left lung are clear.  IMPRESSION: No evidence of  fracture or dislocation.   Electronically Signed   By: Garald Balding M.D.   On: 11/27/2014 20:41               Blood pressure 157/58, pulse 63, temperature 98.1 F (36.7 C), temperature source Oral, resp. rate 18, height _0  (1.6 m), weight 55.52 kg (122 lb 6.4 oz), SpO2 92 %.  Physical exam:   General-- pleasant white female who appears much younger than her stated age ENT-- nonenteric mucous membranes moist Neck-- no lymphadenopathy Heart-- regular rate and rhythm without murmurs or gallops Lungs-- clear Abdomen-- soft and nontender Psych-- alert and oriented and appropriate   Assessment: 1. Subacute G.I. bleed. Probably upper G.I. based on recent dark stools and the fact that she has been taking Aleve on a regular basis. Suspect that she has small ulcers. 2. Recent pulmonary embolus documented on chest CT. Patient clearly needs to be on anticoagulation 3. Polycythemia vera 4. History of heart block with pacemaker  Plan: we will go ahead and plan EGD tomorrow. I will empirically start her on IV Protonix BID. Agree withholding Eliquis until we have identified her source of bleeding. Have discussed all this with the patient and her family.  Nickola Lenig JR,Wally Shevchenko L 11/29/2014, 3:35 PM   Pager: [phone] If no answer or after hours call [phone]

## 2014-11-30 NOTE — Transfer of Care (Signed)
Immediate Anesthesia Transfer of Care Note  Patient: [name]  Procedure(s) Performed: Procedure(s): ESOPHAGOGASTRODUODENOSCOPY (EGD) (N/A)  Patient Location: PACU and Endoscopy Unit  Anesthesia Type:MAC  Level of Consciousness: awake, alert , oriented and patient cooperative  Airway & Oxygen Therapy: Patient Spontanous Breathing and Patient connected to nasal cannula oxygen  Post-op Assessment: Report given to RN, Post -op Vital signs reviewed and stable and Patient moving all extremities X 4  Post vital signs: Reviewed and stable  Last Vitals:  Filed Vitals:   11/30/14 1357  BP: 136/25  Pulse: 73  Temp: 36.7 C  Resp: 19    Complications: No apparent anesthesia complications

## 2014-12-01 ENCOUNTER — Encounter (HOSPITAL_COMMUNITY): Payer: Self-pay | Admitting: Gastroenterology

## 2014-12-01 LAB — CBC
HEMATOCRIT: 41.8 % (ref 36.0–46.0)
Hemoglobin: 12.4 g/dL (ref 12.0–15.0)
MCH: 19.4 pg — ABNORMAL LOW (ref 26.0–34.0)
MCHC: 29.7 g/dL — AB (ref 30.0–36.0)
MCV: 65.4 fL — ABNORMAL LOW (ref 78.0–100.0)
Platelets: 188 10*3/uL (ref 150–400)
RBC: 6.39 MIL/uL — ABNORMAL HIGH (ref 3.87–5.11)
RDW: 24.5 % — AB (ref 11.5–15.5)
WBC: 22.8 10*3/uL — ABNORMAL HIGH (ref 4.0–10.5)

## 2014-12-05 ENCOUNTER — Encounter (HOSPITAL_COMMUNITY): Payer: Self-pay | Admitting: Emergency Medicine

## 2014-12-05 ENCOUNTER — Emergency Department (HOSPITAL_COMMUNITY): Payer: Medicare Other

## 2014-12-05 ENCOUNTER — Observation Stay (HOSPITAL_COMMUNITY)
Admission: EM | Admit: 2014-12-05 | Discharge: 2014-12-07 | Disposition: A | Discharge status: Home / Self Care | Payer: Medicare Other | Attending: Internal Medicine | Admitting: Internal Medicine

## 2014-12-05 DIAGNOSIS — N2889 Other specified disorders of kidney and ureter: Secondary | ICD-10-CM | POA: Diagnosis not present

## 2014-12-05 DIAGNOSIS — I2699 Other pulmonary embolism without acute cor pulmonale: Secondary | ICD-10-CM | POA: Diagnosis not present

## 2014-12-05 DIAGNOSIS — R55 Syncope and collapse: Secondary | ICD-10-CM | POA: Insufficient documentation

## 2014-12-05 DIAGNOSIS — I517 Cardiomegaly: Secondary | ICD-10-CM | POA: Insufficient documentation

## 2014-12-05 DIAGNOSIS — Z86711 Personal history of pulmonary embolism: Secondary | ICD-10-CM | POA: Diagnosis not present

## 2014-12-05 DIAGNOSIS — R42 Dizziness and giddiness: Secondary | ICD-10-CM | POA: Diagnosis not present

## 2014-12-05 DIAGNOSIS — R0600 Dyspnea, unspecified: Secondary | ICD-10-CM

## 2014-12-05 DIAGNOSIS — J988 Other specified respiratory disorders: Secondary | ICD-10-CM | POA: Insufficient documentation

## 2014-12-05 DIAGNOSIS — Z950 Presence of cardiac pacemaker: Secondary | ICD-10-CM

## 2014-12-05 DIAGNOSIS — E780 Pure hypercholesterolemia: Secondary | ICD-10-CM | POA: Diagnosis not present

## 2014-12-05 DIAGNOSIS — Z91048 Other nonmedicinal substance allergy status: Secondary | ICD-10-CM | POA: Diagnosis not present

## 2014-12-05 DIAGNOSIS — R29818 Other symptoms and signs involving the nervous system: Secondary | ICD-10-CM | POA: Diagnosis not present

## 2014-12-05 DIAGNOSIS — R0602 Shortness of breath: Principal | ICD-10-CM | POA: Insufficient documentation

## 2014-12-05 DIAGNOSIS — M4806 Spinal stenosis, lumbar region: Secondary | ICD-10-CM | POA: Diagnosis not present

## 2014-12-05 DIAGNOSIS — I10 Essential (primary) hypertension: Secondary | ICD-10-CM | POA: Diagnosis not present

## 2014-12-05 DIAGNOSIS — M4316 Spondylolisthesis, lumbar region: Secondary | ICD-10-CM | POA: Diagnosis not present

## 2014-12-05 DIAGNOSIS — R2689 Other abnormalities of gait and mobility: Secondary | ICD-10-CM | POA: Diagnosis present

## 2014-12-05 DIAGNOSIS — R1032 Left lower quadrant pain: Secondary | ICD-10-CM | POA: Diagnosis not present

## 2014-12-05 DIAGNOSIS — R918 Other nonspecific abnormal finding of lung field: Secondary | ICD-10-CM | POA: Insufficient documentation

## 2014-12-05 DIAGNOSIS — M7989 Other specified soft tissue disorders: Secondary | ICD-10-CM | POA: Insufficient documentation

## 2014-12-05 DIAGNOSIS — D45 Polycythemia vera: Secondary | ICD-10-CM | POA: Insufficient documentation

## 2014-12-05 DIAGNOSIS — K5730 Diverticulosis of large intestine without perforation or abscess without bleeding: Secondary | ICD-10-CM | POA: Insufficient documentation

## 2014-12-05 DIAGNOSIS — Z91018 Allergy to other foods: Secondary | ICD-10-CM | POA: Insufficient documentation

## 2014-12-05 DIAGNOSIS — R109 Unspecified abdominal pain: Secondary | ICD-10-CM | POA: Diagnosis present

## 2014-12-05 DIAGNOSIS — J9811 Atelectasis: Secondary | ICD-10-CM | POA: Insufficient documentation

## 2014-12-05 DIAGNOSIS — Z8673 Personal history of transient ischemic attack (TIA), and cerebral infarction without residual deficits: Secondary | ICD-10-CM | POA: Diagnosis not present

## 2014-12-05 DIAGNOSIS — R161 Splenomegaly, not elsewhere classified: Secondary | ICD-10-CM | POA: Insufficient documentation

## 2014-12-05 DIAGNOSIS — N281 Cyst of kidney, acquired: Secondary | ICD-10-CM | POA: Insufficient documentation

## 2014-12-05 DIAGNOSIS — R51 Headache: Secondary | ICD-10-CM | POA: Diagnosis not present

## 2014-12-05 DIAGNOSIS — I442 Atrioventricular block, complete: Secondary | ICD-10-CM | POA: Insufficient documentation

## 2014-12-05 HISTORY — DX: Other pulmonary embolism without acute cor pulmonale: I26.99

## 2014-12-05 LAB — BASIC METABOLIC PANEL
Anion gap: 8 (ref 5–15)
BUN: 18 mg/dL (ref 6–20)
CO2: 26 mmol/L (ref 22–32)
Calcium: 8.8 mg/dL — ABNORMAL LOW (ref 8.9–10.3)
Chloride: 106 mmol/L (ref 101–111)
Creatinine, Ser: 0.8 mg/dL (ref 0.44–1.00)
GLUCOSE: 100 mg/dL — AB (ref 65–99)
Potassium: 4.3 mmol/L (ref 3.5–5.1)
Sodium: 140 mmol/L (ref 135–145)

## 2014-12-05 LAB — CBC
HEMATOCRIT: 42.3 % (ref 36.0–46.0)
Hemoglobin: 12.7 g/dL (ref 12.0–15.0)
MCH: 19.2 pg — ABNORMAL LOW (ref 26.0–34.0)
MCHC: 30 g/dL (ref 30.0–36.0)
MCV: 63.8 fL — ABNORMAL LOW (ref 78.0–100.0)
Platelets: 233 10*3/uL (ref 150–400)
RBC: 6.63 MIL/uL — ABNORMAL HIGH (ref 3.87–5.11)
RDW: 24.2 % — AB (ref 11.5–15.5)
WBC: 23.6 10*3/uL — ABNORMAL HIGH (ref 4.0–10.5)

## 2014-12-05 LAB — I-STAT TROPONIN, ED: TROPONIN I, POC: 0 ng/mL (ref 0.00–0.08)

## 2014-12-05 LAB — BRAIN NATRIURETIC PEPTIDE: B NATRIURETIC PEPTIDE 5: 214.4 pg/mL — AB (ref 0.0–100.0)

## 2014-12-05 LAB — PROTIME-INR
INR: 2.57 — AB (ref 0.00–1.49)
Prothrombin Time: 27.2 seconds — ABNORMAL HIGH (ref 11.6–15.2)

## 2014-12-05 LAB — GLUCOSE, CAPILLARY: Glucose-Capillary: 146 mg/dL — ABNORMAL HIGH (ref 65–99)

## 2014-12-05 MED ORDER — IOHEXOL 350 MG/ML SOLN
80.0000 mL | Freq: Once | INTRAVENOUS | Status: AC | PRN
  Administered 2014-12-05: 80 mL via INTRAVENOUS

## 2014-12-05 MED ORDER — IOHEXOL 300 MG/ML  SOLN
30.0000 mL | Freq: Once | INTRAMUSCULAR | Status: AC | PRN
  Administered 2014-12-05: 25 mL via ORAL

## 2014-12-05 MED ORDER — PANTOPRAZOLE SODIUM 40 MG PO TBEC
40.0000 mg | DELAYED_RELEASE_TABLET | Freq: Every day | ORAL | Status: DC
Start: 2014-12-06 — End: 2014-12-07
  Administered 2014-12-06 – 2014-12-07 (×2): 40 mg via ORAL
  Filled 2014-12-05 (×2): qty 1

## 2014-12-05 MED ORDER — SODIUM CHLORIDE 0.9 % IJ SOLN: 3.0000 mL | INTRAMUSCULAR | Status: DC | PRN

## 2014-12-05 MED ORDER — AMLODIPINE BESYLATE 5 MG PO TABS
5.0000 mg | ORAL_TABLET | Freq: Every day | ORAL | Status: DC
  Administered 2014-12-06 – 2014-12-07 (×2): 5 mg via ORAL
  Filled 2014-12-05 (×2): qty 1

## 2014-12-05 MED ORDER — SIMVASTATIN 10 MG PO TABS
10.0000 mg | ORAL_TABLET | Freq: Every evening | ORAL | Status: DC
  Administered 2014-12-05 – 2014-12-06 (×2): 10 mg via ORAL
  Filled 2014-12-05 (×3): qty 1

## 2014-12-05 MED ORDER — CALCIUM CARBONATE-VITAMIN D 500-200 MG-UNIT PO TABS
2.0000 | ORAL_TABLET | Freq: Every day | ORAL | Status: DC
  Administered 2014-12-06 – 2014-12-07 (×2): 2 via ORAL
  Filled 2014-12-05 (×2): qty 2

## 2014-12-05 MED ORDER — VITAMIN B-6 100 MG PO TABS
100.0000 mg | ORAL_TABLET | Freq: Every day | ORAL | Status: DC
  Administered 2014-12-06 – 2014-12-07 (×2): 100 mg via ORAL
  Filled 2014-12-05 (×2): qty 1

## 2014-12-05 MED ORDER — POLYVINYL ALCOHOL 1.4 % OP SOLN
Freq: Two times a day (BID) | OPHTHALMIC | Status: DC
  Administered 2014-12-05: 21:00:00 via OPHTHALMIC
  Administered 2014-12-06: 1 [drp] via OPHTHALMIC
  Administered 2014-12-06 – 2014-12-07 (×2): via OPHTHALMIC
  Filled 2014-12-05: qty 15

## 2014-12-05 MED ORDER — SODIUM CHLORIDE 0.9 % IV SOLN: 250.0000 mL | INTRAVENOUS | Status: DC | PRN

## 2014-12-05 MED ORDER — SODIUM CHLORIDE 0.9 % IJ SOLN
3.0000 mL | Freq: Two times a day (BID) | INTRAMUSCULAR | Status: DC
  Administered 2014-12-05 – 2014-12-06 (×3): 3 mL via INTRAVENOUS

## 2014-12-05 MED ORDER — ACETAMINOPHEN 325 MG PO TABS: 650.0000 mg | ORAL_TABLET | ORAL | Status: DC | PRN

## 2014-12-05 MED ORDER — OCUVITE PO TABS
1.0000 | ORAL_TABLET | Freq: Every day | ORAL | Status: DC
  Administered 2014-12-06 – 2014-12-07 (×2): 1 via ORAL
  Filled 2014-12-05 (×2): qty 1

## 2014-12-05 MED ORDER — APIXABAN 5 MG PO TABS
5.0000 mg | ORAL_TABLET | Freq: Two times a day (BID) | ORAL | Status: DC
  Administered 2014-12-05 – 2014-12-07 (×4): 5 mg via ORAL
  Filled 2014-12-05 (×5): qty 1

## 2014-12-05 MED ORDER — STROKE: EARLY STAGES OF RECOVERY BOOK
Freq: Once | Status: AC
  Administered 2014-12-05: 21:00:00
  Filled 2014-12-05: qty 1

## 2014-12-05 NOTE — H&P (Signed)
[facility] History and Physical  [name] ZJQ:[id number] DOB: 1922-12-10 DOA: 12/05/2014  Referring physician: Evelina Bucy MD PCP: Vidal Schwalbe, MD   Chief Complaint: Shortness of breath, abdominal pain, balance loss.  HPI: [name] is a pleasant 81 y.o. female from home alone, with PMH significant for HTN, hypercholesterolemia, syncope, complete heart block, stroke, pulmonary emboli and polycythemia vera who presents to the ED with worsening SOB.  She was released from the hospital on 11/30/2014 after being treated for bilateral pulmonary embolism.  She states that since her discharge she has become progressively more short of breath. She is not hypoxic in the ER.    The patient's son complains that his mother has been holding the walls as she ambulates for the past two days.  She normally walks an hour a day, independently without a cane or walker.  The patient states that she has been intermittently dizzy for awhile particularly in the morning.   When waking this morning she said she was dizzy, had a bit of a headache and was nauseous. She believes the Anaprox is what made her dizzy. She also complains of a 2 week duration L flank/back pain that radiates to her anterior groin.  In the ED her blood pressure ranged from 146/55-170/50, O2 sat 93-97%, venous doppler study was negative for evidence of DVT.  CT abdomen/pelvis shows numerous bilateral renal cysts vs tumor and chronic splenomegaly.  Repeat CT Angio chest shows decreased clot burden.   Review of Systems:  Constitutional: No weight loss, night sweats, fevers, chills. HEENT: Occassional headaches. No difficulty swallowing, nasal congestion, post nasal drip. Cardio-vascular: Minor LLE  Swelling. No chest pain, Orthopnea, PND, anasarca, dizziness, palpitations.  GI: No heartburn, indigestion, abdominal pain, nausea, vomiting, diarrhea, change in bowel habits, loss of appetite  Resp: Shortness of breath with  exertion. No cough, hemoptysis, mucus production, wheezing.No chest wall deformity  Skin: no rash or lesions.  GU: Left-sided flank pain that radiates to ipsilateral anterior groin. No dysuria, change in color of urine, no urgency or frequency. Musculoskeletal: No joint pain or swelling. No decreased range of motion. No back pain.  Psych: No change in mood or affect. Mildly anxious at times. No depression or memory loss.   Past Medical History  Diagnosis Date  . Complete heart block   . Hypercholesterolemia   . Polycythemia vera(238.4)   . Syncope   . Hypertension   . Stroke   . Pulmonary emboli    Past Surgical History  Procedure Laterality Date  . Pacemaker insertion  03/26/09    MDT Adapta DR, revised for RV lead fracture by Dr Doreatha Lew  . Hand surgery      S/P Carpal tunnel repair  . Appendectomy    . Transthoracic echocardiogram  03/26/2009  . US echocardiography  03/01/2009    EF 55-60%  . Esophagogastroduodenoscopy N/A 11/30/2014    Procedure: ESOPHAGOGASTRODUODENOSCOPY (EGD);  Surgeon: Laurence Spates, MD;  Location: [facility] ENDOSCOPY;  Service: Endoscopy;  Laterality: N/A;   Social History:  reports that she has never smoked. She has never used smokeless tobacco. She reports that she does not drink alcohol or use illicit drugs.  Lives at home alone.  Independent with ADLs. Checked on by Son.  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  Allergies  Allergen Reactions  . Chocolate Other (See Comments)    migraine  . Pollen Extract     sneezing    Family History  Problem Relation Age of  Onset  . Adopted: Yes  . Migraines Son   . Heart disease Son     CABGx5  . Stroke Son 56  . Canavan disease Son     prostate  . Pulmonary embolism Daughter     x2 (age 64 & 16)  . Deep vein thrombosis Daughter   . Asthma Daughter   . Migraines Daughter   . Depression Daughter   . Other Daughter     Acid reflux    Prior to Admission medications   Medication Sig Start Date End Date  Taking? Authorizing Provider  amLODipine (NORVASC) 5 MG tablet Take 5 mg by mouth daily.     Yes Historical Provider, MD  apixaban (ELIQUIS) 5 MG TABS tablet Take 1 tablet (5 mg total) by mouth 2 (two) times daily. 12/05/14  Yes Debbe Odea, MD  beta carotene w/minerals (OCUVITE) tablet Take 1 tablet by mouth daily.     Yes Historical Provider, MD  calcium-vitamin D (OSCAL WITH D) 500-200 MG-UNIT per tablet Take 2 tablets by mouth daily.     Yes Historical Provider, MD  hydroxyurea (HYDREA) 500 MG capsule TAKE 1 CAPSULE (500 MG TOTAL) BY MOUTH ONCE A WEEK. MAY TAKE WITH FOOD TO MINIMIZE GI SIDE EFFECTS. 08/18/14  Yes Volanda Napoleon, MD  naproxen sodium (ANAPROX) 220 MG tablet Take 220 mg by mouth 2 (two) times daily.   Yes Historical Provider, MD  pantoprazole (PROTONIX) 40 MG tablet Take 1 tablet (40 mg total) by mouth daily. Switch for any other PPI at similar dose and frequency 11/30/14  Yes Debbe Odea, MD  Polyethyl Glycol-Propyl Glycol (SYSTANE ULTRA OP) Apply 1 drop to eye 2 (two) times daily.    Yes Historical Provider, MD  pyridOXINE (VITAMIN B-6) 100 MG tablet Take 100 mg by mouth daily.   Yes Historical Provider, MD  simvastatin (ZOCOR) 10 MG tablet Take 10 mg by mouth every evening.    Yes Historical Provider, MD  apixaban (ELIQUIS) 5 MG TABS tablet Take 2 tablets (10 mg total) by mouth 2 (two) times daily. Patient not taking: Reported on 12/05/2014 11/30/14   Debbe Odea, MD   Physical Exam: Filed Vitals:   12/05/14 1345 12/05/14 1400 12/05/14 1415 12/05/14 1430  BP: 157/54 155/53 169/68 170/50  Pulse: 70 66 70 73  Temp:      TempSrc:      Resp: 23 21 15 20   SpO2: 94% 94% 95% 96%    Wt Readings from Last 3 Encounters:  11/27/14 55.52 kg (122 lb 6.4 oz)  09/21/14 57.471 kg (126 lb 11.2 oz)  09/18/14 57.335 kg (126 lb 6.4 oz)    General:  Thin pleasant elderly female, appears calm and comfortable.  Son and boyfriend at bedside. Eyes: PERRL, normal lids, irises &  conjunctiva ENT: grossly normal hearing, lips & tongue Neck: no LAD, masses or thyromegaly Cardiovascular: RRR, no m/r/g. Trace LLE edema. Respiratory: min Rhonchi in LLL with no wheeze or rales. Mildly increased respiratory effort when conversing. Abdomen: soft, L flank tender to palpation, no masses, +bs Skin: no rash or induration seen on limited exam Musculoskeletal: grossly normal tone BUE/BLE Psychiatric: grossly normal mood and affect, speech fluent and appropriate Neurologic: grossly non-focal.          Labs on Admission:  Basic Metabolic Panel:  Recent Labs Lab 11/29/14 0437 12/05/14 1100  NA 141 140  K 3.6 4.3  CL 106 106  CO2 25 26  GLUCOSE 66 100*  BUN 18  18  CREATININE 0.78 0.80  CALCIUM 8.9 8.8*   CBC:  Recent Labs Lab 11/29/14 0437 11/30/14 0435 12/01/14 0330 12/05/14 1100  WBC 21.6* 21.8* 22.8* 23.6*  HGB 12.5 12.1 12.4 12.7  HCT 41.8 41.0 41.8 42.3  MCV 64.1* 64.1* 65.4* 63.8*  PLT 169 183 188 233    BNP (last 3 results)  Recent Labs  12/05/14 1100  BNP 214.4*   Radiological Exams on Admission: Ct Angio Chest Pe W/cm &/or Wo Cm  12/05/2014   CLINICAL DATA:  Abdominal pain radiating to the left hip and groin. Known blood clots. Left lower extremity swelling.  EXAM: CT ANGIOGRAPHY CHEST  CT ABDOMEN AND PELVIS WITH CONTRAST  TECHNIQUE: Multidetector CT imaging of the chest was performed using the standard protocol during bolus administration of intravenous contrast. Multiplanar CT image reconstructions and MIPs were obtained to evaluate the vascular anatomy. Multidetector CT imaging of the abdomen and pelvis was performed using the standard protocol during bolus administration of intravenous contrast.  CONTRAST:  57mL OMNIPAQUE IOHEXOL 300 MG/ML SOLN, 22mL OMNIPAQUE IOHEXOL 350 MG/ML SOLN  COMPARISON:  11/27/2014 chest CT  FINDINGS: CTA CHEST FINDINGS  THORACIC INLET/BODY WALL:  Dual-chamber pacer from the right in unremarkable position.   MEDIASTINUM:  Significant decrease in the bilateral pulmonary emboli, with nonocclusive clot now seen in the posterior segment right upper lobe segmental arteries and basilar subsegmental vessels to the right lower lobe. There is no evidence of right heart strain.  No acute aortic findings.  Chronic cardiomegaly.  No pericardial effusion.  LUNG WINDOWS:  Mild dependent atelectasis, greater in the left lower lobe. Trace bilateral pleural effusion. No evidence of edema or pneumonia.  Small pulmonary nodules, including 3 mm nodule in the right apical lung on image 48, too small for imaging follow-up recommendations in this never smoker.  OSSEOUS:  Remote healed left posterior rib fractures.  No acute findings.  CT ABDOMEN and PELVIS FINDINGS  BODY WALL: No contributory findings.  Liver: Numerous circumscribed low-density hepatic lesions consistent with cysts, largest in segment 3 measuring 18 mm.  Biliary: No evidence of biliary obstruction or stone.  Pancreas: Pancreas divisum. No evidence of acute or remote inflammation.  Spleen: Splenomegaly with 14 cm AP dimension and mild heterogeneous enhancement. This is presumably related to the patient's history of polycythemia.  Adrenals: Unremarkable.  Kidneys and ureters: Numerous bilateral renal cortical lesions consistent with cysts. Some are complicated, including a 39 mm cyst exophytic from the lower pole right kidney which has benign mural calcification. There are 2 dense renal lesions within the posterior left kidney, both measuring near 1 cm and present on images 41 and 45. Due to small size, these are difficult to further characterize.  No hydronephrosis or stone.  Bladder: Unremarkable.  Reproductive: No pathologic findings.  Bowel: Distal colonic diverticulosis. No active inflammation. No bowel obstruction. Appendectomy. No pericecal inflammatory change.  Retroperitoneum: No mass or adenopathy.  Peritoneum: Trace pelvic fluid which could be from volume overload.   Vascular: No acute abnormality.  OSSEOUS: No acute abnormalities.  Advanced lower lumbar facet arthritis with L4-5 grade 1 anterolisthesis. Facet overgrowth causes advanced canal stenosis at L3-4.  Review of the MIP images confirms the above findings.  IMPRESSION: 1. Decrease in pulmonary artery clot burden compared to 11/27/2014. No evidence of interval pulmonary embolism. 2. No explanation for acute abdominal pain. 3. Bibasilar atelectasis with trace effusion on the left. 4. Chronic splenomegaly, likely from patient's history of polycythemia vera. 5. Numerous bilateral renal  cysts. There are also 2 approximately 10 mm complex cysts or solid tumors within the left kidney. Further evaluation would require enhanced MRI, as noted on CT 11/27/2014. 6. Other incidental findings are noted above, and stable from prior   Electronically Signed   By: Monte Fantasia M.D.   On: 12/05/2014 13:02   Ct Abdomen Pelvis W Contrast  12/05/2014   CLINICAL DATA:  Abdominal pain radiating to the left hip and groin. Known blood clots. Left lower extremity swelling.  EXAM: CT ANGIOGRAPHY CHEST  CT ABDOMEN AND PELVIS WITH CONTRAST  TECHNIQUE: Multidetector CT imaging of the chest was performed using the standard protocol during bolus administration of intravenous contrast. Multiplanar CT image reconstructions and MIPs were obtained to evaluate the vascular anatomy. Multidetector CT imaging of the abdomen and pelvis was performed using the standard protocol during bolus administration of intravenous contrast.  CONTRAST:  23mL OMNIPAQUE IOHEXOL 300 MG/ML SOLN, 74mL OMNIPAQUE IOHEXOL 350 MG/ML SOLN  COMPARISON:  11/27/2014 chest CT  FINDINGS: CTA CHEST FINDINGS  THORACIC INLET/BODY WALL:  Dual-chamber pacer from the right in unremarkable position.  MEDIASTINUM:  Significant decrease in the bilateral pulmonary emboli, with nonocclusive clot now seen in the posterior segment right upper lobe segmental arteries and basilar subsegmental  vessels to the right lower lobe. There is no evidence of right heart strain.  No acute aortic findings.  Chronic cardiomegaly.  No pericardial effusion.  LUNG WINDOWS:  Mild dependent atelectasis, greater in the left lower lobe. Trace bilateral pleural effusion. No evidence of edema or pneumonia.  Small pulmonary nodules, including 3 mm nodule in the right apical lung on image 48, too small for imaging follow-up recommendations in this never smoker.  OSSEOUS:  Remote healed left posterior rib fractures.  No acute findings.  CT ABDOMEN and PELVIS FINDINGS  BODY WALL: No contributory findings.  Liver: Numerous circumscribed low-density hepatic lesions consistent with cysts, largest in segment 3 measuring 18 mm.  Biliary: No evidence of biliary obstruction or stone.  Pancreas: Pancreas divisum. No evidence of acute or remote inflammation.  Spleen: Splenomegaly with 14 cm AP dimension and mild heterogeneous enhancement. This is presumably related to the patient's history of polycythemia.  Adrenals: Unremarkable.  Kidneys and ureters: Numerous bilateral renal cortical lesions consistent with cysts. Some are complicated, including a 39 mm cyst exophytic from the lower pole right kidney which has benign mural calcification. There are 2 dense renal lesions within the posterior left kidney, both measuring near 1 cm and present on images 41 and 45. Due to small size, these are difficult to further characterize.  No hydronephrosis or stone.  Bladder: Unremarkable.  Reproductive: No pathologic findings.  Bowel: Distal colonic diverticulosis. No active inflammation. No bowel obstruction. Appendectomy. No pericecal inflammatory change.  Retroperitoneum: No mass or adenopathy.  Peritoneum: Trace pelvic fluid which could be from volume overload.  Vascular: No acute abnormality.  OSSEOUS: No acute abnormalities.  Advanced lower lumbar facet arthritis with L4-5 grade 1 anterolisthesis. Facet overgrowth causes advanced canal stenosis  at L3-4.  Review of the MIP images confirms the above findings.  IMPRESSION: 1. Decrease in pulmonary artery clot burden compared to 11/27/2014. No evidence of interval pulmonary embolism. 2. No explanation for acute abdominal pain. 3. Bibasilar atelectasis with trace effusion on the left. 4. Chronic splenomegaly, likely from patient's history of polycythemia vera. 5. Numerous bilateral renal cysts. There are also 2 approximately 10 mm complex cysts or solid tumors within the left kidney. Further evaluation would require enhanced  MRI, as noted on CT 11/27/2014. 6. Other incidental findings are noted above, and stable from prior   Electronically Signed   By: Monte Fantasia M.D.   On: 12/05/2014 13:02    EKG: Independently reviewed. NSR, pacemaker spikes noted, prolonged PR interval, no ST-T wave changes.  Assessment/Plan Principal Problem:   Balance problems Active Problems:   DYSPNEA   PACEMAKER   Acute pulmonary embolus   Polycythemia rubra vera   Dyspnea   Left flank pain  Acute Bilateral Pulmonary Emboli with Dyspnea Patient describes being more short of breath for the past two days She is 97% O2 sat on room air Continue Eliquis Question whether this may be due to anxiety.  Provide reassurance.  Dizziness  Unstable walking yesterday and today, denies falls Obtain orthostatics Consult PT If symptoms persist, could consider further work up.    Left Flank Pain Describes pain as radiating from L lower back around to groin and occasionally "grabs her in the chest" Question whether or not this could be due to renal cysts/tumors or splenomegaly Discussed with Dr. Pascal Lux of radiology who did not feel that the renal lesions were causing her pain. Check UA.  BUN/Creatinine are in normal limits. Urology consulted.  Family requests further work up.  Polycythemia Rubra Vera Likely the cause of PE and splenomegaly.   CBC remains stable from previous visits Continue monitoring  Complete  Heart Block s/p Pacemaker Has had pacemaker in place since 2011 without reported complications   Code Status: DNR DVT Prophylaxis: Apixaban Family Communication: Son at bedside Disposition Plan: D/C in 24 - 48 hours.   Time spent: 45 minutes  [place], Maryann Alar [facility] Pager [phone]

## 2014-12-05 NOTE — ED Notes (Signed)
Pt c/o swelling in left leg and foot and pain in left rib area with SOB more today; pt discharged on Friday after having PE and GI bleed

## 2014-12-05 NOTE — ED Notes (Signed)
Pt given dinner tray.

## 2014-12-05 NOTE — ED Notes (Addendum)
Pt daughter reports the pt believes her SOB is coming from her Eliquis.

## 2014-12-05 NOTE — Progress Notes (Signed)
VASCULAR LAB PRELIMINARY  PRELIMINARY  PRELIMINARY  PRELIMINARY  Left lower extremity venous duplex completed.    Preliminary report:  Left:  No evidence of DVT, superficial thrombosis, or Baker's cyst.  Latonda Larrivee, RVT 12/05/2014, 11:47 AM

## 2014-12-05 NOTE — Consult Note (Signed)
I have been asked to see the patient by Dr. Domenic Polite, for evaluation and management of right renal mass.  History of present illness: Very sweet 81F who underwent a CT scan for persistent LLQ pain which has been present for the past 2.5 weeks.  The CT scan was largely unremarkable as it relates to the patient's abdominal pain. However, there was some findings on her kidney that were unusual. The patient was found to have complex cysts bilaterally. There were 2 areas on the right kidney in the lower pole measuring approximate 10 mm each that were hyperdense and difficult to ascertain whether these were enhancing renal masses or hyperdense cyst. There was no hydronephrosis or obstruction of the kidneys bilaterally.  The patient has no history of GU malignancy. She has no history of kidney stones or recurrent urinary tract infections. She's had no GU surgeries.  The patient's abdominal pain is crampy in nature, exacerbated by movement, and radiating from the anterior iliac spine down the inguinal crease. She is not complaining of labial pain. She often has pain that "grabs" her in the epigastric region. She is having normal bowel movements. Denies any diarrhea or constipation. She denies any significant nausea or vomiting. Her appetite is good her weight is stable.  Review of systems: A 12 point comprehensive review of systems was obtained and is negative unless otherwise stated in the history of present illness.  Patient Active Problem List   Diagnosis Date Noted  . Dyspnea 12/05/2014  . Balance problems 12/05/2014  . Left flank pain 12/05/2014  . GI bleed 11/29/2014  . Acute pulmonary embolus 11/27/2014  . Polycythemia rubra vera 11/27/2014  . Leukocytosis 11/27/2014  . Chest pain   . Essential hypertension 09/18/2014  . Mobitz type II atrioventricular block 06/30/2011  . Syncope 06/30/2011  . Polycythemia 05/21/2011  . HYPERTENSION, UNSPECIFIED 07/12/2010  . DYSPNEA 07/12/2010  .  PACEMAKER 07/12/2010    No current facility-administered medications on file prior to encounter.   Current Outpatient Prescriptions on File Prior to Encounter  Medication Sig Dispense Refill  . amLODipine (NORVASC) 5 MG tablet Take 5 mg by mouth daily.      Marland Kitchen apixaban (ELIQUIS) 5 MG TABS tablet Take 1 tablet (5 mg total) by mouth 2 (two) times daily. 60 tablet 0  . beta carotene w/minerals (OCUVITE) tablet Take 1 tablet by mouth daily.      . calcium-vitamin D (OSCAL WITH D) 500-200 MG-UNIT per tablet Take 2 tablets by mouth daily.      . hydroxyurea (HYDREA) 500 MG capsule TAKE 1 CAPSULE (500 MG TOTAL) BY MOUTH ONCE A WEEK. MAY TAKE WITH FOOD TO MINIMIZE GI SIDE EFFECTS. 10 capsule 3  . pantoprazole (PROTONIX) 40 MG tablet Take 1 tablet (40 mg total) by mouth daily. Switch for any other PPI at similar dose and frequency 30 tablet 3  . Polyethyl Glycol-Propyl Glycol (SYSTANE ULTRA OP) Apply 1 drop to eye 2 (two) times daily.     Marland Kitchen pyridOXINE (VITAMIN B-6) 100 MG tablet Take 100 mg by mouth daily.    . simvastatin (ZOCOR) 10 MG tablet Take 10 mg by mouth every evening.     Marland Kitchen apixaban (ELIQUIS) 5 MG TABS tablet Take 2 tablets (10 mg total) by mouth 2 (two) times daily. (Patient not taking: Reported on 12/05/2014) 10 tablet 0    Past Medical History  Diagnosis Date  . Complete heart block   . Hypercholesterolemia   . Polycythemia vera(238.4)   .  Syncope   . Hypertension   . Stroke   . Pulmonary emboli     Past Surgical History  Procedure Laterality Date  . Pacemaker insertion  03/26/09    MDT Adapta DR, revised for RV lead fracture by Dr Doreatha Lew  . Hand surgery      S/P Carpal tunnel repair  . Appendectomy    . Transthoracic echocardiogram  03/26/2009  . US echocardiography  03/01/2009    EF 55-60%  . Esophagogastroduodenoscopy N/A 11/30/2014    Procedure: ESOPHAGOGASTRODUODENOSCOPY (EGD);  Surgeon: Laurence Spates, MD;  Location: [facility] ENDOSCOPY;  Service: Endoscopy;  Laterality: N/A;     History  Substance Use Topics  . Smoking status: Never Smoker   . Smokeless tobacco: Never Used     Comment: never smoked  . Alcohol Use: No    Family History  Problem Relation Age of Onset  . Adopted: Yes  . Migraines Son   . Heart disease Son     CABGx5  . Stroke Son 40  . Canavan disease Son     prostate  . Pulmonary embolism Daughter     x2 (age 86 & 63)  . Deep vein thrombosis Daughter   . Asthma Daughter   . Migraines Daughter   . Depression Daughter   . Other Daughter     Acid reflux    PE: Filed Vitals:   12/05/14 1345 12/05/14 1400 12/05/14 1415 12/05/14 1430  BP: 157/54 155/53 169/68 170/50  Pulse: 70 66 70 73  Temp:      TempSrc:      Resp: 23 21 15 20   SpO2: 94% 94% 95% 96%   Patient appears to be in no acute distress  patient is alert and oriented x3 Atraumatic normocephalic head No cervical or supraclavicular lymphadenopathy appreciated No increased work of breathing, no audible wheezes/rhonchi Regular sinus rhythm/rate Abdomen is soft, she has tenderness to palpation in the left lower quadrant and along the anterior iliac wing removing posteriorly. She has less left CVA tenderness. She has less inguinal tenderness to palpation. She has no right-sided CVA tenderness. Her right side abdominal exam is normal.  Lower extremities are symmetric without appreciable edema Grossly neurologically intact No identifiable skin lesions   Recent Labs  12/05/14 1100  WBC 23.6*  HGB 12.7  HCT 42.3    Recent Labs  12/05/14 1100  NA 140  K 4.3  CL 106  CO2 26  GLUCOSE 100*  BUN 18  CREATININE 0.80  CALCIUM 8.8*    Recent Labs  12/05/14 1100  INR 2.57*   No results for input(s): LABURIN in the last 72 hours. Results for orders placed or performed in visit on 11/01/14  TECHNOLOGIST REVIEW     Status: None   Collection Time: 11/01/14  1:38 PM  Result Value Ref Range Status   Technologist Review Few schistocytes  Final    Imaging:  I  have  independently reviewed the patient's CT scan of her abdomen/pelvis. Impression of her kidneys is as follows: Numerous bilateral renal cysts. There are also 2 approximately 10 mm complex cysts or solid tumors within the left kidney.   Imp:  The patient has 2 very small hyperdense cyst on the posterior aspect of the lower pole of her left kidney. These are poorly characterized given the phase of the CT scan. Unfortunately, the patient is not able to obtain MRI scan or pacemaker. However, these lesions are quite small and pose very little potential for metastases in  the event that they truly were malignant.  Recommendations:  I reassured the patient and her daughter as it relates to her renal findings. I suggested that we follow-up with her in our clinic in 6 months with a repeat CT, a renal mass protocol. I suspect that these are hyperdense cyst which would require no additional follow-up. If they do enhance, active surveillance would be our treatment of choice. I will get the patient scheduled for follow-up in our clinic with a CT scan in 6 months.  Thank you for involving me in this patient's care, Please page with any further questions or concerns. Louis Meckel W

## 2014-12-05 NOTE — ED Provider Notes (Signed)
CSN: [id number]     Arrival date & time 12/05/14  0941 History   First MD Initiated Contact with Patient 12/05/14 [phone]     Chief Complaint  Patient presents with  . Leg Swelling  . Shortness of Breath     (Consider location/radiation/quality/duration/timing/severity/associated sxs/prior Treatment) HPI Comments: Multiple complaints today: SOB, LLE swelling Recently admitted for PE, started on Eliquis. Had CT also that showed some renal lesions. Felt well after leaving the hospital, but has had worsening SOB starting a day after leaving. Mild LLE swelling, noticed a few days ago, not worsening.  Patient is a 81 y.o. female presenting with shortness of breath. The history is provided by the patient.  Shortness of Breath Severity:  Moderate Onset quality:  Gradual Duration:  3 days Timing:  Constant Progression:  Worsening Chronicity:  Recurrent Relieved by:  Nothing Worsened by:  Nothing tried Associated symptoms: abdominal pain (L sided)   Associated symptoms: no chest pain, no cough, no fever and no vomiting     Past Medical History  Diagnosis Date  . Complete heart block   . Hypercholesterolemia   . Polycythemia vera(238.4)   . Syncope   . Hypertension   . Stroke   . Pulmonary emboli    Past Surgical History  Procedure Laterality Date  . Pacemaker insertion  03/26/09    MDT Adapta DR, revised for RV lead fracture by Dr Doreatha Lew  . Hand surgery      S/P Carpal tunnel repair  . Appendectomy    . Transthoracic echocardiogram  03/26/2009  . US echocardiography  03/01/2009    EF 55-60%  . Esophagogastroduodenoscopy N/A 11/30/2014    Procedure: ESOPHAGOGASTRODUODENOSCOPY (EGD);  Surgeon: Laurence Spates, MD;  Location: [facility] ENDOSCOPY;  Service: Endoscopy;  Laterality: N/A;   Family History  Problem Relation Age of Onset  . Adopted: Yes  . Migraines Son   . Heart disease Son     CABGx5  . Stroke Son 40  . Canavan disease Son     prostate  . Pulmonary embolism Daughter      x2 (age 26 & 47)  . Deep vein thrombosis Daughter   . Asthma Daughter   . Migraines Daughter   . Depression Daughter   . Other Daughter     Acid reflux   History  Substance Use Topics  . Smoking status: Never Smoker   . Smokeless tobacco: Never Used     Comment: never smoked  . Alcohol Use: No   OB History    No data available     Review of Systems  Constitutional: Negative for fever.  Respiratory: Positive for shortness of breath. Negative for cough.   Cardiovascular: Negative for chest pain.  Gastrointestinal: Positive for abdominal pain (L sided). Negative for vomiting.  All other systems reviewed and are negative.     Allergies  Chocolate and Pollen extract  Home Medications   Prior to Admission medications   Medication Sig Start Date End Date Taking? Authorizing Provider  amLODipine (NORVASC) 5 MG tablet Take 5 mg by mouth daily.     Yes Historical Provider, MD  apixaban (ELIQUIS) 5 MG TABS tablet Take 1 tablet (5 mg total) by mouth 2 (two) times daily. 12/05/14  Yes Debbe Odea, MD  beta carotene w/minerals (OCUVITE) tablet Take 1 tablet by mouth daily.     Yes Historical Provider, MD  calcium-vitamin D (OSCAL WITH D) 500-200 MG-UNIT per tablet Take 2 tablets by mouth daily.  Yes Historical Provider, MD  hydroxyurea (HYDREA) 500 MG capsule TAKE 1 CAPSULE (500 MG TOTAL) BY MOUTH ONCE A WEEK. MAY TAKE WITH FOOD TO MINIMIZE GI SIDE EFFECTS. 08/18/14  Yes Volanda Napoleon, MD  naproxen sodium (ANAPROX) 220 MG tablet Take 220 mg by mouth 2 (two) times daily.   Yes Historical Provider, MD  pantoprazole (PROTONIX) 40 MG tablet Take 1 tablet (40 mg total) by mouth daily. Switch for any other PPI at similar dose and frequency 11/30/14  Yes Debbe Odea, MD  Polyethyl Glycol-Propyl Glycol (SYSTANE ULTRA OP) Apply 1 drop to eye 2 (two) times daily.    Yes Historical Provider, MD  pyridOXINE (VITAMIN B-6) 100 MG tablet Take 100 mg by mouth daily.   Yes Historical Provider, MD   simvastatin (ZOCOR) 10 MG tablet Take 10 mg by mouth every evening.    Yes Historical Provider, MD  apixaban (ELIQUIS) 5 MG TABS tablet Take 2 tablets (10 mg total) by mouth 2 (two) times daily. Patient not taking: Reported on 12/05/2014 11/30/14   Debbe Odea, MD   BP 146/55 mmHg  Pulse 66  Temp(Src) 98.3 F (36.8 C) (Oral)  Resp 18  SpO2 95% Physical Exam  Constitutional: She is oriented to person, place, and time. She appears well-developed and well-nourished. No distress.  HENT:  Head: Normocephalic and atraumatic.  Mouth/Throat: Oropharynx is clear and moist.  Eyes: EOM are normal. Pupils are equal, round, and reactive to light.  Neck: Normal range of motion. Neck supple.  Cardiovascular: Normal rate and regular rhythm.  Exam reveals no friction rub.   No murmur heard. Pulmonary/Chest: Effort normal and breath sounds normal. No respiratory distress. She has no wheezes. She has no rales.  Abdominal: Soft. She exhibits distension (mild, lower). There is no tenderness. There is no rebound.  Musculoskeletal: Normal range of motion. She exhibits edema (mild, LLE, losing contours of wrinkles when compared to R side. No frank edema).  Neurological: She is alert and oriented to person, place, and time.  Skin: No rash noted. She is not diaphoretic.  Nursing note and vitals reviewed.   ED Course  Procedures (including critical care time) Labs Review Labs Reviewed  CBC  BASIC METABOLIC PANEL  BRAIN NATRIURETIC PEPTIDE  PROTIME-INR  I-STAT [place], ED    Imaging Review Ct Angio Chest Pe W/cm &/or Wo Cm  12/05/2014   CLINICAL DATA:  Abdominal pain radiating to the left hip and groin. Known blood clots. Left lower extremity swelling.  EXAM: CT ANGIOGRAPHY CHEST  CT ABDOMEN AND PELVIS WITH CONTRAST  TECHNIQUE: Multidetector CT imaging of the chest was performed using the standard protocol during bolus administration of intravenous contrast. Multiplanar CT image reconstructions and  MIPs were obtained to evaluate the vascular anatomy. Multidetector CT imaging of the abdomen and pelvis was performed using the standard protocol during bolus administration of intravenous contrast.  CONTRAST:  37mL OMNIPAQUE IOHEXOL 300 MG/ML SOLN, 65mL OMNIPAQUE IOHEXOL 350 MG/ML SOLN  COMPARISON:  11/27/2014 chest CT  FINDINGS: CTA CHEST FINDINGS  THORACIC INLET/BODY WALL:  Dual-chamber pacer from the right in unremarkable position.  MEDIASTINUM:  Significant decrease in the bilateral pulmonary emboli, with nonocclusive clot now seen in the posterior segment right upper lobe segmental arteries and basilar subsegmental vessels to the right lower lobe. There is no evidence of right heart strain.  No acute aortic findings.  Chronic cardiomegaly.  No pericardial effusion.  LUNG WINDOWS:  Mild dependent atelectasis, greater in the left lower lobe. Trace bilateral  pleural effusion. No evidence of edema or pneumonia.  Small pulmonary nodules, including 3 mm nodule in the right apical lung on image 48, too small for imaging follow-up recommendations in this never smoker.  OSSEOUS:  Remote healed left posterior rib fractures.  No acute findings.  CT ABDOMEN and PELVIS FINDINGS  BODY WALL: No contributory findings.  Liver: Numerous circumscribed low-density hepatic lesions consistent with cysts, largest in segment 3 measuring 18 mm.  Biliary: No evidence of biliary obstruction or stone.  Pancreas: Pancreas divisum. No evidence of acute or remote inflammation.  Spleen: Splenomegaly with 14 cm AP dimension and mild heterogeneous enhancement. This is presumably related to the patient's history of polycythemia.  Adrenals: Unremarkable.  Kidneys and ureters: Numerous bilateral renal cortical lesions consistent with cysts. Some are complicated, including a 39 mm cyst exophytic from the lower pole right kidney which has benign mural calcification. There are 2 dense renal lesions within the posterior left kidney, both measuring  near 1 cm and present on images 41 and 45. Due to small size, these are difficult to further characterize.  No hydronephrosis or stone.  Bladder: Unremarkable.  Reproductive: No pathologic findings.  Bowel: Distal colonic diverticulosis. No active inflammation. No bowel obstruction. Appendectomy. No pericecal inflammatory change.  Retroperitoneum: No mass or adenopathy.  Peritoneum: Trace pelvic fluid which could be from volume overload.  Vascular: No acute abnormality.  OSSEOUS: No acute abnormalities.  Advanced lower lumbar facet arthritis with L4-5 grade 1 anterolisthesis. Facet overgrowth causes advanced canal stenosis at L3-4.  Review of the MIP images confirms the above findings.  IMPRESSION: 1. Decrease in pulmonary artery clot burden compared to 11/27/2014. No evidence of interval pulmonary embolism. 2. No explanation for acute abdominal pain. 3. Bibasilar atelectasis with trace effusion on the left. 4. Chronic splenomegaly, likely from patient's history of polycythemia vera. 5. Numerous bilateral renal cysts. There are also 2 approximately 10 mm complex cysts or solid tumors within the left kidney. Further evaluation would require enhanced MRI, as noted on CT 11/27/2014. 6. Other incidental findings are noted above, and stable from prior   Electronically Signed   By: Monte Fantasia M.D.   On: 12/05/2014 13:02   Ct Abdomen Pelvis W Contrast  12/05/2014   CLINICAL DATA:  Abdominal pain radiating to the left hip and groin. Known blood clots. Left lower extremity swelling.  EXAM: CT ANGIOGRAPHY CHEST  CT ABDOMEN AND PELVIS WITH CONTRAST  TECHNIQUE: Multidetector CT imaging of the chest was performed using the standard protocol during bolus administration of intravenous contrast. Multiplanar CT image reconstructions and MIPs were obtained to evaluate the vascular anatomy. Multidetector CT imaging of the abdomen and pelvis was performed using the standard protocol during bolus administration of intravenous  contrast.  CONTRAST:  3mL OMNIPAQUE IOHEXOL 300 MG/ML SOLN, 60mL OMNIPAQUE IOHEXOL 350 MG/ML SOLN  COMPARISON:  11/27/2014 chest CT  FINDINGS: CTA CHEST FINDINGS  THORACIC INLET/BODY WALL:  Dual-chamber pacer from the right in unremarkable position.  MEDIASTINUM:  Significant decrease in the bilateral pulmonary emboli, with nonocclusive clot now seen in the posterior segment right upper lobe segmental arteries and basilar subsegmental vessels to the right lower lobe. There is no evidence of right heart strain.  No acute aortic findings.  Chronic cardiomegaly.  No pericardial effusion.  LUNG WINDOWS:  Mild dependent atelectasis, greater in the left lower lobe. Trace bilateral pleural effusion. No evidence of edema or pneumonia.  Small pulmonary nodules, including 3 mm nodule in the right apical lung on  image 48, too small for imaging follow-up recommendations in this never smoker.  OSSEOUS:  Remote healed left posterior rib fractures.  No acute findings.  CT ABDOMEN and PELVIS FINDINGS  BODY WALL: No contributory findings.  Liver: Numerous circumscribed low-density hepatic lesions consistent with cysts, largest in segment 3 measuring 18 mm.  Biliary: No evidence of biliary obstruction or stone.  Pancreas: Pancreas divisum. No evidence of acute or remote inflammation.  Spleen: Splenomegaly with 14 cm AP dimension and mild heterogeneous enhancement. This is presumably related to the patient's history of polycythemia.  Adrenals: Unremarkable.  Kidneys and ureters: Numerous bilateral renal cortical lesions consistent with cysts. Some are complicated, including a 39 mm cyst exophytic from the lower pole right kidney which has benign mural calcification. There are 2 dense renal lesions within the posterior left kidney, both measuring near 1 cm and present on images 41 and 45. Due to small size, these are difficult to further characterize.  No hydronephrosis or stone.  Bladder: Unremarkable.  Reproductive: No pathologic  findings.  Bowel: Distal colonic diverticulosis. No active inflammation. No bowel obstruction. Appendectomy. No pericecal inflammatory change.  Retroperitoneum: No mass or adenopathy.  Peritoneum: Trace pelvic fluid which could be from volume overload.  Vascular: No acute abnormality.  OSSEOUS: No acute abnormalities.  Advanced lower lumbar facet arthritis with L4-5 grade 1 anterolisthesis. Facet overgrowth causes advanced canal stenosis at L3-4.  Review of the MIP images confirms the above findings.  IMPRESSION: 1. Decrease in pulmonary artery clot burden compared to 11/27/2014. No evidence of interval pulmonary embolism. 2. No explanation for acute abdominal pain. 3. Bibasilar atelectasis with trace effusion on the left. 4. Chronic splenomegaly, likely from patient's history of polycythemia vera. 5. Numerous bilateral renal cysts. There are also 2 approximately 10 mm complex cysts or solid tumors within the left kidney. Further evaluation would require enhanced MRI, as noted on CT 11/27/2014. 6. Other incidental findings are noted above, and stable from prior   Electronically Signed   By: Monte Fantasia M.D.   On: 12/05/2014 13:02     EKG Interpretation   Date/Time:  Tuesday December 05 2014 10:10:49 EDT Ventricular Rate:  71 PR Interval:  227 QRS Duration: 152 QT Interval:  420 QTC Calculation: 456 R Axis:   63 Text Interpretation:  Sinus arrhythmia Ventricular premature complex  Prolonged PR interval Probable left atrial enlargement Nonspecific  intraventricular conduction delay Probable anteroseptal infarct, recent No  significant change since last tracing Confirmed by Mingo Amber  MD, [place]  ([id number]) on 12/05/2014 10:23:17 AM      Author: Nani Ravens, RVT Service: Vascular Lab Author Type: Cardiovascular Sonographer    Filed: 12/05/2014 11:48 AM Note Time: 12/05/2014 11:47 AM Status: Signed   Editor: Nani Ravens, RVT (Cardiovascular Sonographer)     Expand All Collapse All   VASCULAR  LAB PRELIMINARY PRELIMINARY PRELIMINARY PRELIMINARY  Left lower extremity venous duplex completed.   Preliminary report: Left: No evidence of DVT, superficial thrombosis, or Baker's cyst.  Cestone,Helene, RVT 12/05/2014, 11:47 AM      MDM   Final diagnoses:  Abdominal pain  Dyspnea    81 year old female here with shortness of breath. She was recently admitted with PEs and started on apixiban. During her hospitalization she had an EGD that found a non-bleeding linear ulcer. She had no drop in hemoglobin and no black stools or melena during that hospitalization. Upon leaving, she felt well, but has had worsening SOB since leaving. She's also having some CP  and some increased abdominal pain. CT done prior to previous admission showed some small renal masses concerning for tumors. She states some mild abdominal distention since leaving the hospital. Here she's also having some L leg swelling. AFVSS. Mild lower abdominal distention without pain. She is short of breath with clear lungs. Will start with labs, repeat PE study, LLE venous doppler.  1147 - No DVT per Vascular Tech.  Workup with no worsening PE. CT Abdomen/Pelvis unremarkable. Lab workup unremarkable. Patient is still short of breath. I am concerned with patient's symptoms, we'll plan for admission.  Evelina Bucy, MD 12/05/14 1435

## 2014-12-05 NOTE — Progress Notes (Signed)
NURSING PROGRESS NOTE  SHANNA UN [id number] Admission Data: 12/05/2014 8:20 PM Attending Provider: Domenic Polite, MD [id number] S, MD Code Status: DNR  [name] is a 81 y.o. female patient admitted from ED:  -No acute distress noted.  -No complaints of shortness of breath.  -No complaints of chest pain.   Cardiac Monitoring: Box # 22 in place. Cardiac monitor yields:paced.  Blood pressure 152/63, pulse 76, temperature 98.1 F (36.7 C), temperature source Oral, resp. rate 20, SpO2 95 %.   IV Fluids:  IV in place, occlusive dsg intact without redness, IV cath wrist right, condition patent and no redness none.   Allergies:  Chocolate and Pollen extract  Past Medical History:   has a past medical history of Complete heart block; Hypercholesterolemia; Polycythemia vera(238.4); Syncope; Hypertension; Stroke; and Pulmonary emboli.  Past Surgical History:   has past surgical history that includes Pacemaker insertion (03/26/09); Hand surgery; Appendectomy; transthoracic echocardiogram (03/26/2009); US echocardiography (03/01/2009); and Esophagogastroduodenoscopy (N/A, 11/30/2014).  Social History:   reports that she has never smoked. She has never used smokeless tobacco. She reports that she does not drink alcohol or use illicit drugs.  Skin: Intact  Patient/Family orientated to room. Information packet given to patient/family. Admission inpatient armband information verified with patient/family to include name and date of birth and placed on patient arm. Side rails up x 2, fall assessment and education completed with patient/family. Patient/family able to verbalize understanding of risk associated with falls and verbalized understanding to call for assistance before getting out of bed. Call light within reach. Patient/family able to voice and demonstrate understanding of unit orientation instructions.    Will continue to evaluate and treat per MD orders.

## 2014-12-06 ENCOUNTER — Observation Stay (HOSPITAL_COMMUNITY): Payer: Medicare Other

## 2014-12-06 ENCOUNTER — Encounter (HOSPITAL_COMMUNITY): Payer: Self-pay | Admitting: *Deleted

## 2014-12-06 DIAGNOSIS — R29818 Other symptoms and signs involving the nervous system: Secondary | ICD-10-CM | POA: Diagnosis not present

## 2014-12-06 DIAGNOSIS — R0600 Dyspnea, unspecified: Secondary | ICD-10-CM | POA: Diagnosis not present

## 2014-12-06 DIAGNOSIS — R0602 Shortness of breath: Secondary | ICD-10-CM | POA: Diagnosis not present

## 2014-12-06 LAB — URINE MICROSCOPIC-ADD ON

## 2014-12-06 LAB — URINALYSIS, ROUTINE W REFLEX MICROSCOPIC
BILIRUBIN URINE: NEGATIVE
GLUCOSE, UA: NEGATIVE mg/dL
Ketones, ur: NEGATIVE mg/dL
Nitrite: NEGATIVE
PH: 6.5 (ref 5.0–8.0)
Protein, ur: NEGATIVE mg/dL
Specific Gravity, Urine: 1.017 (ref 1.005–1.030)
Urobilinogen, UA: 0.2 mg/dL (ref 0.0–1.0)

## 2014-12-06 LAB — GLUCOSE, CAPILLARY
GLUCOSE-CAPILLARY: 81 mg/dL (ref 65–99)
Glucose-Capillary: 121 mg/dL — ABNORMAL HIGH (ref 65–99)
Glucose-Capillary: 87 mg/dL (ref 65–99)
Glucose-Capillary: 95 mg/dL (ref 65–99)

## 2014-12-06 LAB — LIPID PANEL
CHOL/HDL RATIO: 4.6 ratio
Cholesterol: 78 mg/dL (ref 0–200)
HDL: 17 mg/dL — ABNORMAL LOW (ref >40)
LDL Cholesterol: 37 mg/dL (ref 0–99)
TRIGLYCERIDES: 118 mg/dL (ref <150)
VLDL: 24 mg/dL (ref 0–40)

## 2014-12-06 LAB — HEMOGLOBIN A1C
HEMOGLOBIN A1C: 5.8 % — AB (ref 4.8–5.6)
MEAN PLASMA GLUCOSE: 120 mg/dL

## 2014-12-06 NOTE — Progress Notes (Signed)
[facility] PROGRESS NOTE  [name] LFY:[id number] DOB: 1923/04/17 DOA: 12/05/2014 PCP: Vidal Schwalbe, MD  Assessment/Plan: 1. Pulmonary embolism. -Patient having a recent hospitalization for PE, discharged on anticoagulation with Eliquis. She presents with exertional dyspnea. Repeat CT scan of lungs in the emergency room actually revealed improved clot burden compared to prior CT. -On exam she did not appear to be in significant respiratory distress. She actually ambulated down the hallway and maintain oxygen saturations in the mid 90s. -Continue anticoagulation.  2.  Left kidney lesions. -Patient presenting with left lower quadrant abdominal pain which was further worked up with CT scan. Urology consulted. Patient unable to obtain MRI due to pacemaker implant. Urology reporting that these lesions are small and have low potential for metastasis if they are malignant. -Will monitor conservatively, repeat CT scan in 6 months.  3.  History of complete heart block -Status post pacemaker implant  Code Status: DO NOT RESUSCITATE Family Communication: Spoke with patient's son was present at bedside Disposition Plan: Since his discharge in the next 24 hours to her home    HPI/Subjective: Patient is a pleasant 81 year old female who was recently admitted to the medicine service on 11/27/2014 where she was treated for pulmonary embolism. She was started on Eliquis and remained hemodynamically stable, discharged on 11/30/2014 to her home. Discharge family members had reported progressive generalized weakness along with dyspnea on exertion. She had also complains of left lower quadrant abdominal pain. She was worked up with a CT scan that revealed numerous small cysts versus tumors involving left kidney. This was discussed with urology and upon reviewing CT scan felt this likely was very low risk and recommended repeating CT scan in 6 months in the outpatient  setting.  Objective: Filed Vitals:   12/06/14 1604  BP: 183/68  Pulse: 77  Temp: 98.2 F (36.8 C)  Resp: 16    Intake/Output Summary (Last 24 hours) at 12/06/14 1923 Last data filed at 12/06/14 1423  Gross per 24 hour  Intake    243 ml  Output      0 ml  Net    243 ml   Filed Weights   12/06/14 0600  Weight: 54.613 kg (120 lb 6.4 oz)    Exam:   General:  Patient is in no acute distress, she is awake and alert oriented 3  Cardiovascular: Regular rate and rhythm normal S1-S2 no murmurs rubs or gallops  Respiratory: Normal respiratory effort, lungs are clear to auscultation bilaterally  Abdomen: Soft nontender nondistended positive bowel sounds  Musculoskeletal: Patient complaining of hip pain with abduction of left lower extremity  Data Reviewed: Basic Metabolic Panel:  Recent Labs Lab 12/05/14 1100  NA 140  K 4.3  CL 106  CO2 26  GLUCOSE 100*  BUN 18  CREATININE 0.80  CALCIUM 8.8*   Liver Function Tests: No results for input(s): AST, ALT, ALKPHOS, BILITOT, PROT, ALBUMIN in the last 168 hours. No results for input(s): LIPASE, AMYLASE in the last 168 hours. No results for input(s): AMMONIA in the last 168 hours. CBC:  Recent Labs Lab 11/30/14 0435 12/01/14 0330 12/05/14 1100  WBC 21.8* 22.8* 23.6*  HGB 12.1 12.4 12.7  HCT 41.0 41.8 42.3  MCV 64.1* 65.4* 63.8*  PLT 183 188 233   Cardiac Enzymes: No results for input(s): CKTOTAL, CKMB, CKMBINDEX, TROPONINI in the last 168 hours. BNP (last 3 results)  Recent Labs  12/05/14 1100  BNP 214.4*    ProBNP (last 3 results) No results  for input(s): PROBNP in the last 8760 hours.  CBG:  Recent Labs Lab 12/05/14 2139 12/06/14 0821 12/06/14 1151 12/06/14 1746  GLUCAP 146* 81 87 95    No results found for this or any previous visit (from the past 240 hour(s)).   Studies: Ct Angio Chest Pe W/cm &/or Wo Cm  12/05/2014   CLINICAL DATA:  Abdominal pain radiating to the left hip and groin.  Known blood clots. Left lower extremity swelling.  EXAM: CT ANGIOGRAPHY CHEST  CT ABDOMEN AND PELVIS WITH CONTRAST  TECHNIQUE: Multidetector CT imaging of the chest was performed using the standard protocol during bolus administration of intravenous contrast. Multiplanar CT image reconstructions and MIPs were obtained to evaluate the vascular anatomy. Multidetector CT imaging of the abdomen and pelvis was performed using the standard protocol during bolus administration of intravenous contrast.  CONTRAST:  8mL OMNIPAQUE IOHEXOL 300 MG/ML SOLN, 72mL OMNIPAQUE IOHEXOL 350 MG/ML SOLN  COMPARISON:  11/27/2014 chest CT  FINDINGS: CTA CHEST FINDINGS  THORACIC INLET/BODY WALL:  Dual-chamber pacer from the right in unremarkable position.  MEDIASTINUM:  Significant decrease in the bilateral pulmonary emboli, with nonocclusive clot now seen in the posterior segment right upper lobe segmental arteries and basilar subsegmental vessels to the right lower lobe. There is no evidence of right heart strain.  No acute aortic findings.  Chronic cardiomegaly.  No pericardial effusion.  LUNG WINDOWS:  Mild dependent atelectasis, greater in the left lower lobe. Trace bilateral pleural effusion. No evidence of edema or pneumonia.  Small pulmonary nodules, including 3 mm nodule in the right apical lung on image 48, too small for imaging follow-up recommendations in this never smoker.  OSSEOUS:  Remote healed left posterior rib fractures.  No acute findings.  CT ABDOMEN and PELVIS FINDINGS  BODY WALL: No contributory findings.  Liver: Numerous circumscribed low-density hepatic lesions consistent with cysts, largest in segment 3 measuring 18 mm.  Biliary: No evidence of biliary obstruction or stone.  Pancreas: Pancreas divisum. No evidence of acute or remote inflammation.  Spleen: Splenomegaly with 14 cm AP dimension and mild heterogeneous enhancement. This is presumably related to the patient's history of polycythemia.  Adrenals:  Unremarkable.  Kidneys and ureters: Numerous bilateral renal cortical lesions consistent with cysts. Some are complicated, including a 39 mm cyst exophytic from the lower pole right kidney which has benign mural calcification. There are 2 dense renal lesions within the posterior left kidney, both measuring near 1 cm and present on images 41 and 45. Due to small size, these are difficult to further characterize.  No hydronephrosis or stone.  Bladder: Unremarkable.  Reproductive: No pathologic findings.  Bowel: Distal colonic diverticulosis. No active inflammation. No bowel obstruction. Appendectomy. No pericecal inflammatory change.  Retroperitoneum: No mass or adenopathy.  Peritoneum: Trace pelvic fluid which could be from volume overload.  Vascular: No acute abnormality.  OSSEOUS: No acute abnormalities.  Advanced lower lumbar facet arthritis with L4-5 grade 1 anterolisthesis. Facet overgrowth causes advanced canal stenosis at L3-4.  Review of the MIP images confirms the above findings.  IMPRESSION: 1. Decrease in pulmonary artery clot burden compared to 11/27/2014. No evidence of interval pulmonary embolism. 2. No explanation for acute abdominal pain. 3. Bibasilar atelectasis with trace effusion on the left. 4. Chronic splenomegaly, likely from patient's history of polycythemia vera. 5. Numerous bilateral renal cysts. There are also 2 approximately 10 mm complex cysts or solid tumors within the left kidney. Further evaluation would require enhanced MRI, as noted on CT 11/27/2014.  6. Other incidental findings are noted above, and stable from prior   Electronically Signed   By: Monte Fantasia M.D.   On: 12/05/2014 13:02   Ct Abdomen Pelvis W Contrast  12/05/2014   CLINICAL DATA:  Abdominal pain radiating to the left hip and groin. Known blood clots. Left lower extremity swelling.  EXAM: CT ANGIOGRAPHY CHEST  CT ABDOMEN AND PELVIS WITH CONTRAST  TECHNIQUE: Multidetector CT imaging of the chest was performed using  the standard protocol during bolus administration of intravenous contrast. Multiplanar CT image reconstructions and MIPs were obtained to evaluate the vascular anatomy. Multidetector CT imaging of the abdomen and pelvis was performed using the standard protocol during bolus administration of intravenous contrast.  CONTRAST:  36mL OMNIPAQUE IOHEXOL 300 MG/ML SOLN, 75mL OMNIPAQUE IOHEXOL 350 MG/ML SOLN  COMPARISON:  11/27/2014 chest CT  FINDINGS: CTA CHEST FINDINGS  THORACIC INLET/BODY WALL:  Dual-chamber pacer from the right in unremarkable position.  MEDIASTINUM:  Significant decrease in the bilateral pulmonary emboli, with nonocclusive clot now seen in the posterior segment right upper lobe segmental arteries and basilar subsegmental vessels to the right lower lobe. There is no evidence of right heart strain.  No acute aortic findings.  Chronic cardiomegaly.  No pericardial effusion.  LUNG WINDOWS:  Mild dependent atelectasis, greater in the left lower lobe. Trace bilateral pleural effusion. No evidence of edema or pneumonia.  Small pulmonary nodules, including 3 mm nodule in the right apical lung on image 48, too small for imaging follow-up recommendations in this never smoker.  OSSEOUS:  Remote healed left posterior rib fractures.  No acute findings.  CT ABDOMEN and PELVIS FINDINGS  BODY WALL: No contributory findings.  Liver: Numerous circumscribed low-density hepatic lesions consistent with cysts, largest in segment 3 measuring 18 mm.  Biliary: No evidence of biliary obstruction or stone.  Pancreas: Pancreas divisum. No evidence of acute or remote inflammation.  Spleen: Splenomegaly with 14 cm AP dimension and mild heterogeneous enhancement. This is presumably related to the patient's history of polycythemia.  Adrenals: Unremarkable.  Kidneys and ureters: Numerous bilateral renal cortical lesions consistent with cysts. Some are complicated, including a 39 mm cyst exophytic from the lower pole right kidney which  has benign mural calcification. There are 2 dense renal lesions within the posterior left kidney, both measuring near 1 cm and present on images 41 and 45. Due to small size, these are difficult to further characterize.  No hydronephrosis or stone.  Bladder: Unremarkable.  Reproductive: No pathologic findings.  Bowel: Distal colonic diverticulosis. No active inflammation. No bowel obstruction. Appendectomy. No pericecal inflammatory change.  Retroperitoneum: No mass or adenopathy.  Peritoneum: Trace pelvic fluid which could be from volume overload.  Vascular: No acute abnormality.  OSSEOUS: No acute abnormalities.  Advanced lower lumbar facet arthritis with L4-5 grade 1 anterolisthesis. Facet overgrowth causes advanced canal stenosis at L3-4.  Review of the MIP images confirms the above findings.  IMPRESSION: 1. Decrease in pulmonary artery clot burden compared to 11/27/2014. No evidence of interval pulmonary embolism. 2. No explanation for acute abdominal pain. 3. Bibasilar atelectasis with trace effusion on the left. 4. Chronic splenomegaly, likely from patient's history of polycythemia vera. 5. Numerous bilateral renal cysts. There are also 2 approximately 10 mm complex cysts or solid tumors within the left kidney. Further evaluation would require enhanced MRI, as noted on CT 11/27/2014. 6. Other incidental findings are noted above, and stable from prior   Electronically Signed   By: Neva Seat.D.  On: 12/05/2014 13:02    Scheduled Meds: . amLODipine  5 mg Oral Daily  . apixaban  5 mg Oral BID  . beta carotene w/minerals  1 tablet Oral Daily  . calcium-vitamin D  2 tablet Oral Daily  . pantoprazole  40 mg Oral Daily  . polyvinyl alcohol   Both Eyes BID  . pyridOXINE  100 mg Oral Daily  . simvastatin  10 mg Oral QPM  . sodium chloride  3 mL Intravenous Q12H   Continuous Infusions:   Principal Problem:   Balance problems Active Problems:   DYSPNEA   PACEMAKER   Acute pulmonary embolus    Polycythemia rubra vera   Dyspnea   Left flank pain   Balance problem    Time spent: 20 minutes    Kelvin Cellar  [facility] Pager [phone]. If 7PM-7AM, please contact night-coverage at [email or web address], password [employer] 12/06/2014, 7:23 PM

## 2014-12-06 NOTE — Progress Notes (Signed)
NCM spoke with patient and gave her information and brochure for outpt vestibular therapy at [facility] [place] .  Patient states she does not think she want to do this.  NCM informed her that this will help her with her vertigo, she states she will need to think about it.  NCM informed patient that a referral was sent in for her for this and they will contact her at home to schedule appointment times and if she decides that she does not want to do this at that time to let neuro rehab know when they call her.

## 2014-12-06 NOTE — Evaluation (Signed)
Physical Therapy Evaluation Patient Details Name: [name] MRN: [id number] DOB: 27-Jul-1922 Today's Date: 12/06/2014   History of Present Illness  Very sweet 69F who underwent a CT scan for persistent LLQ pain which has been present for the past 2.5 weeks. The CT scan was largely unremarkable as it relates to the patient's abdominal pain. However, there was some findings on her kidney that were unusual. The patient was found to have complex cysts bilaterally. There were 2 areas on the right kidney in the lower pole measuring approximate 10 mm each that were hyperdense and difficult to ascertain whether these were enhancing renal masses or hyperdense cyst. There was no hydronephrosis or obstruction of the kidneys bilaterally. The patient has no history of GU malignancy. She has no history of kidney stones or recurrent urinary tract infections. She's had no GU surgeries.  Clinical Impression  Pt admitted with above diagnosis. Pt currently with functional limitations due to the deficits listed below (see PT Problem List). Pt will need RW for home and son has one she can borrow.  Pt had difficulty doing x1 vestibular exercises as it hurt her neck. May need Meclizine if dizziness persists and she can't do exercises.  Outpt PT recommended for vestibualar rehab.   Pt will benefit from skilled PT to increase their independence and safety with mobility to allow discharge to the venue listed below.      Follow Up Recommendations Outpatient PT;Supervision - Intermittent (for vestibular rehab)    Equipment Recommendations  Other (comment) (son has RW that pt can borrow.)    Recommendations for Other Services       Precautions / Restrictions Precautions Precautions: Fall Restrictions Weight Bearing Restrictions: No      Mobility  Bed Mobility Overal bed mobility: Independent             General bed mobility comments: Modified hallpike and supine head roll negative bil. Head thrust  positive right side suggesting right hypofunction.  Transfers Overall transfer level: Needs assistance Equipment used: None Transfers: Sit to/from Stand Sit to Stand: Min guard         General transfer comment: cues for safety  Ambulation/Gait Ambulation/Gait assistance: Min guard Ambulation Distance (Feet): 650 Feet Assistive device: Rolling walker (2 wheeled);None Gait Pattern/deviations: Step-through pattern;Decreased stride length;Drifts right/left   Gait velocity interpretation: at or above normal speed for age/gender General Gait Details: Pt ambulated down hall without RW with right drift and occasional LOB with challenges.  Pt then ambulated with shoes on with the RW and was much steadier.  Then pt ambulated without RW with shoes on with same drift right. Pt aware that she needs to use RW at all times initially.  Son has RW pt can borrow.    Stairs            Wheelchair Mobility    Modified Rankin (Stroke Patients Only)       Balance Overall balance assessment: Needs assistance         Standing balance support: No upper extremity supported;During functional activity Standing balance-Leahy Scale: Fair Standing balance comment: can stand statically without UE support for brief periods and without challenges.                  Standardized Balance Assessment Standardized Balance Assessment : Dynamic Gait Index   Dynamic Gait Index Level Surface: Normal Change in Gait Speed: Mild Impairment Gait with Horizontal Head Turns: Moderate Impairment Gait with Vertical Head Turns: Mild Impairment Gait and  Pivot Turn: Normal Step Over Obstacle: Normal Step Around Obstacles: Normal Steps: Mild Impairment Total Score: 19       Pertinent Vitals/Pain Pain Assessment: Faces Faces Pain Scale: Hurts a little bit Pain Location: neck Pain Descriptors / Indicators: Discomfort;Sore Pain Intervention(s): Limited activity within patient's tolerance;Monitored  during session;Repositioned  VSS    Home Living Family/patient expects to be discharged to:: Private residence Living Arrangements: Alone Available Help at Discharge: Family;Available PRN/intermittently (3 children check on her daily and can drive pt) Type of Home: Other(Comment) (condo) Home Access: Elevator     Home Layout: One level Home Equipment: None Additional Comments: 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  Walks 1 hour daily per pt.    Prior Function Level of Independence: Independent               Hand Dominance        Extremity/Trunk Assessment   Upper Extremity Assessment: Defer to OT evaluation           Lower Extremity Assessment: Overall WFL for tasks assessed      Cervical / Trunk Assessment: Kyphotic (slightly)  Communication   Communication: No difficulties  Cognition Arousal/Alertness: Awake/alert Behavior During Therapy: WFL for tasks assessed/performed Overall Cognitive Status: Within Functional Limits for tasks assessed                      General Comments General comments (skin integrity, edema, etc.): Pt scored 19/24 suggesting a low risk of falls on DGI.  Will need to use RW intially on d/c.      Exercises Other Exercises Other Exercises: initiated x1 exercises with pt however after performing 2 x, pt c/o neck pain and said it hurts her pacemaker.  States she didn't want to do them any more.  Pt encouraged to rest and try again later.  OFfered heat pack to pt with pt refusing that.  Educated pt that if dizziness and imbalance persists and seh can't perform exercises, she can take MEclizine possibly prn        Assessment/Plan    PT Assessment Patient needs continued PT services  PT Diagnosis Generalized weakness   PT Problem List Decreased balance;Decreased activity tolerance;Decreased mobility;Decreased knowledge of use of DME;Decreased safety awareness;Decreased knowledge of precautions (dizziness)  PT Treatment  Interventions DME instruction;Gait training;Functional mobility training;Therapeutic activities;Therapeutic exercise;Balance training;Patient/family education (gaze stability exercises)   PT Goals (Current goals can be found in the Care Plan section) Acute Rehab PT Goals Patient Stated Goal: to go home PT Goal Formulation: With patient Time For Goal Achievement: 12/13/14 Potential to Achieve Goals: Good    Frequency Min 3X/week   Barriers to discharge        Co-evaluation               End of Session Equipment Utilized During Treatment: Gait belt Activity Tolerance: Patient limited by fatigue;Patient limited by pain Patient left: in chair;with call bell/phone within reach;with family/visitor present Nurse Communication: Mobility status    Functional Assessment Tool Used: clinical judgment Functional Limitation: Mobility: Walking and moving around Mobility: Walking and Moving Around Current Status [phone]): At least 1 percent but less than 20 percent impaired, limited or restricted Mobility: Walking and Moving Around Goal Status [phone]): 0 percent impaired, limited or restricted    Time: 1322-1410 PT Time Calculation (min) (ACUTE ONLY): 48 min   Charges:   PT Evaluation $Initial PT Evaluation Tier I: 1 Procedure PT Treatments $Gait Training: 8-22  mins $Self Care/Home Management: 8-22   PT G Codes:   PT G-Codes **NOT FOR INPATIENT CLASS** Functional Assessment Tool Used: clinical judgment Functional Limitation: Mobility: Walking and moving around Mobility: Walking and Moving Around Current Status ([id number]): At least 1 percent but less than 20 percent impaired, limited or restricted Mobility: Walking and Moving Around Goal Status [phone]): 0 percent impaired, limited or restricted    Denice Paradise 12/06/2014, 2:45 PM Amara Manalang,PT Acute Rehabilitation [phone] (pager)

## 2014-12-06 NOTE — Progress Notes (Signed)
OT Cancellation Note  Patient Details Name: PERLINE AWE MRN: [id number] DOB: 11-12-1922   Cancelled Treatment:    Reason Eval/Treat Not Completed: OT screened, no needs identified, will sign off. PT notified OT that pt has no OT needs.   Benito Mccreedy OTR/L [phone] 12/06/2014, 2:45 PM

## 2014-12-07 DIAGNOSIS — D45 Polycythemia vera: Secondary | ICD-10-CM | POA: Diagnosis not present

## 2014-12-07 DIAGNOSIS — R0602 Shortness of breath: Secondary | ICD-10-CM | POA: Diagnosis not present

## 2014-12-07 DIAGNOSIS — R0600 Dyspnea, unspecified: Secondary | ICD-10-CM | POA: Diagnosis not present

## 2014-12-07 DIAGNOSIS — R29818 Other symptoms and signs involving the nervous system: Secondary | ICD-10-CM | POA: Diagnosis not present

## 2014-12-07 LAB — URINE CULTURE

## 2014-12-07 MED ORDER — POLYETHYLENE GLYCOL 3350 17 G PO PACK
17.0000 g | PACK | Freq: Once | ORAL | Status: AC
  Administered 2014-12-07: 17 g via ORAL
  Filled 2014-12-07: qty 1

## 2014-12-07 MED ORDER — ACETAMINOPHEN 325 MG PO TABS: 650.0000 mg | ORAL_TABLET | Freq: Four times a day (QID) | ORAL | Status: DC | PRN

## 2014-12-07 NOTE — Progress Notes (Signed)
Physical Therapy Treatment Patient Details Name: [name] MRN: [id number] DOB: 1922-06-30 Today's Date: 12/07/2014    History of Present Illness Very sweet 54F who underwent a CT scan for persistent LLQ pain which has been present for the past 2.5 weeks. The CT scan was largely unremarkable as it relates to the patient's abdominal pain. However, there was some findings on her kidney that were unusual. The patient was found to have complex cysts bilaterally. There were 2 areas on the right kidney in the lower pole measuring approximate 10 mm each that were hyperdense and difficult to ascertain whether these were enhancing renal masses or hyperdense cyst. There was no hydronephrosis or obstruction of the kidneys bilaterally. The patient has no history of GU malignancy. She has no history of kidney stones or recurrent urinary tract infections. She's had no GU surgeries.    PT Comments    Pt admitted with above diagnosis. Pt currently with functional limitations due to vertigo/balance deficits. Pt to d/c home and f/u with Outpt PT and use RW initially at all times.  Family supportive.  Pt will benefit from skilled PT to increase their independence and safety with mobility to allow discharge to the venue listed below.    Follow Up Recommendations  Outpatient PT;Supervision - Intermittent (for vestibular rehab)     Equipment Recommendations  Other (comment) (son has RW that pt can borrow.)    Recommendations for Other Services       Precautions / Restrictions Precautions Precautions: Fall Restrictions Weight Bearing Restrictions: No    Mobility  Bed Mobility Overal bed mobility: Independent                Transfers Overall transfer level: Independent               General transfer comment: Pt able to put her pants, bra shirt and shoes on by herself.    Ambulation/Gait Ambulation/Gait assistance: Modified independent (Device/Increase time) Ambulation Distance  (Feet): 550 Feet Assistive device: Rolling walker (2 wheeled);None Gait Pattern/deviations: Step-through pattern;Decreased stride length;Drifts right/left   Gait velocity interpretation: at or above normal speed for age/gender General Gait Details: Pt ambulated down hall without RW with right drift and occasional LOB with challenges which she was able to self correct.  Pt then ambulated with the RW and was much steadier.  Pt aware that she needs to use RW at all times initially and says she will for the first week.  Son has RW pt can borrow.     Stairs            Wheelchair Mobility    Modified Rankin (Stroke Patients Only)       Balance Overall balance assessment: Needs assistance         Standing balance support: No upper extremity supported;During functional activity Standing balance-Leahy Scale: Fair Standing balance comment: can stand statically without UE support and with min challenges and maintain balanc.e                     Cognition Arousal/Alertness: Awake/alert Behavior During Therapy: WFL for tasks assessed/performed Overall Cognitive Status: Within Functional Limits for tasks assessed                      Exercises Other Exercises Other Exercises: refuses to do exercises for gaze stability because it hurts her eyes.  Told pt that that was normal and to try to work through it but doubt that pt will  perform them any longer.      General Comments        Pertinent Vitals/Pain Pain Assessment: No/denies pain  VSS    Home Living                      Prior Function            PT Goals (current goals can now be found in the care plan section) Progress towards PT goals: Progressing toward goals    Frequency  Min 3X/week    PT Plan Current plan remains appropriate    Co-evaluation             End of Session Equipment Utilized During Treatment: Gait belt Activity Tolerance: Patient tolerated treatment well Patient  left: in bed;with call bell/phone within reach (sitting on EOB)     Time: 1610-9604 PT Time Calculation (min) (ACUTE ONLY): 15 min  Charges:  $Gait Training: 8-22 mins                    G CodesDenice Paradise 2014-12-29, 12:54 PM  Denita Lun,PT Acute Rehabilitation [phone] (pager)

## 2014-12-07 NOTE — Discharge Summary (Signed)
Physician Discharge Summary  [name] OHY:[id number] DOB: 04/22/1923 DOA: 12/05/2014  PCP: Vidal Schwalbe, MD  Admit date: 12/05/2014 Discharge date: 12/07/2014  Time spent: 35 minutes  Recommendations for Outpatient Follow-up:  1. Please follow up on patient's respiratory status, she was recently diagnosed with PE on Eliquis.  2. CBC and BMP on hospital follow up  Discharge Diagnoses:  Principal Problem:   Balance problems Active Problems:   DYSPNEA   PACEMAKER   Acute pulmonary embolus   Polycythemia rubra vera   Dyspnea   Left flank pain   Balance problem   Discharge Condition: Stable  Diet recommendation: Heart Healthy  Filed Weights   12/06/14 0600  Weight: 54.613 kg (120 lb 6.4 oz)    History of present illness:  [name] is a pleasant 81 y.o. female from home alone, with PMH significant for HTN, hypercholesterolemia, syncope, complete heart block, stroke, pulmonary emboli and polycythemia vera who presents to the ED with worsening SOB. She was released from the hospital on 11/30/2014 after being treated for bilateral pulmonary embolism. She states that since her discharge she has become progressively more short of breath. She is not hypoxic in the ER.   The patient's son complains that his mother has been holding the walls as she ambulates for the past two days. She normally walks an hour a day, independently without a cane or walker. The patient states that she has been intermittently dizzy for awhile particularly in the morning. When waking this morning she said she was dizzy, had a bit of a headache and was nauseous. She believes the Anaprox is what made her dizzy. She also complains of a 2 week duration L flank/back pain that radiates to her anterior groin.  In the ED her blood pressure ranged from 146/55-170/50, O2 sat 93-97%, venous doppler study was negative for evidence of DVT. CT abdomen/pelvis shows numerous bilateral renal cysts vs tumor  and chronic splenomegaly. Repeat CT Angio chest shows decreased clot burden.  Hospital Course:  Patient is a pleasant 81 year old female who was recently admitted to the medicine service on 11/27/2014 where she was treated for pulmonary embolism. She was started on Eliquis and remained hemodynamically stable, discharged on 11/30/2014 to her home. Discharge family members had reported progressive generalized weakness along with dyspnea on exertion. She had also complains of left lower quadrant abdominal pain. She was worked up with a CT scan that revealed numerous small cysts versus tumors involving left kidney. This was discussed with urology and upon reviewing CT scan felt this likely was very low risk and recommended repeating CT scan in 6 months in the outpatient setting.   Pulmonary embolism. -Patient having a recent hospitalization for PE, discharged on anticoagulation with Eliquis. She presents with exertional dyspnea. Repeat CT scan of lungs in the emergency room actually revealed improved clot burden compared to prior CT. -On exam she did not appear to be in significant respiratory distress. She actually ambulated down the hallway and maintain oxygen saturations in the mid 90s. -Continue anticoagulation with Eliquis  2. Left kidney lesions. -Patient presenting with left lower quadrant abdominal pain which was further worked up with CT scan. Urology consulted. Patient unable to obtain MRI due to pacemaker implant. Urology reporting that these lesions are small and have low potential for metastasis if they are malignant. -Will monitor conservatively, repeat CT scan in 6 months.  3. History of complete heart block -Status post pacemaker implant   Discharge Exam: Filed Vitals:  12/07/14 0449  BP: 146/56  Pulse: 66  Temp:   Resp: 18     General: Patient is in no acute distress, she is awake and alert oriented 3, she is ambulating down the hallway with her walker multiple times,  states feeling good and is ready to go home today  Cardiovascular: Regular rate and rhythm normal S1-S2 no murmurs rubs or gallops  Respiratory: Normal respiratory effort, lungs are clear to auscultation bilaterally  Abdomen: Soft nontender nondistended positive bowel sounds  Musculoskeletal: No edema  Discharge Instructions   Discharge Instructions    Call MD for:  difficulty breathing, headache or visual disturbances    Complete by:  As directed      Call MD for:  extreme fatigue    Complete by:  As directed      Call MD for:  hives    Complete by:  As directed      Call MD for:  persistant dizziness or light-headedness    Complete by:  As directed      Call MD for:  persistant nausea and vomiting    Complete by:  As directed      Call MD for:  redness, tenderness, or signs of infection (pain, swelling, redness, odor or green/yellow discharge around incision site)    Complete by:  As directed      Call MD for:  severe uncontrolled pain    Complete by:  As directed      Call MD for:  temperature >100.4    Complete by:  As directed      Call MD for:    Complete by:  As directed      Diet - low sodium heart healthy    Complete by:  As directed      Increase activity slowly    Complete by:  As directed           Current Discharge Medication List    START taking these medications   Details  acetaminophen (TYLENOL) 325 MG tablet Take 2 tablets (650 mg total) by mouth every 6 (six) hours as needed for mild pain (temperature >/= 99.5 F). Qty: 30 tablet, Refills: 0      CONTINUE these medications which have NOT CHANGED   Details  amLODipine (NORVASC) 5 MG tablet Take 5 mg by mouth daily.      apixaban (ELIQUIS) 5 MG TABS tablet Take 1 tablet (5 mg total) by mouth 2 (two) times daily. Qty: 60 tablet, Refills: 0    beta carotene w/minerals (OCUVITE) tablet Take 1 tablet by mouth daily.     Associated Diagnoses: Polycythemia    calcium-vitamin D (OSCAL WITH D) 500-200  MG-UNIT per tablet Take 2 tablets by mouth daily.      hydroxyurea (HYDREA) 500 MG capsule TAKE 1 CAPSULE (500 MG TOTAL) BY MOUTH ONCE A WEEK. MAY TAKE WITH FOOD TO MINIMIZE GI SIDE EFFECTS. Qty: 10 capsule, Refills: 3    pantoprazole (PROTONIX) 40 MG tablet Take 1 tablet (40 mg total) by mouth daily. Switch for any other PPI at similar dose and frequency Qty: 30 tablet, Refills: 3    Polyethyl Glycol-Propyl Glycol (SYSTANE ULTRA OP) Apply 1 drop to eye 2 (two) times daily.    Associated Diagnoses: Polycythemia    pyridOXINE (VITAMIN B-6) 100 MG tablet Take 100 mg by mouth daily.    simvastatin (ZOCOR) 10 MG tablet Take 10 mg by mouth every evening.        Allergies  Allergen Reactions  . Chocolate Other (See Comments)    migraine  . Pollen Extract     sneezing   Follow-up Information    Follow up with Ardis Hughs, MD In 6 months.   Specialty:  Urology   Why:  with a repeat CT scan (done in urology office) before the appointment.  Someone will contact patient to set up appointment.   Contact information:   [address] [place] [address] [phone]       Follow up with [facility].   Specialty:  Rehabilitation   Why:  for vestibular physical therapy, they will contact you at home   Contact information:   [address] [place] [id number] [place] [phone]      Follow up with Vidal Schwalbe, MD In 2 weeks.   Specialty:  Family Medicine   Contact information:   [address] [place] [address] [phone]        The results of significant diagnostics from this hospitalization (including imaging, microbiology, ancillary and laboratory) are listed below for reference.    Significant Diagnostic Studies: Dg Chest 2 View  11/27/2014   CLINICAL DATA:  Left arm pain into the chest  EXAM: CHEST  2 VIEW  COMPARISON:  12/08/2013  FINDINGS: Biventricular pacer from  the right with duplicated right ventricular leads, stable from previous. Normal heart size and stable aortic/ hilar contours.  Linear opacities at the right base, new from prior. There is no edema, consolidation, effusion, or pneumothorax.  Remote left-sided rib fractures.  Splenomegaly, medially displacing the stomach. This is known from abdominal radiograph 2 days ago  IMPRESSION: Mild atelectasis at the right base.  No pneumonia or edema.   Electronically Signed   By: Monte Fantasia M.D.   On: 11/27/2014 13:27   Ct Angio Chest Pe W/cm &/or Wo Cm  12/05/2014   CLINICAL DATA:  Abdominal pain radiating to the left hip and groin. Known blood clots. Left lower extremity swelling.  EXAM: CT ANGIOGRAPHY CHEST  CT ABDOMEN AND PELVIS WITH CONTRAST  TECHNIQUE: Multidetector CT imaging of the chest was performed using the standard protocol during bolus administration of intravenous contrast. Multiplanar CT image reconstructions and MIPs were obtained to evaluate the vascular anatomy. Multidetector CT imaging of the abdomen and pelvis was performed using the standard protocol during bolus administration of intravenous contrast.  CONTRAST:  39mL OMNIPAQUE IOHEXOL 300 MG/ML SOLN, 49mL OMNIPAQUE IOHEXOL 350 MG/ML SOLN  COMPARISON:  11/27/2014 chest CT  FINDINGS: CTA CHEST FINDINGS  THORACIC INLET/BODY WALL:  Dual-chamber pacer from the right in unremarkable position.  MEDIASTINUM:  Significant decrease in the bilateral pulmonary emboli, with nonocclusive clot now seen in the posterior segment right upper lobe segmental arteries and basilar subsegmental vessels to the right lower lobe. There is no evidence of right heart strain.  No acute aortic findings.  Chronic cardiomegaly.  No pericardial effusion.  LUNG WINDOWS:  Mild dependent atelectasis, greater in the left lower lobe. Trace bilateral pleural effusion. No evidence of edema or pneumonia.  Small pulmonary nodules, including 3 mm nodule in the right apical lung on  image 48, too small for imaging follow-up recommendations in this never smoker.  OSSEOUS:  Remote healed left posterior rib fractures.  No acute findings.  CT ABDOMEN and PELVIS FINDINGS  BODY WALL: No contributory findings.  Liver: Numerous circumscribed low-density hepatic lesions consistent with cysts, largest in segment 3 measuring 18 mm.  Biliary: No evidence of biliary obstruction or stone.  Pancreas: Pancreas divisum. No evidence of acute or remote inflammation.  Spleen: Splenomegaly with 14 cm AP dimension and mild heterogeneous enhancement. This is presumably related to the patient's history of polycythemia.  Adrenals: Unremarkable.  Kidneys and ureters: Numerous bilateral renal cortical lesions consistent with cysts. Some are complicated, including a 39 mm cyst exophytic from the lower pole right kidney which has benign mural calcification. There are 2 dense renal lesions within the posterior left kidney, both measuring near 1 cm and present on images 41 and 45. Due to small size, these are difficult to further characterize.  No hydronephrosis or stone.  Bladder: Unremarkable.  Reproductive: No pathologic findings.  Bowel: Distal colonic diverticulosis. No active inflammation. No bowel obstruction. Appendectomy. No pericecal inflammatory change.  Retroperitoneum: No mass or adenopathy.  Peritoneum: Trace pelvic fluid which could be from volume overload.  Vascular: No acute abnormality.  OSSEOUS: No acute abnormalities.  Advanced lower lumbar facet arthritis with L4-5 grade 1 anterolisthesis. Facet overgrowth causes advanced canal stenosis at L3-4.  Review of the MIP images confirms the above findings.  IMPRESSION: 1. Decrease in pulmonary artery clot burden compared to 11/27/2014. No evidence of interval pulmonary embolism. 2. No explanation for acute abdominal pain. 3. Bibasilar atelectasis with trace effusion on the left. 4. Chronic splenomegaly, likely from patient's history of polycythemia vera. 5.  Numerous bilateral renal cysts. There are also 2 approximately 10 mm complex cysts or solid tumors within the left kidney. Further evaluation would require enhanced MRI, as noted on CT 11/27/2014. 6. Other incidental findings are noted above, and stable from prior   Electronically Signed   By: Monte Fantasia M.D.   On: 12/05/2014 13:02   Ct Angio Chest Pe W/cm &/or Wo Cm  11/27/2014   CLINICAL DATA:  Left arm pain radiating into the chest. Constipation. Aching and pain with recent left flank pain  EXAM: CT ANGIOGRAPHY CHEST  CT ABDOMEN AND PELVIS WITH CONTRAST  TECHNIQUE: Multidetector CT imaging of the chest was performed using the standard protocol during bolus administration of intravenous contrast. Multiplanar CT image reconstructions and MIPs were obtained to evaluate the vascular anatomy. Multidetector CT imaging of the abdomen and pelvis was performed using the standard protocol during bolus administration of intravenous contrast.  CONTRAST:  151mL OMNIPAQUE IOHEXOL 350 MG/ML SOLN  COMPARISON:  Multiple exams, including 11/27/2014; 03/30/2009; 03/22/2013  FINDINGS: Despite efforts by the technologist and patient, motion artifact is present on today's exam and could not be eliminated. This reduces exam sensitivity and specificity.  CT CHEST FINDINGS  Mediastinum/Nodes: Bilateral acute pulmonary embolus extending in the right pulmonary artery, right upper lobe pulmonary artery, right lower lobe pulmonary artery, segmental branches in the left upper lobe, and subsegmental branches in the left lower lobe. RV:LV ratio 0.78, within normal limits.  Coronary, aortic arch, and branch vessel atherosclerotic vascular disease. Moderate cardiomegaly.  Calcification of the aortic and mitral valves.  Lungs/Pleura: Trace left pleural effusion. Mild biapical pleural parenchymal scarring. Subsegmental atelectasis or scarring along both hemidiaphragms.  Musculoskeletal: Old healed left posterior rib fractures.  CT ABDOMEN  PELVIS FINDINGS  Hepatobiliary: Bilobed 1.0 by 0.8 cm fluid density lesion anteriorly in segment 8 of the liver, similar to 2010. Other fluid density lesions are present in the liver including a 1.9 by 1.2 cm cyst in segment 3. Some of the lesions are technically too small to characterize although statistically likely to be cysts.  Pancreas: Dorsal pancreatic duct  2.5 mm, type 1 pancreas divisum.  Spleen: Splenomegaly observed, splenic volume 911 cc. Small accessory spleen.  Adrenals/Urinary Tract: Scattered Bosniak category 1 and category 2 cysts are present in the kidneys, some too small to characterize. AA 4.2 by 3.3 cm exophytic cystic lesion from the left kidney lower pole has faint calcification along its posterior margin, images 44-46 series 10. Some of the lesions are somewhat hyperdense, including a 1.0 cm lesion on image 40 of series 11 which is subject to volume averaging making it difficult to compare portal venous and delayed phase images, and accordingly a small renal mass is difficult to confidently exclude. Several other mildly hyperdense lesions are similarly probably complex cysts but technically nonspecific.  Stomach/Bowel: Prominent sigmoid colon diverticulosis with scattered diverticula in the remainder the colon. No dilated bowel. New there is some mild stranding along the left paracolic gutter and splenic flexure but without definite findings of acute diverticulitis.  Vascular/Lymphatic: Aortoiliac atherosclerotic vascular disease.  Reproductive: Calcification posteriorly in the myometrium, likely a small fibroid, and only measuring 6 mm in diameter.  Other: No supplemental non-categorized findings.  Musculoskeletal: Chronic widening of the sacroiliac joints. Transitional L5 vertebra. Considerable facet arthropathy at multiple levels, particularly L4-5, with 6 mm anterolisthesis at L4-5 and resulting mild right foraminal impingement. Suspected central narrowing of the thecal sac at L4-5 due to  facet arthropathy and disc bulge.  Chondrocalcinosis of the pubic symphysis.  Review of the MIP images confirms the above findings.  IMPRESSION: 1. Bilateral acute pulmonary embolus, moderate clot burden, right ventricular to left ventricular ratio within normal limits. 2. Multiple bilateral renal cysts. 3. There also hyperdense renal lesions which are technically indeterminate on today' s CT scan for solid versus cystic lesions. Consider renal protocol MRI with and without contrast in the nonacute setting for further workup, to ensure that none of these represent renal tumors. 4. Considerable splenomegaly, cause uncertain. I do not see other adenopathy in the chest, abdomen, or pelvis. 5. Numerous other findings including atherosclerosis, cardiomegaly, calcified aortic and mitral valves, trace left pleural effusion, hepatic cysts, type 1 pancreas divisum, prominent diverticulosis, small calcified uterine fibroid, chronically widened sacroiliac joints, lumbar spondylosis and degenerative disc disease causing impingement, chondrocalcinosis of the pubic symphysis, and low-level stranding along the left paracolic gutter and splenic flexure but without definite findings of diverticulitis or other explaining cause. Critical Value/emergent results were called by telephone at the time of interpretation on 11/27/2014 at 2:35 pm to Dr. Roxanne Mins , who verbally acknowledged these results.   Electronically Signed   By: Van Clines M.D.   On: 11/27/2014 16:26   Ct Abdomen Pelvis W Contrast  12/05/2014   CLINICAL DATA:  Abdominal pain radiating to the left hip and groin. Known blood clots. Left lower extremity swelling.  EXAM: CT ANGIOGRAPHY CHEST  CT ABDOMEN AND PELVIS WITH CONTRAST  TECHNIQUE: Multidetector CT imaging of the chest was performed using the standard protocol during bolus administration of intravenous contrast. Multiplanar CT image reconstructions and MIPs were obtained to evaluate the vascular anatomy.  Multidetector CT imaging of the abdomen and pelvis was performed using the standard protocol during bolus administration of intravenous contrast.  CONTRAST:  94mL OMNIPAQUE IOHEXOL 300 MG/ML SOLN, 12mL OMNIPAQUE IOHEXOL 350 MG/ML SOLN  COMPARISON:  11/27/2014 chest CT  FINDINGS: CTA CHEST FINDINGS  THORACIC INLET/BODY WALL:  Dual-chamber pacer from the right in unremarkable position.  MEDIASTINUM:  Significant decrease in the bilateral pulmonary emboli, with nonocclusive clot now seen in the posterior  segment right upper lobe segmental arteries and basilar subsegmental vessels to the right lower lobe. There is no evidence of right heart strain.  No acute aortic findings.  Chronic cardiomegaly.  No pericardial effusion.  LUNG WINDOWS:  Mild dependent atelectasis, greater in the left lower lobe. Trace bilateral pleural effusion. No evidence of edema or pneumonia.  Small pulmonary nodules, including 3 mm nodule in the right apical lung on image 48, too small for imaging follow-up recommendations in this never smoker.  OSSEOUS:  Remote healed left posterior rib fractures.  No acute findings.  CT ABDOMEN and PELVIS FINDINGS  BODY WALL: No contributory findings.  Liver: Numerous circumscribed low-density hepatic lesions consistent with cysts, largest in segment 3 measuring 18 mm.  Biliary: No evidence of biliary obstruction or stone.  Pancreas: Pancreas divisum. No evidence of acute or remote inflammation.  Spleen: Splenomegaly with 14 cm AP dimension and mild heterogeneous enhancement. This is presumably related to the patient's history of polycythemia.  Adrenals: Unremarkable.  Kidneys and ureters: Numerous bilateral renal cortical lesions consistent with cysts. Some are complicated, including a 39 mm cyst exophytic from the lower pole right kidney which has benign mural calcification. There are 2 dense renal lesions within the posterior left kidney, both measuring near 1 cm and present on images 41 and 45. Due to small  size, these are difficult to further characterize.  No hydronephrosis or stone.  Bladder: Unremarkable.  Reproductive: No pathologic findings.  Bowel: Distal colonic diverticulosis. No active inflammation. No bowel obstruction. Appendectomy. No pericecal inflammatory change.  Retroperitoneum: No mass or adenopathy.  Peritoneum: Trace pelvic fluid which could be from volume overload.  Vascular: No acute abnormality.  OSSEOUS: No acute abnormalities.  Advanced lower lumbar facet arthritis with L4-5 grade 1 anterolisthesis. Facet overgrowth causes advanced canal stenosis at L3-4.  Review of the MIP images confirms the above findings.  IMPRESSION: 1. Decrease in pulmonary artery clot burden compared to 11/27/2014. No evidence of interval pulmonary embolism. 2. No explanation for acute abdominal pain. 3. Bibasilar atelectasis with trace effusion on the left. 4. Chronic splenomegaly, likely from patient's history of polycythemia vera. 5. Numerous bilateral renal cysts. There are also 2 approximately 10 mm complex cysts or solid tumors within the left kidney. Further evaluation would require enhanced MRI, as noted on CT 11/27/2014. 6. Other incidental findings are noted above, and stable from prior   Electronically Signed   By: Monte Fantasia M.D.   On: 12/05/2014 13:02   Ct Abdomen Pelvis W Contrast  11/27/2014   CLINICAL DATA:  Left arm pain radiating into the chest. Constipation. Aching and pain with recent left flank pain  EXAM: CT ANGIOGRAPHY CHEST  CT ABDOMEN AND PELVIS WITH CONTRAST  TECHNIQUE: Multidetector CT imaging of the chest was performed using the standard protocol during bolus administration of intravenous contrast. Multiplanar CT image reconstructions and MIPs were obtained to evaluate the vascular anatomy. Multidetector CT imaging of the abdomen and pelvis was performed using the standard protocol during bolus administration of intravenous contrast.  CONTRAST:  168mL OMNIPAQUE IOHEXOL 350 MG/ML SOLN   COMPARISON:  Multiple exams, including 11/27/2014; 03/30/2009; 03/22/2013  FINDINGS: Despite efforts by the technologist and patient, motion artifact is present on today's exam and could not be eliminated. This reduces exam sensitivity and specificity.  CT CHEST FINDINGS  Mediastinum/Nodes: Bilateral acute pulmonary embolus extending in the right pulmonary artery, right upper lobe pulmonary artery, right lower lobe pulmonary artery, segmental branches in the left upper lobe, and  subsegmental branches in the left lower lobe. RV:LV ratio 0.78, within normal limits.  Coronary, aortic arch, and branch vessel atherosclerotic vascular disease. Moderate cardiomegaly.  Calcification of the aortic and mitral valves.  Lungs/Pleura: Trace left pleural effusion. Mild biapical pleural parenchymal scarring. Subsegmental atelectasis or scarring along both hemidiaphragms.  Musculoskeletal: Old healed left posterior rib fractures.  CT ABDOMEN PELVIS FINDINGS  Hepatobiliary: Bilobed 1.0 by 0.8 cm fluid density lesion anteriorly in segment 8 of the liver, similar to 2010. Other fluid density lesions are present in the liver including a 1.9 by 1.2 cm cyst in segment 3. Some of the lesions are technically too small to characterize although statistically likely to be cysts.  Pancreas: Dorsal pancreatic duct 2.5 mm, type 1 pancreas divisum.  Spleen: Splenomegaly observed, splenic volume 911 cc. Small accessory spleen.  Adrenals/Urinary Tract: Scattered Bosniak category 1 and category 2 cysts are present in the kidneys, some too small to characterize. AA 4.2 by 3.3 cm exophytic cystic lesion from the left kidney lower pole has faint calcification along its posterior margin, images 44-46 series 10. Some of the lesions are somewhat hyperdense, including a 1.0 cm lesion on image 40 of series 11 which is subject to volume averaging making it difficult to compare portal venous and delayed phase images, and accordingly a small renal mass is  difficult to confidently exclude. Several other mildly hyperdense lesions are similarly probably complex cysts but technically nonspecific.  Stomach/Bowel: Prominent sigmoid colon diverticulosis with scattered diverticula in the remainder the colon. No dilated bowel. New there is some mild stranding along the left paracolic gutter and splenic flexure but without definite findings of acute diverticulitis.  Vascular/Lymphatic: Aortoiliac atherosclerotic vascular disease.  Reproductive: Calcification posteriorly in the myometrium, likely a small fibroid, and only measuring 6 mm in diameter.  Other: No supplemental non-categorized findings.  Musculoskeletal: Chronic widening of the sacroiliac joints. Transitional L5 vertebra. Considerable facet arthropathy at multiple levels, particularly L4-5, with 6 mm anterolisthesis at L4-5 and resulting mild right foraminal impingement. Suspected central narrowing of the thecal sac at L4-5 due to facet arthropathy and disc bulge.  Chondrocalcinosis of the pubic symphysis.  Review of the MIP images confirms the above findings.  IMPRESSION: 1. Bilateral acute pulmonary embolus, moderate clot burden, right ventricular to left ventricular ratio within normal limits. 2. Multiple bilateral renal cysts. 3. There also hyperdense renal lesions which are technically indeterminate on today' s CT scan for solid versus cystic lesions. Consider renal protocol MRI with and without contrast in the nonacute setting for further workup, to ensure that none of these represent renal tumors. 4. Considerable splenomegaly, cause uncertain. I do not see other adenopathy in the chest, abdomen, or pelvis. 5. Numerous other findings including atherosclerosis, cardiomegaly, calcified aortic and mitral valves, trace left pleural effusion, hepatic cysts, type 1 pancreas divisum, prominent diverticulosis, small calcified uterine fibroid, chronically widened sacroiliac joints, lumbar spondylosis and degenerative  disc disease causing impingement, chondrocalcinosis of the pubic symphysis, and low-level stranding along the left paracolic gutter and splenic flexure but without definite findings of diverticulitis or other explaining cause. Critical Value/emergent results were called by telephone at the time of interpretation on 11/27/2014 at 0:35 pm to Dr. Roxanne Mins , who verbally acknowledged these results.   Electronically Signed   By: Van Clines M.D.   On: 11/27/2014 16:26   Dg Shoulder Left  11/27/2014   CLINICAL DATA:  Persistent chronic left shoulder pain for 1 month. Initial encounter.  EXAM: LEFT SHOULDER - 2+  VIEW  COMPARISON:  None.  FINDINGS: There is no evidence of fracture or dislocation. The left humeral head is seated within the glenoid fossa. The acromioclavicular joint is unremarkable in appearance. No significant soft tissue abnormalities are seen. The visualized portions of the left lung are clear.  IMPRESSION: No evidence of fracture or dislocation.   Electronically Signed   By: Garald Balding M.D.   On: 11/27/2014 20:41   Dg Abd 2 Views  11/25/2014   CLINICAL DATA:  Abdominal pain for 4 days  EXAM: ABDOMEN - 2 VIEW  COMPARISON:  None.  FINDINGS: Scattered large and small bowel gas is noted. Fecal material is noted throughout the colon although no obstructive changes are seen. The spleen is enlarged of uncertain significance. No acute bony abnormality is seen. No obstructive changes are noted.  IMPRESSION: Splenomegaly.  Otherwise nonspecific abdomen.   Electronically Signed   By: Inez Catalina M.D.   On: 11/25/2014 14:24    Microbiology: No results found for this or any previous visit (from the past 240 hour(s)).   Labs: Basic Metabolic Panel:  Recent Labs Lab 12/05/14 1100  NA 140  K 4.3  CL 106  CO2 26  GLUCOSE 100*  BUN 18  CREATININE 0.80  CALCIUM 8.8*   Liver Function Tests: No results for input(s): AST, ALT, ALKPHOS, BILITOT, PROT, ALBUMIN in the last 168 hours. No  results for input(s): LIPASE, AMYLASE in the last 168 hours. No results for input(s): AMMONIA in the last 168 hours. CBC:  Recent Labs Lab 12/01/14 0330 12/05/14 1100  WBC 22.8* 23.6*  HGB 12.4 12.7  HCT 41.8 42.3  MCV 65.4* 63.8*  PLT 188 233   Cardiac Enzymes: No results for input(s): CKTOTAL, CKMB, CKMBINDEX, TROPONINI in the last 168 hours. BNP: BNP (last 3 results)  Recent Labs  12/05/14 1100  BNP 214.4*    ProBNP (last 3 results) No results for input(s): PROBNP in the last 8760 hours.  CBG:  Recent Labs Lab 12/05/14 2139 12/06/14 0821 12/06/14 1151 12/06/14 1746 12/06/14 2350  GLUCAP 146* 81 87 95 121*       Signed:  Savyon Loken  [facility] 12/07/2014, 9:37 AM

## 2014-12-07 NOTE — Progress Notes (Signed)
NURSING PROGRESS NOTE  [name] [id number] Discharge Data: 12/07/2014 12:35 PM Attending Provider: No att. providers found KBT:CYELY,HTMBPJP S, MD     Mable Fill to be D/C'd Home per MD order.  Discussed with the patient the After Visit Summary and all questions fully answered. All IV's discontinued with no bleeding noted. All belongings returned to patient for patient to take home.   Last Vital Signs:  Blood pressure 146/56, pulse 66, temperature 98 F (36.7 C), temperature source Oral, resp. rate 18, height 5\' 2"  (1.575 m), weight 54.613 kg (120 lb 6.4 oz), SpO2 98 %.  Discharge Medication List   Medication List    TAKE these medications        acetaminophen 325 MG tablet  Commonly known as:  TYLENOL  Take 2 tablets (650 mg total) by mouth every 6 (six) hours as needed for mild pain (temperature >/= 99.5 F).     amLODipine 5 MG tablet  Commonly known as:  NORVASC  Take 5 mg by mouth daily.     apixaban 5 MG Tabs tablet  Commonly known as:  ELIQUIS  Take 1 tablet (5 mg total) by mouth 2 (two) times daily.     beta carotene w/minerals tablet  Take 1 tablet by mouth daily.     calcium-vitamin D 500-200 MG-UNIT per tablet  Commonly known as:  OSCAL WITH D  Take 2 tablets by mouth daily.     hydroxyurea 500 MG capsule  Commonly known as:  HYDREA  TAKE 1 CAPSULE (500 MG TOTAL) BY MOUTH ONCE A WEEK. MAY TAKE WITH FOOD TO MINIMIZE GI SIDE EFFECTS.     pantoprazole 40 MG tablet  Commonly known as:  PROTONIX  Take 1 tablet (40 mg total) by mouth daily. Switch for any other PPI at similar dose and frequency     pyridOXINE 100 MG tablet  Commonly known as:  VITAMIN B-6  Take 100 mg by mouth daily.     simvastatin 10 MG tablet  Commonly known as:  ZOCOR  Take 10 mg by mouth every evening.     SYSTANE ULTRA OP  Apply 1 drop to eye 2 (two) times daily.

## 2014-12-07 NOTE — Care Management Note (Signed)
Case Management Note  Patient Details  Name: KRISTILYN COLTRANE MRN: [id number] Date of Birth: 06/17/1922  Subjective/Objective:    Patient discharged home today, with a referral for outpatient vestibular therapy, patient states she does not think she will do this, NCM informed her that they will call her anyway and maybe she will change her mind.                 Action/Plan:   Expected Discharge Date:                  Expected Discharge Plan:  Home/Self Care  In-House Referral:     Discharge planning Services  CM Consult  Post Acute Care Choice:    Choice offered to:     DME Arranged:    DME Agency:     HH Arranged:    [place] Agency:     Status of Service:  Completed, signed off  Medicare Important Message Given:  No Date Medicare IM Given:    Medicare IM give by:    Date Additional Medicare IM Given:    Additional Medicare Important Message give by:     If discussed at [place] of Stay Meetings, dates discussed:    Additional Comments:  Zenon Mayo, RN 12/07/2014, 1:13 PM

## 2014-12-12 ENCOUNTER — Ambulatory Visit (HOSPITAL_BASED_OUTPATIENT_CLINIC_OR_DEPARTMENT_OTHER): Payer: Medicare Other | Admitting: Oncology

## 2014-12-12 ENCOUNTER — Other Ambulatory Visit (HOSPITAL_BASED_OUTPATIENT_CLINIC_OR_DEPARTMENT_OTHER): Payer: Medicare Other

## 2014-12-12 ENCOUNTER — Other Ambulatory Visit: Payer: Self-pay | Admitting: *Deleted

## 2014-12-12 ENCOUNTER — Ambulatory Visit (HOSPITAL_BASED_OUTPATIENT_CLINIC_OR_DEPARTMENT_OTHER): Payer: Medicare Other

## 2014-12-12 ENCOUNTER — Telehealth: Payer: Self-pay | Admitting: Oncology

## 2014-12-12 VITALS — BP 125/43 | HR 79 | Temp 98.2°F | Resp 18 | Ht 62.0 in | Wt 123.2 lb

## 2014-12-12 DIAGNOSIS — D45 Polycythemia vera: Secondary | ICD-10-CM

## 2014-12-12 DIAGNOSIS — I2699 Other pulmonary embolism without acute cor pulmonale: Secondary | ICD-10-CM | POA: Diagnosis not present

## 2014-12-12 DIAGNOSIS — D508 Other iron deficiency anemias: Secondary | ICD-10-CM | POA: Diagnosis not present

## 2014-12-12 DIAGNOSIS — Z7901 Long term (current) use of anticoagulants: Secondary | ICD-10-CM

## 2014-12-12 DIAGNOSIS — R600 Localized edema: Secondary | ICD-10-CM | POA: Diagnosis not present

## 2014-12-12 LAB — CBC WITH DIFFERENTIAL/PLATELET
BASO%: 0.6 % (ref 0.0–2.0)
BASOS ABS: 0.1 10*3/uL (ref 0.0–0.1)
EOS ABS: 1 10*3/uL — AB (ref 0.0–0.5)
EOS%: 4.4 % (ref 0.0–7.0)
HCT: 45.2 % (ref 34.8–46.6)
HEMOGLOBIN: 13.5 g/dL (ref 11.6–15.9)
LYMPH%: 6.2 % — ABNORMAL LOW (ref 14.0–49.7)
MCH: 19.4 pg — ABNORMAL LOW (ref 25.1–34.0)
MCHC: 29.9 g/dL — ABNORMAL LOW (ref 31.5–36.0)
MCV: 65 fL — ABNORMAL LOW (ref 79.5–101.0)
MONO#: 1.8 10*3/uL — AB (ref 0.1–0.9)
MONO%: 7.7 % (ref 0.0–14.0)
NEUT#: 19 10*3/uL — ABNORMAL HIGH (ref 1.5–6.5)
NEUT%: 81.1 % — ABNORMAL HIGH (ref 38.4–76.8)
Platelets: 304 10*3/uL (ref 145–400)
RBC: 6.95 10*6/uL — ABNORMAL HIGH (ref 3.70–5.45)
RDW: 24.6 % — AB (ref 11.2–14.5)
WBC: 23.4 10*3/uL — AB (ref 3.9–10.3)
lymph#: 1.5 10*3/uL (ref 0.9–3.3)
nRBC: 1 % — ABNORMAL HIGH (ref 0–0)

## 2014-12-12 MED ORDER — ACETAMINOPHEN 325 MG PO TABS: 650.0000 mg | ORAL_TABLET | Freq: Once | ORAL | Status: DC

## 2014-12-12 NOTE — Progress Notes (Signed)
At 1210, VSS,denies distress. Pt left unit via walker assistance

## 2014-12-12 NOTE — Telephone Encounter (Signed)
Per staff message and POF I have scheduled appts. Advised scheduler of appts. JMW  

## 2014-12-12 NOTE — Telephone Encounter (Signed)
Gave and printed appt sched and avs fo rpt gfor [place]

## 2014-12-12 NOTE — Progress Notes (Signed)
Phlebotomy performed via left AC, 250 cc blood removed. Dr. Benay Spice notified and ok for only 250 removed.

## 2014-12-12 NOTE — Patient Instructions (Signed)

## 2014-12-12 NOTE — Progress Notes (Signed)
  [place] OFFICE PROGRESS NOTE   Diagnosis: Polycythemia vera  INTERVAL HISTORY:   Ms. Weesner returns for a scheduled visit. She reports left shoulder pain prompting a CT of the chest where she was diagnosed with bilateral pulmonary emboli on 11/27/2014. She is now maintained on anticoagulation therapy. She will be admitted 12/05/2014 with increased dyspnea. A repeat CT of the chest revealed improvement in the pulmonary emboli.  She continues to have left-sided chest and upper abdominal discomfort that is worse with inspiration. She is not taking pain medication. She continues anticoagulation therapy. She remains on hydroxyurea.     Objective:  Vital signs in last 24 hours:  Blood pressure 125/43, pulse 79, temperature 98.2 F (36.8 C), temperature source Oral, resp. rate 18, height 5\' 2"  (1.575 m), weight 123 lb 3.2 oz (55.883 kg), SpO2 96 %.     Resp: Decreased breath sounds at the bases, no respiratory distress Cardio: Regular rate and rhythm GI: The spleen tip is palpable in the left subcostal region with associated tenderness. Vascular: Trace edema at the left lower leg, no erythema   Lab Results:  Lab Results  Component Value Date   WBC 23.4* 12/12/2014   HGB 13.5 12/12/2014   HCT 45.2 12/12/2014   MCV 65.0* 12/12/2014   PLT 304 12/12/2014   NEUTROABS 19.0* 12/12/2014    Medications: I have reviewed the patient's current medications.  Assessment/Plan: 1. Polycythemia vera-JAK-2 positive. 2. Iron deficiency secondary to phlebotomy. 3. Bilateral pulmonary embolism 11/27/2014-maintained on  apixaban 4. Left lower chest/upper abdominal pain-likely proceed related to pulmonary emboli versus painful splenomegaly 5. Indeterminate solid lesions in the left kidney on CT 12/05/2014-evaluated by urology   Disposition:  She has polycythemia vera. The recent pulmonary emboli may be related to the polycythemia. I recommend phlebotomy therapy with a  goal hematocrit of less than 43%. She will continue hydroxyurea and will undergo phlebotomy today.  I suspect she may have a left lower extremity DVT based on the left leg swelling today. This is being treated with apixaban. The left chest/upper abdominal discomfort should improve if related to the pulmonary emboli.  Ms. Gladis Riffle will return for an office visit in 3 weeks.  Betsy Coder, MD  12/12/2014  10:53 AM

## 2014-12-13 DIAGNOSIS — K922 Gastrointestinal hemorrhage, unspecified: Secondary | ICD-10-CM | POA: Diagnosis not present

## 2014-12-13 DIAGNOSIS — R5383 Other fatigue: Secondary | ICD-10-CM | POA: Diagnosis not present

## 2014-12-13 DIAGNOSIS — R161 Splenomegaly, not elsewhere classified: Secondary | ICD-10-CM | POA: Diagnosis not present

## 2014-12-13 DIAGNOSIS — I2699 Other pulmonary embolism without acute cor pulmonale: Secondary | ICD-10-CM | POA: Diagnosis not present

## 2014-12-13 DIAGNOSIS — D45 Polycythemia vera: Secondary | ICD-10-CM | POA: Diagnosis not present

## 2014-12-19 ENCOUNTER — Telehealth: Payer: Self-pay | Admitting: Internal Medicine

## 2014-12-19 ENCOUNTER — Ambulatory Visit (INDEPENDENT_AMBULATORY_CARE_PROVIDER_SITE_OTHER): Payer: Medicare Other | Admitting: *Deleted

## 2014-12-19 DIAGNOSIS — I442 Atrioventricular block, complete: Secondary | ICD-10-CM

## 2014-12-19 NOTE — Telephone Encounter (Signed)
LMOVM for pt to return call 

## 2014-12-19 NOTE — Telephone Encounter (Signed)
New Message  Pt calling to speak w/ Device to see if remote transmission was successful. If not, pt daughter wanted to know what to do to send transmission. Please call back and disucss.

## 2014-12-19 NOTE — Telephone Encounter (Signed)
Follow  Up   [name] is calling in to speak to Rn about what to do about pt device and   If it needs to be done from home today or can it be done on another day when her brother is present.

## 2014-12-20 DIAGNOSIS — I442 Atrioventricular block, complete: Secondary | ICD-10-CM | POA: Diagnosis not present

## 2014-12-20 NOTE — Telephone Encounter (Signed)
Spoke w/ pt and informed her that it is ok to check pacemaker from home today. Pt verbalized understanding.

## 2014-12-20 NOTE — Progress Notes (Signed)
Remote pacemaker transmission.   

## 2014-12-25 LAB — CUP PACEART REMOTE DEVICE CHECK
Battery Impedance: 1106 Ohm
Battery Voltage: 2.78 V
Brady Statistic AP VS Percent: 0 %
Brady Statistic AS VP Percent: 69 %
Lead Channel Impedance Value: 376 Ohm
Lead Channel Impedance Value: 467 Ohm
Lead Channel Setting Pacing Amplitude: 2 V
Lead Channel Setting Pacing Pulse Width: 0.4 ms
Lead Channel Setting Sensing Sensitivity: 5.6 mV
MDC IDC MSMT BATTERY REMAINING LONGEVITY: 47 mo
MDC IDC MSMT LEADCHNL RA SENSING INTR AMPL: 2.8 mV
MDC IDC SESS DTM: 20160706162401
MDC IDC SET LEADCHNL RV PACING AMPLITUDE: 2.5 V
MDC IDC STAT BRADY AP VP PERCENT: 29 %
MDC IDC STAT BRADY AS VS PERCENT: 2 %

## 2014-12-27 ENCOUNTER — Encounter: Payer: Self-pay | Admitting: Cardiology

## 2015-01-02 ENCOUNTER — Other Ambulatory Visit (HOSPITAL_BASED_OUTPATIENT_CLINIC_OR_DEPARTMENT_OTHER): Payer: Medicare Other

## 2015-01-02 ENCOUNTER — Telehealth: Payer: Self-pay | Admitting: Nurse Practitioner

## 2015-01-02 ENCOUNTER — Ambulatory Visit (HOSPITAL_BASED_OUTPATIENT_CLINIC_OR_DEPARTMENT_OTHER): Payer: Medicare Other | Admitting: Nurse Practitioner

## 2015-01-02 VITALS — BP 165/75 | HR 82 | Temp 97.9°F | Resp 18 | Ht 62.0 in | Wt 123.5 lb

## 2015-01-02 DIAGNOSIS — E611 Iron deficiency: Secondary | ICD-10-CM

## 2015-01-02 DIAGNOSIS — Z7901 Long term (current) use of anticoagulants: Secondary | ICD-10-CM | POA: Diagnosis not present

## 2015-01-02 DIAGNOSIS — I2699 Other pulmonary embolism without acute cor pulmonale: Secondary | ICD-10-CM | POA: Diagnosis not present

## 2015-01-02 DIAGNOSIS — D45 Polycythemia vera: Secondary | ICD-10-CM

## 2015-01-02 LAB — CBC WITH DIFFERENTIAL/PLATELET
BASO%: 1 % (ref 0.0–2.0)
Basophils Absolute: 0.2 10*3/uL — ABNORMAL HIGH (ref 0.0–0.1)
EOS%: 4.7 % (ref 0.0–7.0)
Eosinophils Absolute: 1.1 10*3/uL — ABNORMAL HIGH (ref 0.0–0.5)
HCT: 44 % (ref 34.8–46.6)
HGB: 13.1 g/dL (ref 11.6–15.9)
LYMPH#: 1.7 10*3/uL (ref 0.9–3.3)
LYMPH%: 7.7 % — ABNORMAL LOW (ref 14.0–49.7)
MCH: 19.1 pg — ABNORMAL LOW (ref 25.1–34.0)
MCHC: 29.8 g/dL — ABNORMAL LOW (ref 31.5–36.0)
MCV: 64.2 fL — ABNORMAL LOW (ref 79.5–101.0)
MONO#: 2 10*3/uL — ABNORMAL HIGH (ref 0.1–0.9)
MONO%: 8.9 % (ref 0.0–14.0)
NEUT#: 17.4 10*3/uL — ABNORMAL HIGH (ref 1.5–6.5)
NEUT%: 77.7 % — ABNORMAL HIGH (ref 38.4–76.8)
Platelets: 214 10*3/uL (ref 145–400)
RBC: 6.85 10*6/uL — AB (ref 3.70–5.45)
RDW: 24.2 % — AB (ref 11.2–14.5)
WBC: 22.4 10*3/uL — AB (ref 3.9–10.3)
nRBC: 1 % — ABNORMAL HIGH (ref 0–0)

## 2015-01-02 NOTE — Telephone Encounter (Signed)
per pof to sch pt appt-gave pt copy of avs °

## 2015-01-02 NOTE — Patient Instructions (Signed)
Change Hydrea to 500 mg twice a week (Sunday and Thursday)

## 2015-01-02 NOTE — Progress Notes (Signed)
  [place] OFFICE PROGRESS NOTE   Diagnosis:  Polycythemia vera  INTERVAL HISTORY:   [name] returns as scheduled. The left shoulder pain has nearly resolved. Dyspnea is better as well. She denies any bleeding. She has intermittent swelling of the left leg.  Objective:  Vital signs in last 24 hours:  Blood pressure 165/75, pulse 82, temperature 97.9 F (36.6 C), temperature source Oral, resp. rate 18, height 5\' 2"  (1.575 m), weight 123 lb 8 oz (56.019 kg), SpO2 98 %.    HEENT: No thrush or ulcers. Resp: Lungs clear bilaterally. Breath sounds diminished mildly at the left base. No respiratory distress. Cardio: Regular rate and rhythm. GI: Abdomen soft. Spleen palpable 2-3 fingerbreadths below the left costal margin with associated tenderness. Vascular: Trace lower leg edema bilaterally.   Lab Results:  Lab Results  Component Value Date   WBC 22.4* 01/02/2015   HGB 13.1 01/02/2015   HCT 44.0 01/02/2015   MCV 64.2* 01/02/2015   PLT 214 01/02/2015   NEUTROABS 17.4* 01/02/2015    Imaging:  No results found.  Medications: I have reviewed the patient's current medications.  Assessment/Plan: 1. Polycythemia vera-JAK-2 positive. 2. Iron deficiency secondary to phlebotomy. 3. Bilateral pulmonary embolism 11/27/2014-maintained on apixaban 4. Left lower chest/upper abdominal pain-likely proceed related to pulmonary emboli versus painful splenomegaly 5. Indeterminate solid lesions in the left kidney on CT 12/05/2014-evaluated by urology  Disposition: [name] appears stable. The hematocrit is slightly above goal range. Dr. Benay Spice recommends increasing the Hydrea from 500 mg once a week to 500 mg twice a week which she will take on Sundays and Thursdays. She will return for a CBC in 3 weeks. We will see her in follow-up in 6 weeks.  Plan reviewed with Dr. Benay Spice.  Ned Card ANP/GNP-BC   01/02/2015  10:35 AM

## 2015-01-03 ENCOUNTER — Encounter: Payer: Self-pay | Admitting: Internal Medicine

## 2015-01-23 ENCOUNTER — Other Ambulatory Visit: Payer: Medicare Other

## 2015-01-26 ENCOUNTER — Ambulatory Visit: Payer: Medicare Other | Admitting: Podiatry

## 2015-02-01 ENCOUNTER — Ambulatory Visit (INDEPENDENT_AMBULATORY_CARE_PROVIDER_SITE_OTHER): Payer: Medicare Other | Admitting: Podiatry

## 2015-02-01 ENCOUNTER — Encounter: Payer: Self-pay | Admitting: Podiatry

## 2015-02-01 DIAGNOSIS — M79676 Pain in unspecified toe(s): Secondary | ICD-10-CM | POA: Diagnosis not present

## 2015-02-01 DIAGNOSIS — B351 Tinea unguium: Secondary | ICD-10-CM

## 2015-02-01 NOTE — Progress Notes (Signed)
Patient ID: [name], female   DOB: 01/28/1923, 81 y.o.   MRN: [id number] Complaint:  Visit Type: Patient returns to my office for continued preventative foot care services. Complaint: Patient states" my nails have grown long and thick and become painful to walk and wear shoes" . The patient presents for preventative foot care services. No changes to ROS  Podiatric Exam: Vascular: dorsalis pedis and posterior tibial pulses are palpable bilateral. Capillary return is immediate. Temperature gradient is WNL. Skin turgor WNL  Sensorium: Normal Semmes Weinstein monofilament test. Normal tactile sensation bilaterally. Nail Exam: Pt has thick disfigured discolored nails with subungual debris noted bilateral entire nail hallux through fifth toenails Ulcer Exam: There is no evidence of ulcer or pre-ulcerative changes or infection. Orthopedic Exam: Muscle tone and strength are WNL. No limitations in general ROM. No crepitus or effusions noted. Foot type and digits show no abnormalities. Bony prominences are unremarkable. Skin: No Porokeratosis. No infection or ulcers  Diagnosis:  Onychomycosis, , Pain in right toe, pain in left toes  Treatment & Plan Procedures and Treatment: Consent by patient was obtained for treatment procedures. The patient understood the discussion of treatment and procedures well. All questions were answered thoroughly reviewed. Debridement of mycotic and hypertrophic toenails, 1 through 5 bilateral and clearing of subungual debris. No ulceration, no infection noted.  Return Visit-Office Procedure: Patient instructed to return to the office for a follow up visit 3 months for continued evaluation and treatment.

## 2015-02-13 ENCOUNTER — Telehealth: Payer: Self-pay | Admitting: Oncology

## 2015-02-13 ENCOUNTER — Other Ambulatory Visit (HOSPITAL_BASED_OUTPATIENT_CLINIC_OR_DEPARTMENT_OTHER): Payer: Medicare Other

## 2015-02-13 ENCOUNTER — Ambulatory Visit (HOSPITAL_BASED_OUTPATIENT_CLINIC_OR_DEPARTMENT_OTHER): Payer: Medicare Other | Admitting: Nurse Practitioner

## 2015-02-13 VITALS — BP 141/63 | HR 82 | Temp 98.7°F | Resp 20 | Ht 62.0 in | Wt 122.6 lb

## 2015-02-13 DIAGNOSIS — Z7901 Long term (current) use of anticoagulants: Secondary | ICD-10-CM | POA: Diagnosis not present

## 2015-02-13 DIAGNOSIS — D508 Other iron deficiency anemias: Secondary | ICD-10-CM

## 2015-02-13 DIAGNOSIS — D45 Polycythemia vera: Secondary | ICD-10-CM

## 2015-02-13 DIAGNOSIS — I2699 Other pulmonary embolism without acute cor pulmonale: Secondary | ICD-10-CM

## 2015-02-13 LAB — CBC WITH DIFFERENTIAL/PLATELET
BASO%: 0.6 % (ref 0.0–2.0)
BASOS ABS: 0.2 10*3/uL — AB (ref 0.0–0.1)
EOS%: 2.8 % (ref 0.0–7.0)
Eosinophils Absolute: 0.8 10*3/uL — ABNORMAL HIGH (ref 0.0–0.5)
HEMATOCRIT: 45.4 % (ref 34.8–46.6)
HGB: 13.7 g/dL (ref 11.6–15.9)
LYMPH%: 6.7 % — ABNORMAL LOW (ref 14.0–49.7)
MCH: 19.1 pg — AB (ref 25.1–34.0)
MCHC: 30.2 g/dL — AB (ref 31.5–36.0)
MCV: 63.4 fL — AB (ref 79.5–101.0)
MONO#: 2 10*3/uL — AB (ref 0.1–0.9)
MONO%: 6.8 % (ref 0.0–14.0)
NEUT#: 24.3 10*3/uL — ABNORMAL HIGH (ref 1.5–6.5)
NEUT%: 83.1 % — AB (ref 38.4–76.8)
Platelets: 241 10*3/uL (ref 145–400)
RBC: 7.16 10*6/uL — ABNORMAL HIGH (ref 3.70–5.45)
RDW: 24 % — ABNORMAL HIGH (ref 11.2–14.5)
WBC: 29.3 10*3/uL — ABNORMAL HIGH (ref 3.9–10.3)
lymph#: 2 10*3/uL (ref 0.9–3.3)
nRBC: 0 % (ref 0–0)

## 2015-02-13 MED ORDER — RIVAROXABAN 20 MG PO TABS: 20.0000 mg | ORAL_TABLET | Freq: Every day | ORAL | Status: DC

## 2015-02-13 NOTE — Progress Notes (Signed)
  [place] OFFICE PROGRESS NOTE   Diagnosis:  Polycythemia vera  INTERVAL HISTORY:   Ms. Rosales returns as scheduled. She continues Hydrea. The dose was adjusted from 500 mg once a week to 500 mg twice a week when she was here on 01/02/2015. Ms. Rolston is not sure if she increases dose of Hydrea following her last visit. She notes increased dyspnea periodically. She will feel more short of breath for a few days and then back to baseline. The dyspnea is not exertion related. She denies fever and cough. No chest pain. She continues Eliquis. She denies any bleeding. She would like to switch to a different anticoagulant due to the co-pay.  Objective:  Vital signs in last 24 hours:  Blood pressure 141/63, pulse 82, temperature 98.7 F (37.1 C), temperature source Oral, resp. rate 20, height 5\' 2"  (1.575 m), weight 122 lb 9.6 oz (55.611 kg), SpO2 97 %.    HEENT: No thrush or ulcers. Lymphatics: No palpable cervical or supraclavicular lymph nodes. Resp: Faint inspiratory rales at both lung bases. No respiratory distress. Cardio: Regular rate and rhythm. GI: Abdomen soft and nontender. Spleen is enlarged 3 fingerbreadths below the left costal margin. Vascular: No leg edema.   Lab Results:  Lab Results  Component Value Date   WBC 29.3* 02/13/2015   HGB 13.7 02/13/2015   HCT 45.4 02/13/2015   MCV 63.4* 02/13/2015   PLT 241 02/13/2015   NEUTROABS 24.3* 02/13/2015    Imaging:  No results found.  Medications: I have reviewed the patient's current medications.  Assessment/Plan: 1. Polycythemia vera-JAK-2 positive. 2. Iron deficiency secondary to phlebotomy. 3. Bilateral pulmonary embolism 11/27/2014-maintained onEliquis 4. Left lower chest/upper abdominal pain-likely proceed related to pulmonary emboli versus painful splenomegaly 5. Indeterminate solid lesions in the left kidney on CT 12/05/2014-evaluated by urology   Disposition: Ms. Shambley hematocrit  remains slightly above goal range. She is not sure if she made be dose adjustment to Hydrea following her visit 6 weeks ago. She will check her medications when she returns home and let us know. She understands that she may need a phlebotomy at the time of her next visit in 4-6 weeks if the hematocrit remains elevated.  With regard to anticoagulation for the pulmonary embolus she would like to switch to a different medication due to the cost of Eliquis. We contacted her pharmacy and determined the co-pay would be less for Xarelto 20 mg daily. She would like to switch to Xarelto. A prescription was sent to her pharmacy.  She will return for a follow-up visit and CBC, possible phlebotomy in approximately 4 weeks. She will contact the office in the interim with any problems. We specifically discussed worsening dyspnea.  Plan reviewed with Dr. Benay Spice. 25 minutes were spent face-to-face at today's visit with the majority of that time involved in counseling/coordination of care.  Ned Card ANP/GNP-BC   02/13/2015  12:43 PM

## 2015-02-13 NOTE — Telephone Encounter (Signed)
Gave patient relative avs report and appointments for September.  °

## 2015-02-15 ENCOUNTER — Other Ambulatory Visit: Payer: Self-pay | Admitting: *Deleted

## 2015-02-15 MED ORDER — HYDROXYUREA 500 MG PO CAPS: ORAL_CAPSULE | ORAL | Status: DC

## 2015-02-15 NOTE — Telephone Encounter (Signed)
Call from pharmacy requesting new Hydrea prescription. Pt stated she was to increase Hydrea to twice weekly. Reviewed with Ned Card, NP: Order received and called to pharmacy.

## 2015-02-19 ENCOUNTER — Encounter (HOSPITAL_COMMUNITY): Payer: Self-pay | Admitting: Emergency Medicine

## 2015-02-19 ENCOUNTER — Emergency Department (HOSPITAL_COMMUNITY)
Admission: EM | Admit: 2015-02-19 | Discharge: 2015-02-19 | Disposition: A | Discharge status: Home / Self Care | Payer: Medicare Other | Attending: Emergency Medicine | Admitting: Emergency Medicine

## 2015-02-19 DIAGNOSIS — Z8673 Personal history of transient ischemic attack (TIA), and cerebral infarction without residual deficits: Secondary | ICD-10-CM | POA: Insufficient documentation

## 2015-02-19 DIAGNOSIS — Z79899 Other long term (current) drug therapy: Secondary | ICD-10-CM | POA: Insufficient documentation

## 2015-02-19 DIAGNOSIS — I10 Essential (primary) hypertension: Secondary | ICD-10-CM | POA: Insufficient documentation

## 2015-02-19 DIAGNOSIS — M10072 Idiopathic gout, left ankle and foot: Secondary | ICD-10-CM | POA: Diagnosis not present

## 2015-02-19 DIAGNOSIS — Z86711 Personal history of pulmonary embolism: Secondary | ICD-10-CM | POA: Insufficient documentation

## 2015-02-19 DIAGNOSIS — M79672 Pain in left foot: Secondary | ICD-10-CM | POA: Diagnosis present

## 2015-02-19 LAB — CBC WITH DIFFERENTIAL/PLATELET
BASOS ABS: 0.2 10*3/uL — AB (ref 0.0–0.1)
BASOS PCT: 1 % (ref 0–1)
EOS ABS: 0.7 10*3/uL (ref 0.0–0.7)
Eosinophils Relative: 3 % (ref 0–5)
HCT: 41.2 % (ref 36.0–46.0)
HEMOGLOBIN: 12.3 g/dL (ref 12.0–15.0)
LYMPHS ABS: 1.7 10*3/uL (ref 0.7–4.0)
LYMPHS PCT: 7 % — AB (ref 12–46)
MCH: 19.2 pg — AB (ref 26.0–34.0)
MCHC: 29.9 g/dL — AB (ref 30.0–36.0)
MCV: 64.3 fL — ABNORMAL LOW (ref 78.0–100.0)
Monocytes Absolute: 2 10*3/uL — ABNORMAL HIGH (ref 0.1–1.0)
Monocytes Relative: 8 % (ref 3–12)
NEUTROS ABS: 19.9 10*3/uL — AB (ref 1.7–7.7)
Neutrophils Relative %: 81 % — ABNORMAL HIGH (ref 43–77)
Platelets: 213 10*3/uL (ref 150–400)
RBC: 6.41 MIL/uL — ABNORMAL HIGH (ref 3.87–5.11)
RDW: 24.2 % — ABNORMAL HIGH (ref 11.5–15.5)
WBC: 24.5 10*3/uL — ABNORMAL HIGH (ref 4.0–10.5)

## 2015-02-19 LAB — BASIC METABOLIC PANEL
Anion gap: 6 (ref 5–15)
BUN: 29 mg/dL — AB (ref 6–20)
CALCIUM: 8.9 mg/dL (ref 8.9–10.3)
CHLORIDE: 104 mmol/L (ref 101–111)
CO2: 28 mmol/L (ref 22–32)
CREATININE: 0.98 mg/dL (ref 0.44–1.00)
GFR calc non Af Amer: 49 mL/min — ABNORMAL LOW (ref >60)
GFR, EST AFRICAN AMERICAN: 57 mL/min — AB (ref >60)
Glucose, Bld: 124 mg/dL — ABNORMAL HIGH (ref 65–99)
Potassium: 4.2 mmol/L (ref 3.5–5.1)
Sodium: 138 mmol/L (ref 135–145)

## 2015-02-19 LAB — URIC ACID: Uric Acid, Serum: 9.8 mg/dL — ABNORMAL HIGH (ref 2.3–6.6)

## 2015-02-19 MED ORDER — HYDROCODONE-ACETAMINOPHEN 5-325 MG PO TABS: 2.0000 | ORAL_TABLET | ORAL | Status: DC | PRN

## 2015-02-19 MED ORDER — COLCHICINE 0.6 MG PO TABS
0.6000 mg | ORAL_TABLET | Freq: Once | ORAL | Status: AC
  Administered 2015-02-19: 0.6 mg via ORAL
  Filled 2015-02-19: qty 1

## 2015-02-19 MED ORDER — PREDNISONE 20 MG PO TABS: 20.0000 mg | ORAL_TABLET | Freq: Every day | ORAL | Status: DC

## 2015-02-19 MED ORDER — INDOMETHACIN 25 MG PO CAPS
25.0000 mg | ORAL_CAPSULE | Freq: Once | ORAL | Status: AC
  Administered 2015-02-19: 25 mg via ORAL
  Filled 2015-02-19: qty 1

## 2015-02-19 MED ORDER — INDOMETHACIN 25 MG PO CAPS
25.0000 mg | ORAL_CAPSULE | Freq: Three times a day (TID) | ORAL | Status: DC | PRN
Start: 2015-02-19 — End: 2015-03-05

## 2015-02-19 MED ORDER — PREDNISONE 20 MG PO TABS
40.0000 mg | ORAL_TABLET | Freq: Once | ORAL | Status: AC
  Administered 2015-02-19: 40 mg via ORAL
  Filled 2015-02-19: qty 2

## 2015-02-19 MED ORDER — HYDROCODONE-ACETAMINOPHEN 5-325 MG PO TABS
1.0000 | ORAL_TABLET | Freq: Once | ORAL | Status: AC
  Administered 2015-02-19: 1 via ORAL
  Filled 2015-02-19: qty 1

## 2015-02-19 NOTE — Discharge Instructions (Signed)

## 2015-02-19 NOTE — ED Notes (Signed)
[facility] son at [facility], states pt with recent hx of blood clots and pleural effusions, states pt has been in and out of the hospital past few months and believes pt may have blood clot in leg. PA in [place] notified and recommends pt be moved to more acute side due to hx.

## 2015-02-19 NOTE — ED Notes (Signed)
Pt. reports pain / swelling at left bunion onset last Sunday pain radiating to lower leg , denies injury / no fever or chills.

## 2015-02-19 NOTE — ED Notes (Signed)
MD at bedside. 

## 2015-02-19 NOTE — ED Provider Notes (Signed)
CSN: [id number]     Arrival date & time 02/19/15  2118 History   First MD Initiated Contact with Patient 02/19/15 2135     Chief Complaint  Patient presents with  . Gout      HPI  Patient returns for evaluation of left big toe pain and swelling. Has a history of a bunion on this to his never had an enlarged painful or an episode of gout. Does have history polycythemia. Increasing redness pain and swelling of the left toe for the last 2 days which she is limping and complaining of pain. Noticeably red and swollen.  Past Medical History  Diagnosis Date  . Complete heart block   . Hypercholesterolemia   . Polycythemia vera(238.4)   . Syncope   . Hypertension   . Stroke   . Pulmonary emboli    Past Surgical History  Procedure Laterality Date  . Pacemaker insertion  03/26/09    MDT Adapta DR, revised for RV lead fracture by Dr Doreatha Lew  . Hand surgery      S/P Carpal tunnel repair  . Appendectomy    . Transthoracic echocardiogram  03/26/2009  . US echocardiography  03/01/2009    EF 55-60%  . Esophagogastroduodenoscopy N/A 11/30/2014    Procedure: ESOPHAGOGASTRODUODENOSCOPY (EGD);  Surgeon: Laurence Spates, MD;  Location: [facility] ENDOSCOPY;  Service: Endoscopy;  Laterality: N/A;   Family History  Problem Relation Age of Onset  . Adopted: Yes  . Migraines Son   . Heart disease Son     CABGx5  . Stroke Son 28  . Canavan disease Son     prostate  . Pulmonary embolism Daughter     x2 (age 13 & 31)  . Deep vein thrombosis Daughter   . Asthma Daughter   . Migraines Daughter   . Depression Daughter   . Other Daughter     Acid reflux   Social History  Substance Use Topics  . Smoking status: Never Smoker   . Smokeless tobacco: Never Used     Comment: never smoked  . Alcohol Use: No   OB History    No data available     Review of Systems  Constitutional: Negative for fever, chills, diaphoresis, appetite change and fatigue.  HENT: Negative for mouth sores, sore throat and  trouble swallowing.   Eyes: Negative for visual disturbance.  Respiratory: Negative for cough, chest tightness, shortness of breath and wheezing.   Cardiovascular: Negative for chest pain.  Gastrointestinal: Negative for nausea, vomiting, abdominal pain, diarrhea and abdominal distention.  Endocrine: Negative for polydipsia, polyphagia and polyuria.  Genitourinary: Negative for dysuria, frequency and hematuria.  Musculoskeletal: Negative for gait problem.       Redness pain and soft tissue swelling to the left great toe.  Skin: Negative for color change, pallor and rash.  Neurological: Negative for dizziness, syncope, light-headedness and headaches.  Hematological: Does not bruise/bleed easily.  Psychiatric/Behavioral: Negative for behavioral problems and confusion.      Allergies  Chocolate and Pollen extract  Home Medications   Prior to Admission medications   Medication Sig Start Date End Date Taking? Authorizing Provider  amLODipine (NORVASC) 5 MG tablet Take 5 mg by mouth daily.     Yes Historical Provider, MD  beta carotene w/minerals (OCUVITE) tablet Take 1 tablet by mouth daily.     Yes Historical Provider, MD  calcium-vitamin D (OSCAL WITH D) 500-200 MG-UNIT per tablet Take 1 tablet by mouth daily.    Yes Historical Provider, MD  hydroxyurea (HYDREA) 500 MG capsule Take one capsule (500 mg) PO every Sun and Thurs. May take with food to minimize GI side effects. 02/15/15  Yes Ladell Pier, MD  Polyethyl Glycol-Propyl Glycol (SYSTANE ULTRA OP) Apply 1 drop to eye 2 (two) times daily.    Yes Historical Provider, MD  pyridOXINE (VITAMIN B-6) 100 MG tablet Take 100 mg by mouth daily.   Yes Historical Provider, MD  rivaroxaban (XARELTO) 20 MG TABS tablet Take 1 tablet (20 mg total) by mouth daily with supper. Patient taking differently: Take 20 mg by mouth at bedtime.  02/13/15  Yes Owens Shark, NP  simvastatin (ZOCOR) 10 MG tablet Take 10 mg by mouth at bedtime.    Yes  Historical Provider, MD  acetaminophen (TYLENOL) 325 MG tablet Take 2 tablets (650 mg total) by mouth every 6 (six) hours as needed for mild pain (temperature >/= 99.5 F). 12/07/14   Kelvin Cellar, MD  apixaban (ELIQUIS) 5 MG TABS tablet Take 1 tablet (5 mg total) by mouth 2 (two) times daily. 12/05/14   Debbe Odea, MD  HYDROcodone-acetaminophen (NORCO/VICODIN) 5-325 MG per tablet Take 2 tablets by mouth every 4 (four) hours as needed. 02/19/15   Tanna Furry, MD  indomethacin (INDOCIN) 25 MG capsule Take 1 capsule (25 mg total) by mouth 3 (three) times daily as needed. 02/19/15   Tanna Furry, MD  pantoprazole (PROTONIX) 40 MG tablet Take 1 tablet (40 mg total) by mouth daily. Switch for any other PPI at similar dose and frequency 11/30/14   Debbe Odea, MD  predniSONE (DELTASONE) 20 MG tablet Take 1 tablet (20 mg total) by mouth daily with breakfast. 02/19/15   Tanna Furry, MD   BP 151/50 mmHg  Pulse 68  Temp(Src) 98.1 F (36.7 C) (Oral)  Resp 18  Ht 5\' 2"  (1.575 m)  Wt 122 lb (55.339 kg)  BMI 22.31 kg/m2  SpO2 97% Physical Exam  Constitutional: She is oriented to person, place, and time. She appears well-developed and well-nourished. No distress.  HENT:  Head: Normocephalic.  Eyes: Conjunctivae are normal. Pupils are equal, round, and reactive to light. No scleral icterus.  Neck: Normal range of motion. Neck supple. No thyromegaly present.  Cardiovascular: Normal rate and regular rhythm.  Exam reveals no gallop and no friction rub.   No murmur heard. Pulmonary/Chest: Effort normal and breath sounds normal. No respiratory distress. She has no wheezes. She has no rales.  Abdominal: Soft. Bowel sounds are normal. She exhibits no distension. There is no tenderness. There is no rebound.  Musculoskeletal: Normal range of motion.       Feet:  Neurological: She is alert and oriented to person, place, and time.  Skin: Skin is warm and dry. No rash noted.  Psychiatric: She has a normal mood and  affect. Her behavior is normal.    ED Course  Procedures (including critical care time) Labs Review Labs Reviewed  CBC WITH DIFFERENTIAL/PLATELET - Abnormal; Notable for the following:    WBC 24.5 (*)    RBC 6.41 (*)    MCV 64.3 (*)    MCH 19.2 (*)    MCHC 29.9 (*)    RDW 24.2 (*)    Neutrophils Relative % 81 (*)    Lymphocytes Relative 7 (*)    Neutro Abs 19.9 (*)    Monocytes Absolute 2.0 (*)    Basophils Absolute 0.2 (*)    All other components within normal limits  BASIC METABOLIC PANEL - Abnormal; Notable  for the following:    Glucose, Bld 124 (*)    BUN 29 (*)    GFR calc non Af Amer 49 (*)    GFR calc Af Amer 57 (*)    All other components within normal limits  URIC ACID - Abnormal; Notable for the following:    Uric Acid, Serum 9.8 (*)    All other components within normal limits    Imaging Review No results found. I have personally reviewed and evaluated these images and lab results as part of my medical decision-making.   EKG Interpretation None      MDM   Final diagnoses:  Acute idiopathic gout of left foot    Has a leukocytosis that is at baseline for her last 6 months. Also has a history of polycythemia. I think likely her gout is from her rapid turnover versus cells. Has a history of some splenomegaly secondary to this as well. Given colchicine, Vicodin, prednisone. Her symptoms are improving. She is appropriate for discharge. Prescriptions indomethacin, Vicodin, colchicine./    Tanna Furry, MD 02/19/15 2325

## 2015-02-22 ENCOUNTER — Telehealth: Payer: Self-pay | Admitting: *Deleted

## 2015-02-22 MED ORDER — HYDROXYUREA 500 MG PO CAPS: ORAL_CAPSULE | ORAL | Status: DC

## 2015-02-22 NOTE — Telephone Encounter (Signed)
PT.'S DAUGHTER, KATHY, HAS CHECKED ON HER MOTHER'S HYDREA PRESCRIPTION. PT. SHOULD HAVE HAD THE HYDREA REFILLED ON 01/26/15 FOR #30 PILLS BUT SHE DID NOT HAVE IT FILLED. ON 02/13/15 KATHY CHECKED HER MOTHER'S BOTTLE AND THERE WERE SEVEN PILLS LEFT. THE PHARMACY STATES THE INSURANCE COMPANY WILL NOT FILL PT.'S PRESCRIPTION UNTIL 03/22/15 UNLESS DR.SHERRILL SENDS A NEW PRESCRIPTION. PLEASE SEND A NEW PRESCRIPTION TO [facility] ON [address] AND NOTIFIED PT.'S [place] THERE HAS BEEN A CHANGE IN THE PT.'S DOSE SINCE THE LAST PRESCRIPTION.

## 2015-02-22 NOTE — Telephone Encounter (Signed)
Patient only taking 2x/week; called daughter, Juliann Pulse, to verify this and she voiced this is correct. New script escribed to pharmacy noted, she appreciated call. Knows to call us if there is a problem refilling medication.

## 2015-02-28 ENCOUNTER — Emergency Department (HOSPITAL_COMMUNITY)
Admission: EM | Admit: 2015-02-28 | Discharge: 2015-02-28 | Disposition: A | Discharge status: Home / Self Care | Payer: Medicare Other | Attending: Emergency Medicine | Admitting: Emergency Medicine

## 2015-02-28 ENCOUNTER — Emergency Department (HOSPITAL_COMMUNITY): Payer: Medicare Other

## 2015-02-28 ENCOUNTER — Encounter (HOSPITAL_COMMUNITY): Payer: Self-pay | Admitting: Emergency Medicine

## 2015-02-28 DIAGNOSIS — R51 Headache: Secondary | ICD-10-CM | POA: Diagnosis not present

## 2015-02-28 DIAGNOSIS — R0602 Shortness of breath: Secondary | ICD-10-CM | POA: Diagnosis present

## 2015-02-28 DIAGNOSIS — E780 Pure hypercholesterolemia: Secondary | ICD-10-CM | POA: Insufficient documentation

## 2015-02-28 DIAGNOSIS — R531 Weakness: Secondary | ICD-10-CM | POA: Insufficient documentation

## 2015-02-28 DIAGNOSIS — R410 Disorientation, unspecified: Secondary | ICD-10-CM | POA: Diagnosis not present

## 2015-02-28 DIAGNOSIS — R55 Syncope and collapse: Secondary | ICD-10-CM | POA: Diagnosis not present

## 2015-02-28 DIAGNOSIS — R519 Headache, unspecified: Secondary | ICD-10-CM

## 2015-02-28 DIAGNOSIS — Z79899 Other long term (current) drug therapy: Secondary | ICD-10-CM | POA: Insufficient documentation

## 2015-02-28 DIAGNOSIS — Z8673 Personal history of transient ischemic attack (TIA), and cerebral infarction without residual deficits: Secondary | ICD-10-CM | POA: Insufficient documentation

## 2015-02-28 DIAGNOSIS — R0600 Dyspnea, unspecified: Secondary | ICD-10-CM | POA: Diagnosis not present

## 2015-02-28 DIAGNOSIS — I10 Essential (primary) hypertension: Secondary | ICD-10-CM | POA: Insufficient documentation

## 2015-02-28 DIAGNOSIS — Z86711 Personal history of pulmonary embolism: Secondary | ICD-10-CM | POA: Diagnosis not present

## 2015-02-28 DIAGNOSIS — R42 Dizziness and giddiness: Secondary | ICD-10-CM | POA: Diagnosis not present

## 2015-02-28 LAB — URINALYSIS, ROUTINE W REFLEX MICROSCOPIC
BILIRUBIN URINE: NEGATIVE
GLUCOSE, UA: NEGATIVE mg/dL
HGB URINE DIPSTICK: NEGATIVE
Ketones, ur: NEGATIVE mg/dL
Leukocytes, UA: NEGATIVE
Nitrite: NEGATIVE
PH: 7.5 (ref 5.0–8.0)
Protein, ur: NEGATIVE mg/dL
SPECIFIC GRAVITY, URINE: 1.021 (ref 1.005–1.030)
UROBILINOGEN UA: 0.2 mg/dL (ref 0.0–1.0)

## 2015-02-28 LAB — CBC
HEMATOCRIT: 44.9 % (ref 36.0–46.0)
HEMOGLOBIN: 13.7 g/dL (ref 12.0–15.0)
MCH: 19.3 pg — AB (ref 26.0–34.0)
MCHC: 30.5 g/dL (ref 30.0–36.0)
MCV: 63.2 fL — AB (ref 78.0–100.0)
Platelets: 162 10*3/uL (ref 150–400)
RBC: 7.11 MIL/uL — ABNORMAL HIGH (ref 3.87–5.11)
RDW: 24.2 % — AB (ref 11.5–15.5)
WBC: 26 10*3/uL — ABNORMAL HIGH (ref 4.0–10.5)

## 2015-02-28 LAB — BASIC METABOLIC PANEL
ANION GAP: 9 (ref 5–15)
BUN: 34 mg/dL — AB (ref 6–20)
CO2: 24 mmol/L (ref 22–32)
Calcium: 9.9 mg/dL (ref 8.9–10.3)
Chloride: 105 mmol/L (ref 101–111)
Creatinine, Ser: 1.04 mg/dL — ABNORMAL HIGH (ref 0.44–1.00)
GFR calc Af Amer: 53 mL/min — ABNORMAL LOW (ref >60)
GFR, EST NON AFRICAN AMERICAN: 46 mL/min — AB (ref >60)
GLUCOSE: 128 mg/dL — AB (ref 65–99)
POTASSIUM: 4.1 mmol/L (ref 3.5–5.1)
Sodium: 138 mmol/L (ref 135–145)

## 2015-02-28 LAB — I-STAT TROPONIN, ED: TROPONIN I, POC: 0 ng/mL (ref 0.00–0.08)

## 2015-02-28 MED ORDER — IOHEXOL 350 MG/ML SOLN
100.0000 mL | Freq: Once | INTRAVENOUS | Status: AC | PRN
  Administered 2015-02-28: 75 mL via INTRAVENOUS

## 2015-02-28 NOTE — Discharge Instructions (Signed)
·   Work-up here in the ED was negative for any major causes of shortness of breath  Recommend that you follow-up with your cardiologist soon as well as your PCP in the next 2 weeks  Symptoms that would require you to return to the ED sooner include: fever, worsening shortness of breath, chest pain, fainting. Also please see below information.   Shortness of Breath Shortness of breath means you have trouble breathing. Shortness of breath needs medical care right away. HOME CARE   Do not smoke.  Avoid being around chemicals or things (paint fumes, dust) that may bother your breathing.  Rest as needed. Slowly begin your normal activities.  Only take medicines as told by your doctor.  Keep all doctor visits as told. GET HELP RIGHT AWAY IF:   Your shortness of breath gets worse.  You feel lightheaded, pass out (faint), or have a cough that is not helped by medicine.  You cough up blood.  You have pain with breathing.  You have pain in your chest, arms, shoulders, or belly (abdomen).  You have a fever.  You cannot walk up stairs or exercise the way you normally do.  You do not get better in the time expected.  You have a hard time doing normal activities even with rest.  You have problems with your medicines.  You have any new symptoms. MAKE SURE YOU:  Understand these instructions.  Will watch your condition.  Will get help right away if you are not doing well or get worse. Document Released: 11/19/2007 Document Revised: 06/07/2013 Document Reviewed: 08/18/2011 [facility] Patient Information 2015 [place], [place]. This information is not intended to replace advice given to you by your health care provider. Make sure you discuss any questions you have with your health care provider.

## 2015-02-28 NOTE — ED Provider Notes (Signed)
Complains of dyspnea with exertion onset approximately 1 month ago. And headache feels like a diffuse pressure in her head onset approximately 2 weeks ago. She denies any headache presently. Patient reportedly had near syncopal earlier this morning. Chest x-ray viewed by me  Orlie Dakin, MD 02/28/15 [phone]

## 2015-02-28 NOTE — ED Notes (Signed)
Onset of shortness of breath one month ago worsening overtime.  Seen at [facility] [place] today sent to ED for evaluation.  Headache onset 2 weeks ago intermittent then constant. Currently denies headache. Complaints of general weakness tired with SOB on exertion. Alert answering and following commands appropriate.

## 2015-02-28 NOTE — ED Notes (Signed)
Patient brought back via wheelchair; patient undressed, in gown, on monitor, continuous pulse oximetry and blood pressure cuff; visitor at bedside; Marya Amsler, RN present in room

## 2015-02-28 NOTE — ED Notes (Signed)
Pt still feels short of breathe. J. P. Aware.

## 2015-02-28 NOTE — ED Notes (Signed)
Tom from [employer]- Pacing both chambers 88 BPM. 2 episodes of irregularity, last was August 8th. J.P. D.O. Informed.

## 2015-02-28 NOTE — ED Provider Notes (Signed)
CSN: [id number]     Arrival date & time 02/28/15  0910 History   First MD Initiated Contact with Patient 02/28/15 0914     Chief Complaint  Patient presents with  . Shortness of Breath    HPI  [name] is a 81 y.o. female with PMH significant for pacemaker, polycythemia, PE diagnosed in June on Xarelto, TIA, hypertension, presenting to the ED from PCP office for dyspnea and syncopal episode. Patient's daughter is also present and provides history.   Patient states that she has been short of breath over the last month. It has gotten progressively worse over the last week or so. Daughter states the patient has been panting and having trouble getting her words out due to shortness of breath. She is short of breath at rest and is exacerbated with exertion. Patient still able to walk daily for 15 minutes x4. Along with shortness of breath patient is also stating that she has been having headaches for the last 2 weeks. She said this morning when she woke up her head felt funny. She describes a pressure sensation in her head. She was also having some confusion. She states that her memory has progressively gotten worse over the last year. At PCPs office patient experienced a brief episode of syncope. This is happened before to the patient last episode being 2 years ago.    Past Medical History  Diagnosis Date  . Complete heart block   . Hypercholesterolemia   . Polycythemia vera(238.4)   . Syncope   . Hypertension   . Stroke   . Pulmonary emboli    Past Surgical History  Procedure Laterality Date  . Pacemaker insertion  03/26/09    MDT Adapta DR, revised for RV lead fracture by Dr Doreatha Lew  . Hand surgery      S/P Carpal tunnel repair  . Appendectomy    . Transthoracic echocardiogram  03/26/2009  . US echocardiography  03/01/2009    EF 55-60%  . Esophagogastroduodenoscopy N/A 11/30/2014    Procedure: ESOPHAGOGASTRODUODENOSCOPY (EGD);  Surgeon: Laurence Spates, MD;  Location: [facility]  ENDOSCOPY;  Service: Endoscopy;  Laterality: N/A;   Family History  Problem Relation Age of Onset  . Adopted: Yes  . Migraines Son   . Heart disease Son     CABGx5  . Stroke Son 96  . Canavan disease Son     prostate  . Pulmonary embolism Daughter     x2 (age 25 & 23)  . Deep vein thrombosis Daughter   . Asthma Daughter   . Migraines Daughter   . Depression Daughter   . Other Daughter     Acid reflux   Social History  Substance Use Topics  . Smoking status: Never Smoker   . Smokeless tobacco: Never Used     Comment: never smoked  . Alcohol Use: No   OB History    No data available     Review of Systems  Constitutional: Negative for fever and unexpected weight change.  Respiratory: Positive for shortness of breath.   Cardiovascular: Positive for chest pain.  Gastrointestinal: Negative for nausea, vomiting and abdominal pain.  Genitourinary: Negative for dysuria.  Neurological: Positive for dizziness, syncope, weakness, light-headedness and headaches.  Psychiatric/Behavioral: Positive for confusion.  Also per HPI  Allergies  Chocolate and Pollen extract  Home Medications   Prior to Admission medications   Medication Sig Start Date End Date Taking? Authorizing Provider  acetaminophen (TYLENOL) 325 MG tablet Take 2 tablets (650  mg total) by mouth every 6 (six) hours as needed for mild pain (temperature >/= 99.5 F). 12/07/14   Kelvin Cellar, MD  amLODipine (NORVASC) 5 MG tablet Take 5 mg by mouth daily.      Historical Provider, MD  apixaban (ELIQUIS) 5 MG TABS tablet Take 1 tablet (5 mg total) by mouth 2 (two) times daily. 12/05/14   Debbe Odea, MD  beta carotene w/minerals (OCUVITE) tablet Take 1 tablet by mouth daily.      Historical Provider, MD  calcium-vitamin D (OSCAL WITH D) 500-200 MG-UNIT per tablet Take 1 tablet by mouth daily.     Historical Provider, MD  HYDROcodone-acetaminophen (NORCO/VICODIN) 5-325 MG per tablet Take 2 tablets by mouth every 4 (four)  hours as needed. 02/19/15   Tanna Furry, MD  hydroxyurea (HYDREA) 500 MG capsule Take one capsule (500 mg) PO every Sun and Thurs. May take with food to minimize GI side effects. 02/22/15   Ladell Pier, MD  indomethacin (INDOCIN) 25 MG capsule Take 1 capsule (25 mg total) by mouth 3 (three) times daily as needed. 02/19/15   Tanna Furry, MD  pantoprazole (PROTONIX) 40 MG tablet Take 1 tablet (40 mg total) by mouth daily. Switch for any other PPI at similar dose and frequency 11/30/14   Debbe Odea, MD  Polyethyl Glycol-Propyl Glycol (SYSTANE ULTRA OP) Apply 1 drop to eye 2 (two) times daily.     Historical Provider, MD  predniSONE (DELTASONE) 20 MG tablet Take 1 tablet (20 mg total) by mouth daily with breakfast. 02/19/15   Tanna Furry, MD  pyridOXINE (VITAMIN B-6) 100 MG tablet Take 100 mg by mouth daily.    Historical Provider, MD  rivaroxaban (XARELTO) 20 MG TABS tablet Take 1 tablet (20 mg total) by mouth daily with supper. Patient taking differently: Take 20 mg by mouth at bedtime.  02/13/15   Owens Shark, NP  simvastatin (ZOCOR) 10 MG tablet Take 10 mg by mouth at bedtime.     Historical Provider, MD   BP 143/58 mmHg  Pulse 62  Temp(Src) 97.9 F (36.6 C) (Oral)  Resp 18  Ht 5\' 2"  (1.575 m)  Wt 118 lb (53.524 kg)  BMI 21.58 kg/m2  SpO2 97% Physical Exam  Constitutional: She is oriented to person, place, and time. She appears well-developed and well-nourished.  HENT:  Head: Normocephalic and atraumatic.  Mouth/Throat: Oropharynx is clear and moist.  Eyes: Conjunctivae and EOM are normal. Pupils are equal, round, and reactive to light.  Cardiovascular: Normal rate and regular rhythm.   Pulmonary/Chest: Effort normal and breath sounds normal.  Abdominal: Soft. Bowel sounds are normal. There is no tenderness.  Musculoskeletal: Normal range of motion. She exhibits no edema.  Neurological: She is alert and oriented to person, place, and time. She has normal strength. No cranial nerve deficit or  sensory deficit.  Skin: Skin is warm and dry.  Psychiatric: She has a normal mood and affect.    ED Course  Procedures (including critical care time) Labs Review Labs Reviewed  BASIC METABOLIC PANEL - Abnormal; Notable for the following:    Glucose, Bld 128 (*)    BUN 34 (*)    Creatinine, Ser 1.04 (*)    GFR calc non Af Amer 46 (*)    GFR calc Af Amer 53 (*)    All other components within normal limits  CBC - Abnormal; Notable for the following:    WBC 26.0 (*)    RBC 7.11 (*)  MCV 63.2 (*)    MCH 19.3 (*)    RDW 24.2 (*)    All other components within normal limits  URINALYSIS, ROUTINE W REFLEX MICROSCOPIC (NOT AT [facility])  Randolm Idol, ED    Imaging Review Dg Chest 2 View  02/28/2015   CLINICAL DATA:  Weakness with acute on chronic shortness of breath. History of hypertension, stroke and pulmonary emboli. Initial encounter.  EXAM: CHEST  2 VIEW  COMPARISON:  Radiographs 11/27/2014.  CT 12/05/2014.  FINDINGS: The right subclavian pacemaker leads appear unchanged within the right atrium and right ventricle. The heart size and mediastinal contours are stable. The lungs are now clear. There is no pleural effusion or pneumothorax. Old rib fractures noted on the left.  IMPRESSION: No active cardiopulmonary process.   Electronically Signed   By: Richardean Sale M.D.   On: 02/28/2015 10:39   Ct Head Wo Contrast  02/28/2015   CLINICAL DATA:  Headache for 2 weeks. Shortness of breath, weakness.  EXAM: CT HEAD WITHOUT CONTRAST  TECHNIQUE: Contiguous axial images were obtained from the base of the skull through the vertex without intravenous contrast.  COMPARISON:  04/01/2012  FINDINGS: There is atrophy and chronic small vessel disease changes. No acute intracranial abnormality. Specifically, no hemorrhage, hydrocephalus, mass lesion, acute infarction, or significant intracranial injury. No acute calvarial abnormality. Visualized paranasal sinuses and mastoids clear. Orbital soft tissues  unremarkable.  IMPRESSION: No acute intracranial abnormality.  Atrophy, chronic microvascular disease.   Electronically Signed   By: Rolm Baptise M.D.   On: 02/28/2015 11:01   Ct Angio Chest Pe W/cm &/or Wo Cm  02/28/2015   CLINICAL DATA:  Shortness of breath for 9 or 10 days. History bilateral pulmonary embolism, hypertension and CVA. Initial encounter.  EXAM: CT ANGIOGRAPHY CHEST WITH CONTRAST  TECHNIQUE: Multidetector CT imaging of the chest was performed using the standard protocol during bolus administration of intravenous contrast. Multiplanar CT image reconstructions and MIPs were obtained to evaluate the vascular anatomy.  CONTRAST:  75mL OMNIPAQUE IOHEXOL 350 MG/ML SOLN  COMPARISON:  Radiographs today.  CT 12/05/2014.  FINDINGS: Mediastinum: The pulmonary arteries are well opacified with contrast. There is no evidence of recurrent or significant residual pulmonary embolism. There is atherosclerosis of the aorta, great vessels and coronary arteries. There are no enlarged mediastinal, hilar or axillary lymph nodes. The thyroid gland, trachea and esophagus demonstrate no significant findings.  Lungs/Pleura: There is no pleural effusion.Biapical scarring and a small right apical nodule on image 14 of series 6 are stable. There is no confluent airspace opacity.  Upper abdomen: The visualized upper abdomen has a stable appearance with splenomegaly and stable low-density hepatic lesions.  Musculoskeletal/Chest wall: No chest wall lesion or acute osseous findings.Right subclavian pacemaker appears unchanged.  Review of the MIP images confirms the above findings.  IMPRESSION: 1. No evidence of acute or significant residual pulmonary embolism. 2. No other acute chest findings demonstrated. 3. Stable atherosclerosis and splenomegaly.   Electronically Signed   By: Richardean Sale M.D.   On: 02/28/2015 11:45   I have personally reviewed and evaluated these images and lab results as part of my medical  decision-making.   EKG Interpretation   Date/Time:  Wednesday February 28 2015 09:25:21 EDT Ventricular Rate:  66 PR Interval:  206 QRS Duration: 147 QT Interval:  450 QTC Calculation: 471 R Axis:   116 Text Interpretation:  Atrial-ventricular dual-paced rhythm No further  analysis attempted due to paced rhythm No significant change since last  tracing Confirmed by Winfred Leeds  MD, SAM [phone]) on 02/28/2015 9:31:23 AM     MDM   Final diagnoses:  Dyspnea  Nonintractable episodic headache, unspecified headache type  Confusion   Patient presented to the ED for dyspnea, syncopal episode, and headache. Differential diagnosis for dyspnea includes atypical angina, pulmonary edema, cardiomyopathy, infection, anemia, recurrence of PE. Headache DDx: intracranial bleed (on blood thinners), tension headache, CVA.   Work-up included CXR which was unremarkable, UA without signs of infection or dehydration, CT head normal, CT chest without signs of acute PE. CBC with leukocytosis (chronic finding), BMP unremarkable. Troponin negative. Pacemaker interrogated and normal.   After history, exam, and medical workup I feel the patient has been appropriately medically screened and is safe for discharge home. Pertinent diagnoses were discussed with the patient. Patient was given return precautions.   Discussed with patient need to follow-up as an outpatient with PCP and cardiologist. Dr. Dema Severin (patient's PCP) was consulted by phone and agreed to plan.   Luiz Blare, DO 02/28/2015, 2:13 PM PGY-2, [employer], DO 02/28/15 1413  Orlie Dakin, MD 02/28/15 [phone]

## 2015-02-28 NOTE — ED Notes (Signed)
Charge RN called to interrogate pt pacemaker.

## 2015-03-05 ENCOUNTER — Encounter: Payer: Self-pay | Admitting: Internal Medicine

## 2015-03-05 ENCOUNTER — Ambulatory Visit (INDEPENDENT_AMBULATORY_CARE_PROVIDER_SITE_OTHER): Payer: Medicare Other | Admitting: Internal Medicine

## 2015-03-05 VITALS — BP 130/54 | HR 76 | Ht 62.0 in | Wt 120.2 lb

## 2015-03-05 DIAGNOSIS — R0600 Dyspnea, unspecified: Secondary | ICD-10-CM | POA: Diagnosis not present

## 2015-03-05 DIAGNOSIS — I2699 Other pulmonary embolism without acute cor pulmonale: Secondary | ICD-10-CM

## 2015-03-05 DIAGNOSIS — I441 Atrioventricular block, second degree: Secondary | ICD-10-CM

## 2015-03-05 DIAGNOSIS — Z950 Presence of cardiac pacemaker: Secondary | ICD-10-CM | POA: Diagnosis not present

## 2015-03-05 DIAGNOSIS — I10 Essential (primary) hypertension: Secondary | ICD-10-CM | POA: Diagnosis not present

## 2015-03-05 NOTE — Patient Instructions (Addendum)
Medication Instructions:  Your physician recommends that you continue on your current medications as directed. Please refer to the Current Medication list given to you today.   Labwork: None ordered  Testing/Procedures: None ordered  Follow-Up: Your physician wants you to follow-up in: 6 months with Truitt Merle, NP and12 months with Dr Vallery Ridge will receive a reminder letter in the mail two months in advance. If you don't receive a letter, please call our office to schedule the follow-up appointment.  Remote monitoring is used to monitor your Pacemaker  from home. This monitoring reduces the number of office visits required to check your device to one time per year. It allows [place] to keep an eye on the functioning of your device to ensure it is working properly. You are scheduled for a device check from home on 06/04/15. You may send your transmission at any time that day. If you have a wireless device, the transmission will be sent automatically. After your physician reviews your transmission, you will receive a postcard with your next transmission date.      Any Other Special Instructions Will Be Listed Below (If Applicable).

## 2015-03-06 LAB — CUP PACEART INCLINIC DEVICE CHECK
Brady Statistic AP VS Percent: 0 %
Brady Statistic AS VP Percent: 69 %
Date Time Interrogation Session: 20160919180759
Lead Channel Pacing Threshold Amplitude: 0.75 V
Lead Channel Pacing Threshold Amplitude: 1 V
Lead Channel Pacing Threshold Pulse Width: 0.4 ms
Lead Channel Pacing Threshold Pulse Width: 0.4 ms
MDC IDC MSMT BATTERY IMPEDANCE: 1187 Ohm
MDC IDC MSMT BATTERY REMAINING LONGEVITY: 45 mo
MDC IDC MSMT BATTERY VOLTAGE: 2.78 V
MDC IDC MSMT LEADCHNL RA IMPEDANCE VALUE: 397 Ohm
MDC IDC MSMT LEADCHNL RA SENSING INTR AMPL: 5.6 mV
MDC IDC MSMT LEADCHNL RV IMPEDANCE VALUE: 493 Ohm
MDC IDC SET LEADCHNL RA PACING AMPLITUDE: 2 V
MDC IDC SET LEADCHNL RV PACING AMPLITUDE: 2.5 V
MDC IDC SET LEADCHNL RV PACING PULSEWIDTH: 0.4 ms
MDC IDC SET LEADCHNL RV SENSING SENSITIVITY: 5.6 mV
MDC IDC STAT BRADY AP VP PERCENT: 28 %
MDC IDC STAT BRADY AS VS PERCENT: 3 %

## 2015-03-06 NOTE — Progress Notes (Signed)
Electrophysiology Office Note   Date:  03/06/2015   ID:  [name], DOB 10/09/1922, MRN [id number]  PCP:  Vidal Schwalbe, MD  Primary Electrophysiologist: Thompson Grayer, MD    Chief Complaint  Patient presents with  . CHB  . Mobitz II AV block     History of Present Illness: [name] is a 81 y.o. female who presents today for electrophysiology evaluation.  Since I saw her last, she had had significant decline (epic records reviewed).  She has had extensive PTE for which she has since been anticoagulated but remains quite SOB. Today, she denies symptoms of palpitations, exertional chest pain,  orthopnea, PND, lower extremity edema, claudication, dizziness, presyncope, syncope, bleeding, or neurologic sequela. The patient is tolerating medications without difficulties and is otherwise without complaint today.    Past Medical History  Diagnosis Date  . Complete heart block   . Hypercholesterolemia   . Polycythemia vera(238.4)   . Syncope   . Hypertension   . Stroke   . Pulmonary emboli   . Balance problem    Past Surgical History  Procedure Laterality Date  . Pacemaker insertion  03/26/09    MDT Adapta DR, revised for RV lead fracture by Dr Doreatha Lew  . Hand surgery      S/P Carpal tunnel repair  . Appendectomy    . Transthoracic echocardiogram  03/26/2009  . US echocardiography  03/01/2009    EF 55-60%  . Esophagogastroduodenoscopy N/A 11/30/2014    Procedure: ESOPHAGOGASTRODUODENOSCOPY (EGD);  Surgeon: Laurence Spates, MD;  Location: [facility] ENDOSCOPY;  Service: Endoscopy;  Laterality: N/A;     Current Outpatient Prescriptions  Medication Sig Dispense Refill  . acetaminophen (TYLENOL) 325 MG tablet Take 2 tablets (650 mg total) by mouth every 6 (six) hours as needed for mild pain (temperature >/= 99.5 F). 30 tablet 0  . amLODipine (NORVASC) 5 MG tablet Take 5 mg by mouth daily.      . beta carotene w/minerals (OCUVITE) tablet Take 1 tablet by mouth daily.        . calcium-vitamin D (OSCAL WITH D) 500-200 MG-UNIT per tablet Take 1 tablet by mouth daily.     . hydroxyurea (HYDREA) 500 MG capsule Take 500 mg by mouth as directed. Take every Sun and Thurs. May take with food to minimize GI side effects.    . pantoprazole (PROTONIX) 40 MG tablet Take 1 tablet (40 mg total) by mouth daily. Switch for any other PPI at similar dose and frequency 30 tablet 3  . Polyethyl Glycol-Propyl Glycol (SYSTANE ULTRA OP) Apply 1 drop to eye 2 (two) times daily.     Marland Kitchen pyridOXINE (VITAMIN B-6) 100 MG tablet Take 100 mg by mouth daily.    . rivaroxaban (XARELTO) 20 MG TABS tablet Take 1 tablet (20 mg total) by mouth daily with supper. (Patient taking differently: Take 20 mg by mouth at bedtime. ) 30 tablet 1  . simvastatin (ZOCOR) 10 MG tablet Take 10 mg by mouth at bedtime.     . predniSONE (DELTASONE) 20 MG tablet Take 1 tablet (20 mg total) by mouth daily with breakfast. 5 tablet 0   No current facility-administered medications for this visit.    Allergies:   Chocolate and Pollen extract   Social History:  The patient  reports that she has never smoked. She has never used smokeless tobacco. She reports that she does not drink alcohol or use illicit drugs.   Family History:  The patient's family history  includes Asthma in her daughter; Canavan disease in her son; Deep vein thrombosis in her daughter; Depression in her daughter; Heart disease in her son; Migraines in her daughter and son; Other in her daughter; Pulmonary embolism in her daughter; Stroke (age of onset: 12) in her son. She was adopted.    ROS:  Please see the history of present illness.   All other systems are reviewed and negative.    PHYSICAL EXAM: VS:  BP 130/54 mmHg  Pulse 76  Ht 5\' 2"  (1.575 m)  Wt 120 lb 3.2 oz (54.522 kg)  BMI 21.98 kg/m2  SpO2 97% , BMI Body mass index is 21.98 kg/(m^2). GEN: Well nourished, well developed, in no acute distress HEENT: normal Neck: no JVD, carotid bruits, or  masses Cardiac: RRR; no murmurs, rubs, or gallops,no edema  Respiratory:  clear to auscultation bilaterally, normal work of breathing GI: soft, nontender, nondistended, + BS MS: no deformity or atrophy, R knee looks fine Skin: warm and dry, device pocket is well healed Neuro:  Strength and sensation are intact Psych: euthymic mood, full affect  Device interrogation is reviewed today in detail.  See PaceArt for details.   Recent Labs: 11/28/2014: ALT 18 12/05/2014: B Natriuretic Peptide 214.4* 02/28/2015: BUN 34*; Creatinine, Ser 1.04*; Hemoglobin 13.7; Platelets 162; Potassium 4.1; Sodium 138    Lipid Panel     Component Value Date/Time   CHOL 78 12/06/2014 0500   TRIG 118 12/06/2014 0500   HDL 17* 12/06/2014 0500   CHOLHDL 4.6 12/06/2014 0500   VLDL 24 12/06/2014 0500   LDLCALC 37 12/06/2014 0500     Wt Readings from Last 3 Encounters:  03/05/15 120 lb 3.2 oz (54.522 kg)  02/28/15 118 lb (53.524 kg)  02/19/15 122 lb (55.339 kg)    ASSESSMENT AND PLAN:  1.  Complete heart block Normal pacemaker function See Pace Art report No changes today  2. HTN Stable No change required today  3. Prior stroke Currently on xarelto for recent PTE  4. PTE On xarelto sats today on room air and with ambulation 97%  Current medicines are reviewed at length with the patient today.   The patient does not have concerns regarding her medicines.  The following changes were made today:  none  Follow-up: carelink, return to see Truitt Merle in 6 months I will see in a year  Signed, Thompson Grayer, MD  03/06/2015 9:11 PM     [address] [place] [id number] [phone] (office) [phone] (fax)

## 2015-03-15 DIAGNOSIS — Z950 Presence of cardiac pacemaker: Secondary | ICD-10-CM | POA: Diagnosis not present

## 2015-03-15 DIAGNOSIS — Z23 Encounter for immunization: Secondary | ICD-10-CM | POA: Diagnosis not present

## 2015-03-15 DIAGNOSIS — D45 Polycythemia vera: Secondary | ICD-10-CM | POA: Diagnosis not present

## 2015-03-15 DIAGNOSIS — R413 Other amnesia: Secondary | ICD-10-CM | POA: Diagnosis not present

## 2015-03-15 DIAGNOSIS — I10 Essential (primary) hypertension: Secondary | ICD-10-CM | POA: Diagnosis not present

## 2015-03-16 ENCOUNTER — Ambulatory Visit: Payer: Medicare Other

## 2015-03-16 ENCOUNTER — Telehealth: Payer: Self-pay | Admitting: Oncology

## 2015-03-16 ENCOUNTER — Other Ambulatory Visit (HOSPITAL_BASED_OUTPATIENT_CLINIC_OR_DEPARTMENT_OTHER): Payer: Medicare Other

## 2015-03-16 ENCOUNTER — Telehealth: Payer: Self-pay | Admitting: *Deleted

## 2015-03-16 ENCOUNTER — Ambulatory Visit (HOSPITAL_BASED_OUTPATIENT_CLINIC_OR_DEPARTMENT_OTHER): Payer: Medicare Other | Admitting: Oncology

## 2015-03-16 ENCOUNTER — Ambulatory Visit (HOSPITAL_BASED_OUTPATIENT_CLINIC_OR_DEPARTMENT_OTHER): Payer: Medicare Other

## 2015-03-16 VITALS — BP 127/84 | HR 58 | Temp 97.9°F | Resp 17 | Ht 62.0 in | Wt 118.6 lb

## 2015-03-16 DIAGNOSIS — D45 Polycythemia vera: Secondary | ICD-10-CM

## 2015-03-16 DIAGNOSIS — I2699 Other pulmonary embolism without acute cor pulmonale: Secondary | ICD-10-CM | POA: Diagnosis not present

## 2015-03-16 DIAGNOSIS — E611 Iron deficiency: Secondary | ICD-10-CM

## 2015-03-16 DIAGNOSIS — D696 Thrombocytopenia, unspecified: Secondary | ICD-10-CM

## 2015-03-16 LAB — CBC WITH DIFFERENTIAL/PLATELET
BASO%: 1.2 % (ref 0.0–2.0)
Basophils Absolute: 0.2 10*3/uL — ABNORMAL HIGH (ref 0.0–0.1)
EOS ABS: 0.7 10*3/uL — AB (ref 0.0–0.5)
EOS%: 3.5 % (ref 0.0–7.0)
HEMATOCRIT: 44.7 % (ref 34.8–46.6)
HGB: 13.2 g/dL (ref 11.6–15.9)
LYMPH#: 1.3 10*3/uL (ref 0.9–3.3)
LYMPH%: 6.5 % — AB (ref 14.0–49.7)
MCH: 18.6 pg — ABNORMAL LOW (ref 25.1–34.0)
MCHC: 29.5 g/dL — AB (ref 31.5–36.0)
MCV: 63 fL — ABNORMAL LOW (ref 79.5–101.0)
MONO#: 0.8 10*3/uL (ref 0.1–0.9)
MONO%: 4.2 % (ref 0.0–14.0)
NEUT%: 84.6 % — ABNORMAL HIGH (ref 38.4–76.8)
NEUTROS ABS: 16.9 10*3/uL — AB (ref 1.5–6.5)
NRBC: 0 % (ref 0–0)
PLATELETS: 36 10*3/uL — AB (ref 145–400)
RBC: 7.09 10*6/uL — ABNORMAL HIGH (ref 3.70–5.45)
RDW: 27.4 % — AB (ref 11.2–14.5)
WBC: 20 10*3/uL — AB (ref 3.9–10.3)

## 2015-03-16 LAB — TECHNOLOGIST REVIEW

## 2015-03-16 NOTE — Telephone Encounter (Signed)
Called pt to review thrombocytopenia precautions. Informed her repeat CBC confirmed low PLT. Dr. Benay Spice wants to repeat lab 10/6. She requested I call her daughter Juliann Pulse, left message on voicemail informing of new appt 10/6- requested she call office to discuss.

## 2015-03-16 NOTE — Telephone Encounter (Signed)
per po fto sch pt appt-gave pt copy of avs-sent back to lab °

## 2015-03-16 NOTE — Progress Notes (Signed)
  [place] OFFICE PROGRESS NOTE   Diagnosis: Polycythemia vera  INTERVAL HISTORY:   [name] returns as scheduled. She continues hydroxyurea 2 days per week. She was seen in the emergency room with dyspnea on 02/28/2015. A CT the chest showed no acute change. The dyspnea has improved. She denies bleeding. She reports the tongue has been sore since she bit her tongue 2 years ago.  Objective:  Vital signs in last 24 hours:  Blood pressure 127/84, pulse 58, temperature 97.9 F (36.6 C), temperature source Oral, resp. rate 17, height 5\' 2"  (1.575 m), weight 118 lb 9.6 oz (53.797 kg), SpO2 99 %.    HEENT: 2-3 mm ecchymosis at the left buccal mucosa, no other bleeding Resp: Lungs clear bilaterally Cardio: Regular rate and rhythm GI: No hepatomegaly, the spleen tip is palpable in the left mid abdomen Vascular: No leg edema  Skin: No petechiae     Lab Results:  Lab Results  Component Value Date   WBC 20.0* 03/16/2015   HGB 13.2 03/16/2015   HCT 44.7 03/16/2015   MCV 63.0* 03/16/2015   PLT 36* 03/16/2015   NEUTROABS 16.9* 03/16/2015    Blood smear-the platelets are decreased in number. No platelet clumps. Medications: I have reviewed the patient's current medications.  Assessment/Plan: 1. Polycythemia vera-JAK-2 positive. 2. Iron deficiency secondary to phlebotomy. 3. Bilateral pulmonary embolism 11/27/2014-maintained onxarelto 4. Left lower chest/upper abdominal pain-likely proceed related to pulmonary emboli versus painful splenomegaly 5. Indeterminate solid lesions in the left kidney on CT 12/05/2014-evaluated by urology 6. Thrombocytopenia-new, etiology unclear   Disposition:  The hemoglobin and hematocrit are slightly above goal range. She has developed new thrombocytopenia. She has been maintained on hydroxyurea at a low dose chronically without thrombocytopenia. It is possible the thumb cytopenias related to the myeloproliferative disorder,  new onset ITP, or another etiology. She will discontinue hydroxyurea and return for a CBC in one week. Ms. Mask will contact us for bleeding. We will discontinue Xarelto if she has bleeding.  She will return for an office visit in 2 weeks. We will decide on phlebotomy therapy based on the hemoglobin level when she returns in 2 weeks.  Betsy Coder, MD  03/16/2015  2:55 PM

## 2015-03-22 ENCOUNTER — Encounter: Payer: Self-pay | Admitting: *Deleted

## 2015-03-22 ENCOUNTER — Other Ambulatory Visit (HOSPITAL_BASED_OUTPATIENT_CLINIC_OR_DEPARTMENT_OTHER): Payer: Medicare Other

## 2015-03-22 DIAGNOSIS — D45 Polycythemia vera: Secondary | ICD-10-CM

## 2015-03-22 LAB — CBC WITH DIFFERENTIAL/PLATELET
BASO%: 0.3 % (ref 0.0–2.0)
Basophils Absolute: 0.1 10*3/uL (ref 0.0–0.1)
EOS ABS: 0.4 10*3/uL (ref 0.0–0.5)
EOS%: 1.7 % (ref 0.0–7.0)
HCT: 46 % (ref 34.8–46.6)
HGB: 13.6 g/dL (ref 11.6–15.9)
LYMPH%: 6.2 % — AB (ref 14.0–49.7)
MCH: 19.6 pg — ABNORMAL LOW (ref 25.1–34.0)
MCHC: 29.6 g/dL — AB (ref 31.5–36.0)
MCV: 66.2 fL — ABNORMAL LOW (ref 79.5–101.0)
MONO#: 1.1 10*3/uL — AB (ref 0.1–0.9)
MONO%: 4.9 % (ref 0.0–14.0)
NEUT%: 86.9 % — ABNORMAL HIGH (ref 38.4–76.8)
NEUTROS ABS: 19.5 10*3/uL — AB (ref 1.5–6.5)
NRBC: 0 % (ref 0–0)
RBC: 6.95 10*6/uL — AB (ref 3.70–5.45)
RDW: 25.8 % — AB (ref 11.2–14.5)
WBC: 22.4 10*3/uL — AB (ref 3.9–10.3)
lymph#: 1.4 10*3/uL (ref 0.9–3.3)

## 2015-03-22 NOTE — Progress Notes (Signed)
CBC reviewed in detail with Dr. Benay Spice; per Dr. Benay Spice: CBC looks better, continue to hold hydrea and keep appts as scheduled. Information given to pt and daughter in lobby and both voice understanding.

## 2015-03-29 ENCOUNTER — Other Ambulatory Visit (HOSPITAL_BASED_OUTPATIENT_CLINIC_OR_DEPARTMENT_OTHER): Payer: Medicare Other

## 2015-03-29 ENCOUNTER — Telehealth: Payer: Self-pay | Admitting: Oncology

## 2015-03-29 ENCOUNTER — Ambulatory Visit (HOSPITAL_BASED_OUTPATIENT_CLINIC_OR_DEPARTMENT_OTHER): Payer: Medicare Other | Admitting: Oncology

## 2015-03-29 VITALS — BP 152/62 | HR 84 | Temp 98.1°F | Resp 20 | Ht 62.0 in | Wt 120.0 lb

## 2015-03-29 DIAGNOSIS — Z7901 Long term (current) use of anticoagulants: Secondary | ICD-10-CM

## 2015-03-29 DIAGNOSIS — I2699 Other pulmonary embolism without acute cor pulmonale: Secondary | ICD-10-CM

## 2015-03-29 DIAGNOSIS — D45 Polycythemia vera: Secondary | ICD-10-CM

## 2015-03-29 DIAGNOSIS — D508 Other iron deficiency anemias: Secondary | ICD-10-CM

## 2015-03-29 LAB — CBC WITH DIFFERENTIAL/PLATELET
BASO%: 0.8 % (ref 0.0–2.0)
Basophils Absolute: 0.2 10*3/uL — ABNORMAL HIGH (ref 0.0–0.1)
EOS ABS: 1 10*3/uL — AB (ref 0.0–0.5)
EOS%: 3.5 % (ref 0.0–7.0)
HEMATOCRIT: 46 % (ref 34.8–46.6)
HGB: 13.6 g/dL (ref 11.6–15.9)
LYMPH%: 6.3 % — AB (ref 14.0–49.7)
MCH: 19.5 pg — AB (ref 25.1–34.0)
MCHC: 29.6 g/dL — AB (ref 31.5–36.0)
MCV: 65.8 fL — AB (ref 79.5–101.0)
MONO#: 1.6 10*3/uL — AB (ref 0.1–0.9)
MONO%: 5.7 % (ref 0.0–14.0)
NEUT#: 24 10*3/uL — ABNORMAL HIGH (ref 1.5–6.5)
NEUT%: 83.7 % — AB (ref 38.4–76.8)
PLATELETS: 440 10*3/uL — AB (ref 145–400)
RBC: 6.99 10*6/uL — ABNORMAL HIGH (ref 3.70–5.45)
RDW: 26 % — ABNORMAL HIGH (ref 11.2–14.5)
WBC: 28.6 10*3/uL — ABNORMAL HIGH (ref 3.9–10.3)
lymph#: 1.8 10*3/uL (ref 0.9–3.3)
nRBC: 0 % (ref 0–0)

## 2015-03-29 LAB — TECHNOLOGIST REVIEW

## 2015-03-29 NOTE — Progress Notes (Signed)
  [place] OFFICE PROGRESS NOTE   Diagnosis: Polycythemia vera  INTERVAL HISTORY:   [name] remains off of hydroxyurea. She complains of dyspnea. She walks for 1 hour each day. The dyspnea occurs at rest. She remains on Xarelto.  Objective:  Vital signs in last 24 hours:  Blood pressure 152/62, pulse 84, temperature 98.1 F (36.7 C), temperature source Oral, resp. rate 20, height 5\' 2"  (1.575 m), weight 120 lb (54.432 kg), SpO2 98 %.    Resp: Lungs clear bilaterally Cardio: Regular rhythm with premature beats GI: The spleen tip is palpable in the left mid abdomen with associated tenderness. Vascular: No leg edema   Lab Results:  Lab Results  Component Value Date   WBC 28.6* 03/29/2015   HGB 13.6 03/29/2015   HCT 46.0 03/29/2015   MCV 65.8* 03/29/2015   PLT 440* 03/29/2015   NEUTROABS 24.0* 03/29/2015     Medications: I have reviewed the patient's current medications.  Assessment/Plan: 1. Polycythemia vera-JAK-2 positive. 2. Iron deficiency secondary to phlebotomy. 3. Bilateral pulmonary embolism 11/27/2014-maintained onxarelto 4. Left lower chest/upper abdominal pain-likely proceed related to pulmonary emboli versus painful splenomegaly 5. Indeterminate solid lesions in the left kidney on CT 12/05/2014-evaluated by urology 6. Thrombocytopenia-new on 03/16/2015, resolved. Potentially related to an increased dose of hydroxyurea    Disposition:  [name] appears stable. The thrombocytopenia has resolved. She will resume hydroxyurea 1 day per week. The hemoglobin/hematocrit are above the goal range. She will begin a series of phlebotomy treatments with follow-up of the CBC.  [name] will return for an office visit 04/26/2015.  Betsy Coder, MD  03/29/2015  12:13 PM

## 2015-03-29 NOTE — Telephone Encounter (Signed)
s.w. pt and advised on OCT and NOV

## 2015-03-30 ENCOUNTER — Ambulatory Visit (HOSPITAL_BASED_OUTPATIENT_CLINIC_OR_DEPARTMENT_OTHER): Payer: Medicare Other

## 2015-03-30 VITALS — BP 145/49 | HR 77 | Temp 97.7°F | Resp 20

## 2015-03-30 DIAGNOSIS — D45 Polycythemia vera: Secondary | ICD-10-CM | POA: Diagnosis present

## 2015-03-30 NOTE — Progress Notes (Signed)
0945 Pt became SOB during phlebotomy; O2 @ 2liters [place] placed VSS 132/43 60-P 100% pulse ox; RR-20; 23ml blood extracted.  Message to Dr. Michaelene Song orders. [place] Per Dr. Benay Spice; explained to pt that from now on we'll just do 216ml out; Pt verbalized understanding; states she sometimes get SOB but "feels fine now"  VSS; drank ginger ale during wait; pt escorted to lobby to daughter waiting.  Left AC dressing CD&I.

## 2015-03-30 NOTE — Patient Instructions (Signed)
Therapeutic Phlebotomy, Care After  Refer to this sheet in the next few weeks. These instructions provide you with information about caring for yourself after your procedure. Your health care provider may also give you more specific instructions. Your treatment has been planned according to current medical practices, but problems sometimes occur. Call your health care provider if you have any problems or questions after your procedure.  WHAT TO EXPECT AFTER THE PROCEDURE  After your procedure, it is common to have:   Light-headedness or dizziness. You may feel faint.   Nausea.   Tiredness.  HOME CARE INSTRUCTIONS  Activities   Return to your normal activities as directed by your health care provider. Most people can go back to their normal activities right away.   Avoid strenuous physical activity and heavy lifting or pulling for about 5 hours after the procedure. Do not lift anything that is heavier than 10 lb (4.5 kg).   Athletes should avoid strenuous exercise for at least 12 hours.   Change positions slowly for the remainder of the day. This will help to prevent light-headedness or fainting.   If you feel light-headed, lie down until the feeling goes away.  Eating and Drinking   Be sure to eat well-balanced meals for the next 24 hours.   Drink enough fluid to keep your urine clear or pale yellow.   Avoid drinking alcohol on the day that you had the procedure.  Care of the Needle Insertion Site   Keep your bandage dry. You can remove the bandage after about 5 hours or as directed by your health care provider.   If you have bleeding from the needle insertion site, elevate your arm and press firmly on the site until the bleeding stops.   If you have bruising at the site, apply ice to the area:   Put ice in a plastic bag.   Place a towel between your skin and the bag.   Leave the ice on for 20 minutes, 2-3 times a day for the first 24 hours.   If the swelling does not go away after 24 hours, apply  a warm, moist washcloth to the area for 20 minutes, 2-3 times a day.  General Instructions   Avoid smoking for at least 30 minutes after the procedure.   Keep all follow-up visits as directed by your health care provider. It is important to continue with further therapeutic phlebotomy treatments as directed.  SEEK MEDICAL CARE IF:   You have redness, swelling, or pain at the needle insertion site.   You have fluid, blood, or pus coming from the needle insertion site.   You feel light-headed, dizzy, or nauseated, and the feeling does not go away.   You notice new bruising at the needle insertion site.   You feel weaker than normal.   You have a fever or chills.  SEEK IMMEDIATE MEDICAL CARE IF:   You have severe nausea or vomiting.   You have chest pain.   You have trouble breathing.    This information is not intended to replace advice given to you by your health care provider. Make sure you discuss any questions you have with your health care provider.    Document Released: 11/04/2010 Document Revised: 10/17/2014 Document Reviewed: 05/29/2014  Elsevier Interactive Patient Education 2016 [employer]

## 2015-04-12 ENCOUNTER — Other Ambulatory Visit: Payer: Self-pay | Admitting: Oncology

## 2015-04-12 ENCOUNTER — Ambulatory Visit (HOSPITAL_BASED_OUTPATIENT_CLINIC_OR_DEPARTMENT_OTHER): Payer: Medicare Other

## 2015-04-12 DIAGNOSIS — D45 Polycythemia vera: Secondary | ICD-10-CM

## 2015-04-12 DIAGNOSIS — I2699 Other pulmonary embolism without acute cor pulmonale: Secondary | ICD-10-CM | POA: Diagnosis not present

## 2015-04-12 DIAGNOSIS — D509 Iron deficiency anemia, unspecified: Secondary | ICD-10-CM | POA: Diagnosis not present

## 2015-04-12 LAB — CBC WITH DIFFERENTIAL/PLATELET
BASO%: 0.8 % (ref 0.0–2.0)
Basophils Absolute: 0.2 10*3/uL — ABNORMAL HIGH (ref 0.0–0.1)
EOS ABS: 0.7 10*3/uL — AB (ref 0.0–0.5)
EOS%: 2.9 % (ref 0.0–7.0)
HEMATOCRIT: 44.2 % (ref 34.8–46.6)
HGB: 13.1 g/dL (ref 11.6–15.9)
LYMPH%: 6.7 % — AB (ref 14.0–49.7)
MCH: 19.4 pg — ABNORMAL LOW (ref 25.1–34.0)
MCHC: 29.6 g/dL — AB (ref 31.5–36.0)
MCV: 65.6 fL — ABNORMAL LOW (ref 79.5–101.0)
MONO#: 1.6 10*3/uL — AB (ref 0.1–0.9)
MONO%: 7 % (ref 0.0–14.0)
NEUT%: 82.6 % — AB (ref 38.4–76.8)
NEUTROS ABS: 19.1 10*3/uL — AB (ref 1.5–6.5)
PLATELETS: 193 10*3/uL (ref 145–400)
RBC: 6.74 10*6/uL — ABNORMAL HIGH (ref 3.70–5.45)
RDW: 24.7 % — ABNORMAL HIGH (ref 11.2–14.5)
WBC: 23.2 10*3/uL — AB (ref 3.9–10.3)
lymph#: 1.6 10*3/uL (ref 0.9–3.3)
nRBC: 0 % (ref 0–0)

## 2015-04-12 LAB — TECHNOLOGIST REVIEW

## 2015-04-12 NOTE — Progress Notes (Signed)
Per Dr. Benay Spice, removed 1/2 unit today only.    250gm removed over approximately 5 minutes via right AC with phleb. Kit.   Pt tolerated well.  Observed 30 minutes post procedure, pt refused refreshments.

## 2015-04-12 NOTE — Patient Instructions (Signed)

## 2015-04-26 ENCOUNTER — Other Ambulatory Visit: Payer: Self-pay | Admitting: Nurse Practitioner

## 2015-04-26 ENCOUNTER — Ambulatory Visit (HOSPITAL_BASED_OUTPATIENT_CLINIC_OR_DEPARTMENT_OTHER): Payer: Medicare Other

## 2015-04-26 ENCOUNTER — Telehealth: Payer: Self-pay | Admitting: *Deleted

## 2015-04-26 ENCOUNTER — Telehealth: Payer: Self-pay | Admitting: Nurse Practitioner

## 2015-04-26 ENCOUNTER — Other Ambulatory Visit (HOSPITAL_BASED_OUTPATIENT_CLINIC_OR_DEPARTMENT_OTHER): Payer: Medicare Other

## 2015-04-26 ENCOUNTER — Ambulatory Visit (HOSPITAL_BASED_OUTPATIENT_CLINIC_OR_DEPARTMENT_OTHER): Payer: Medicare Other | Admitting: Nurse Practitioner

## 2015-04-26 VITALS — BP 142/49 | HR 86 | Temp 97.8°F | Resp 18 | Ht 62.0 in | Wt 119.3 lb

## 2015-04-26 VITALS — BP 128/38 | HR 84 | Temp 97.6°F | Resp 20

## 2015-04-26 DIAGNOSIS — I2699 Other pulmonary embolism without acute cor pulmonale: Secondary | ICD-10-CM

## 2015-04-26 DIAGNOSIS — D696 Thrombocytopenia, unspecified: Secondary | ICD-10-CM

## 2015-04-26 DIAGNOSIS — D45 Polycythemia vera: Secondary | ICD-10-CM

## 2015-04-26 DIAGNOSIS — D508 Other iron deficiency anemias: Secondary | ICD-10-CM

## 2015-04-26 DIAGNOSIS — Z7901 Long term (current) use of anticoagulants: Secondary | ICD-10-CM

## 2015-04-26 LAB — CBC WITH DIFFERENTIAL/PLATELET
BASO%: 0.9 % (ref 0.0–2.0)
Basophils Absolute: 0.3 10*3/uL — ABNORMAL HIGH (ref 0.0–0.1)
EOS%: 3.8 % (ref 0.0–7.0)
Eosinophils Absolute: 1.1 10*3/uL — ABNORMAL HIGH (ref 0.0–0.5)
HCT: 43.9 % (ref 34.8–46.6)
HGB: 13 g/dL (ref 11.6–15.9)
LYMPH%: 5.9 % — AB (ref 14.0–49.7)
MCH: 19.3 pg — AB (ref 25.1–34.0)
MCHC: 29.6 g/dL — AB (ref 31.5–36.0)
MCV: 65.1 fL — AB (ref 79.5–101.0)
MONO#: 1.9 10*3/uL — ABNORMAL HIGH (ref 0.1–0.9)
MONO%: 6.6 % (ref 0.0–14.0)
NEUT#: 24 10*3/uL — ABNORMAL HIGH (ref 1.5–6.5)
NEUT%: 82.8 % — AB (ref 38.4–76.8)
Platelets: 100 10*3/uL — ABNORMAL LOW (ref 145–400)
RBC: 6.74 10*6/uL — AB (ref 3.70–5.45)
RDW: 24.6 % — ABNORMAL HIGH (ref 11.2–14.5)
WBC: 29 10*3/uL — ABNORMAL HIGH (ref 3.9–10.3)
lymph#: 1.7 10*3/uL (ref 0.9–3.3)
nRBC: 1 % — ABNORMAL HIGH (ref 0–0)

## 2015-04-26 LAB — TECHNOLOGIST REVIEW

## 2015-04-26 MED ORDER — HYDROXYUREA 500 MG PO CAPS: ORAL_CAPSULE | ORAL | Status: DC

## 2015-04-26 NOTE — Telephone Encounter (Signed)
per pof to sch pt appt-gave pt copy of avs-sent MW email to sch phlebotomy appt

## 2015-04-26 NOTE — Telephone Encounter (Signed)
Per staff message and POF I have scheduled appts. Advised scheduler of appts. JMW  

## 2015-04-26 NOTE — Patient Instructions (Signed)
Decrease Hydrea to 1 capsule (500 mg ) once a week on Thursdays

## 2015-04-26 NOTE — Progress Notes (Signed)
  [facility] OFFICE PROGRESS NOTE   Diagnosis:  Polycythemia vera  INTERVAL HISTORY:   [name] returns as scheduled. She resumed Hydrea 1 day per week following office visit 03/29/2015. Hydrea was increased to 2 days per week 04/12/2015. She began a series of phlebotomy treatments on 03/30/2015. She had a second phlebotomy treatment on 04/12/2015.  She continues to have intermittent dyspnea. No nausea or vomiting. No bleeding. No mouth sores. She continues Xarelto.  Objective:  Vital signs in last 24 hours:  Blood pressure 142/49, pulse 86, temperature 97.8 F (36.6 C), temperature source Oral, resp. rate 18, height 5\' 2"  (1.575 m), weight 119 lb 4.8 oz (54.114 kg), SpO2 99 %.    HEENT: No thrush or ulcers. Resp: Lungs clear bilaterally. Cardio: Regular rate and rhythm. GI: Abdomen soft. Mild tenderness left upper abdomen.  Vascular: No leg edema.   Lab Results:  Lab Results  Component Value Date   WBC 23.2* 04/12/2015   HGB 13.1 04/12/2015   HCT 44.2 04/12/2015   MCV 65.6* 04/12/2015   PLT 193 04/12/2015   NEUTROABS 19.1* 04/12/2015    Imaging:  No results found.  Medications: I have reviewed the patient's current medications.  Assessment/Plan: 1. Polycythemia vera-JAK-2 positive. 2. Iron deficiency secondary to phlebotomy. 3. Bilateral pulmonary embolism 11/27/2014-maintained onxarelto 4. Left lower chest/upper abdominal pain-likely proceed related to pulmonary emboli versus painful splenomegaly 5. Indeterminate solid lesions in the left kidney on CT 12/05/2014-evaluated by urology 6. Thrombocytopenia-new on 03/16/2015, resolved. Potentially related to an increased dose of hydroxyurea. Recurrent mild thrombocytopenia 04/26/2015.   Disposition: [name] appears stable. Plan to continue phlebotomy with CBC every 2 weeks.  The platelet count is decreased coinciding with increasing the Hydrea from 1 time per week to 2 times per week 2  weeks ago. She was instructed to decrease the Hydrea to 500 mg one time per week on Thursdays.  She will return for a follow-up visit in 6 weeks. She will contact the office in the interim with any problems.  Plan reviewed with Dr. Benay Spice.    Ned Card ANP/GNP-BC   04/26/2015  9:40 AM

## 2015-04-26 NOTE — Patient Instructions (Signed)

## 2015-04-30 ENCOUNTER — Telehealth: Payer: Self-pay | Admitting: Oncology

## 2015-04-30 NOTE — Telephone Encounter (Signed)
per pof to sch pt appt-cld & spoke topt and gave next appt time & date

## 2015-05-01 DIAGNOSIS — R413 Other amnesia: Secondary | ICD-10-CM | POA: Diagnosis not present

## 2015-05-01 DIAGNOSIS — G4700 Insomnia, unspecified: Secondary | ICD-10-CM | POA: Diagnosis not present

## 2015-05-01 DIAGNOSIS — R040 Epistaxis: Secondary | ICD-10-CM | POA: Diagnosis not present

## 2015-05-06 ENCOUNTER — Other Ambulatory Visit: Payer: Self-pay | Admitting: Oncology

## 2015-05-09 ENCOUNTER — Other Ambulatory Visit: Payer: Self-pay | Admitting: Medical Oncology

## 2015-05-09 ENCOUNTER — Ambulatory Visit: Payer: Medicare Other

## 2015-05-09 ENCOUNTER — Other Ambulatory Visit (HOSPITAL_BASED_OUTPATIENT_CLINIC_OR_DEPARTMENT_OTHER): Payer: Medicare Other

## 2015-05-09 ENCOUNTER — Other Ambulatory Visit: Payer: Self-pay | Admitting: Nurse Practitioner

## 2015-05-09 DIAGNOSIS — D45 Polycythemia vera: Secondary | ICD-10-CM

## 2015-05-09 LAB — CBC WITH DIFFERENTIAL/PLATELET
BASO%: 0.5 % (ref 0.0–2.0)
BASOS ABS: 0.2 10*3/uL — AB (ref 0.0–0.1)
EOS%: 3.1 % (ref 0.0–7.0)
Eosinophils Absolute: 1 10*3/uL — ABNORMAL HIGH (ref 0.0–0.5)
HCT: 39.9 % (ref 34.8–46.6)
HGB: 11.6 g/dL (ref 11.6–15.9)
LYMPH#: 1.7 10*3/uL (ref 0.9–3.3)
LYMPH%: 5.4 % — ABNORMAL LOW (ref 14.0–49.7)
MCH: 19.4 pg — AB (ref 25.1–34.0)
MCHC: 29.1 g/dL — ABNORMAL LOW (ref 31.5–36.0)
MCV: 66.7 fL — AB (ref 79.5–101.0)
MONO#: 2.3 10*3/uL — ABNORMAL HIGH (ref 0.1–0.9)
MONO%: 7 % (ref 0.0–14.0)
NEUT#: 27 10*3/uL — ABNORMAL HIGH (ref 1.5–6.5)
NEUT%: 84 % — AB (ref 38.4–76.8)
Platelets: 70 10*3/uL — ABNORMAL LOW (ref 145–400)
RBC: 5.98 10*6/uL — AB (ref 3.70–5.45)
RDW: 23.9 % — AB (ref 11.2–14.5)
WBC: 32.2 10*3/uL — ABNORMAL HIGH (ref 3.9–10.3)
nRBC: 0 % (ref 0–0)

## 2015-05-09 LAB — TECHNOLOGIST REVIEW

## 2015-05-09 NOTE — Progress Notes (Signed)
Pt hct 39.9 - below parameters for phlebotomy. NO phlebotomy today .Pt discharged to home.

## 2015-05-21 ENCOUNTER — Telehealth: Payer: Self-pay | Admitting: Oncology

## 2015-05-21 NOTE — Telephone Encounter (Signed)
Per BS moved 12/22 f/u out 2-3 weeks - lab/phleb on 12/22 remain. Spoke with patient re change and confirmed appointments for 12/8, 12/22 and additional appointment for 1/5. Added lab with 1/5 visit and also possible phleb as patient is scheduled for lab/phleb q2w.

## 2015-05-24 ENCOUNTER — Other Ambulatory Visit (HOSPITAL_BASED_OUTPATIENT_CLINIC_OR_DEPARTMENT_OTHER): Payer: Medicare Other

## 2015-05-24 ENCOUNTER — Ambulatory Visit: Payer: Medicare Other

## 2015-05-24 DIAGNOSIS — D508 Other iron deficiency anemias: Secondary | ICD-10-CM | POA: Diagnosis not present

## 2015-05-24 DIAGNOSIS — D45 Polycythemia vera: Secondary | ICD-10-CM

## 2015-05-24 DIAGNOSIS — D696 Thrombocytopenia, unspecified: Secondary | ICD-10-CM

## 2015-05-24 LAB — CBC WITH DIFFERENTIAL/PLATELET
BASO%: 1 % (ref 0.0–2.0)
Basophils Absolute: 0.4 10*3/uL — ABNORMAL HIGH (ref 0.0–0.1)
EOS%: 3.2 % (ref 0.0–7.0)
Eosinophils Absolute: 1.3 10*3/uL — ABNORMAL HIGH (ref 0.0–0.5)
HEMATOCRIT: 38.5 % (ref 34.8–46.6)
HGB: 11.2 g/dL — ABNORMAL LOW (ref 11.6–15.9)
LYMPH%: 4.6 % — AB (ref 14.0–49.7)
MCH: 19 pg — AB (ref 25.1–34.0)
MCHC: 29.1 g/dL — AB (ref 31.5–36.0)
MCV: 65.5 fL — AB (ref 79.5–101.0)
MONO#: 2.7 10*3/uL — AB (ref 0.1–0.9)
MONO%: 6.6 % (ref 0.0–14.0)
NEUT#: 34.8 10*3/uL — ABNORMAL HIGH (ref 1.5–6.5)
NEUT%: 84.6 % — AB (ref 38.4–76.8)
RBC: 5.88 10*6/uL — ABNORMAL HIGH (ref 3.70–5.45)
RDW: 23.6 % — ABNORMAL HIGH (ref 11.2–14.5)
WBC: 41.1 10*3/uL — ABNORMAL HIGH (ref 3.9–10.3)
lymph#: 1.9 10*3/uL (ref 0.9–3.3)
nRBC: 0 % (ref 0–0)

## 2015-05-24 LAB — TECHNOLOGIST REVIEW

## 2015-05-24 NOTE — Progress Notes (Signed)
Per Dr. Gearldine Shown office note on 12/12/14 pt is to receive therapeutic phlebotomy if hematocrit is greater than 43%. Today Hematocrit is 38.5%, no need for phlebotomy at this time. Pt aware and verbalizes understanding. Pt in stable condition and reports no new complaints at this time, labs and schedule printed and given to pt.

## 2015-06-01 ENCOUNTER — Telehealth: Payer: Self-pay

## 2015-06-01 NOTE — Telephone Encounter (Signed)
Daughter called asking if it was OK for mother to have 5 teeth pulled d/t infection today at noon. Pt is on xarelto for PE.

## 2015-06-01 NOTE — Telephone Encounter (Signed)
Called daughter back and per Dr Lindi Adie the pt will need to hold xarelto for 48 hours and can restart the day after the teeth are pulled. Daughter thanked me for my promptness of response.

## 2015-06-04 ENCOUNTER — Ambulatory Visit (INDEPENDENT_AMBULATORY_CARE_PROVIDER_SITE_OTHER): Payer: Medicare Other | Admitting: *Deleted

## 2015-06-04 DIAGNOSIS — I441 Atrioventricular block, second degree: Secondary | ICD-10-CM | POA: Diagnosis not present

## 2015-06-04 NOTE — Progress Notes (Signed)
Remote pacemaker transmission.   

## 2015-06-05 ENCOUNTER — Other Ambulatory Visit: Payer: Self-pay | Admitting: Oncology

## 2015-06-05 LAB — CUP PACEART REMOTE DEVICE CHECK
Battery Impedance: 1326 Ohm
Battery Remaining Longevity: 41 mo
Battery Voltage: 2.77 V
Brady Statistic AS VP Percent: 64 %
Date Time Interrogation Session: 20161219140052
Implantable Lead Implant Date: 20030616
Implantable Lead Location: 753859
Implantable Lead Model: 5076
Lead Channel Impedance Value: 460 Ohm
Lead Channel Sensing Intrinsic Amplitude: 2.8 mV
Lead Channel Setting Pacing Pulse Width: 0.4 ms
Lead Channel Setting Sensing Sensitivity: 5.6 mV
MDC IDC LEAD IMPLANT DT: 20101013
MDC IDC LEAD LOCATION: 753860
MDC IDC LEAD SERIAL: 329051
MDC IDC MSMT LEADCHNL RA IMPEDANCE VALUE: 387 Ohm
MDC IDC SET LEADCHNL RA PACING AMPLITUDE: 2 V
MDC IDC SET LEADCHNL RV PACING AMPLITUDE: 2.5 V
MDC IDC STAT BRADY AP VP PERCENT: 33 %
MDC IDC STAT BRADY AP VS PERCENT: 0 %
MDC IDC STAT BRADY AS VS PERCENT: 3 %

## 2015-06-07 ENCOUNTER — Encounter: Payer: Self-pay | Admitting: Cardiology

## 2015-06-07 ENCOUNTER — Ambulatory Visit: Payer: Medicare Other | Admitting: Oncology

## 2015-06-07 ENCOUNTER — Other Ambulatory Visit: Payer: Medicare Other

## 2015-06-14 DIAGNOSIS — J209 Acute bronchitis, unspecified: Secondary | ICD-10-CM | POA: Diagnosis not present

## 2015-06-21 ENCOUNTER — Ambulatory Visit (HOSPITAL_BASED_OUTPATIENT_CLINIC_OR_DEPARTMENT_OTHER): Payer: Medicare Other | Admitting: Oncology

## 2015-06-21 ENCOUNTER — Other Ambulatory Visit (HOSPITAL_BASED_OUTPATIENT_CLINIC_OR_DEPARTMENT_OTHER): Payer: Medicare Other

## 2015-06-21 ENCOUNTER — Telehealth: Payer: Self-pay | Admitting: Oncology

## 2015-06-21 VITALS — BP 154/48 | HR 81 | Temp 98.2°F | Resp 18 | Ht 62.0 in | Wt 117.1 lb

## 2015-06-21 DIAGNOSIS — D696 Thrombocytopenia, unspecified: Secondary | ICD-10-CM | POA: Diagnosis not present

## 2015-06-21 DIAGNOSIS — K625 Hemorrhage of anus and rectum: Secondary | ICD-10-CM | POA: Diagnosis not present

## 2015-06-21 DIAGNOSIS — E611 Iron deficiency: Secondary | ICD-10-CM

## 2015-06-21 DIAGNOSIS — D45 Polycythemia vera: Secondary | ICD-10-CM | POA: Diagnosis not present

## 2015-06-21 DIAGNOSIS — I2699 Other pulmonary embolism without acute cor pulmonale: Secondary | ICD-10-CM | POA: Diagnosis not present

## 2015-06-21 LAB — CBC WITH DIFFERENTIAL/PLATELET
BASO%: 0.6 % (ref 0.0–2.0)
Basophils Absolute: 0.2 10*3/uL — ABNORMAL HIGH (ref 0.0–0.1)
EOS ABS: 1.5 10*3/uL — AB (ref 0.0–0.5)
EOS%: 4.9 % (ref 0.0–7.0)
HEMATOCRIT: 37.8 % (ref 34.8–46.6)
HGB: 10.9 g/dL — ABNORMAL LOW (ref 11.6–15.9)
LYMPH%: 5.6 % — AB (ref 14.0–49.7)
MCH: 17.9 pg — AB (ref 25.1–34.0)
MCHC: 29 g/dL — AB (ref 31.5–36.0)
MCV: 61.9 fL — ABNORMAL LOW (ref 79.5–101.0)
MONO#: 1.8 10*3/uL — AB (ref 0.1–0.9)
MONO%: 6 % (ref 0.0–14.0)
NEUT%: 82.9 % — ABNORMAL HIGH (ref 38.4–76.8)
NEUTROS ABS: 24.8 10*3/uL — AB (ref 1.5–6.5)
RBC: 6.1 10*6/uL — ABNORMAL HIGH (ref 3.70–5.45)
RDW: 24.6 % — ABNORMAL HIGH (ref 11.2–14.5)
WBC: 29.9 10*3/uL — AB (ref 3.9–10.3)
lymph#: 1.7 10*3/uL (ref 0.9–3.3)

## 2015-06-21 LAB — TECHNOLOGIST REVIEW

## 2015-06-21 NOTE — Telephone Encounter (Signed)
per pf to sch pt appt-gave pt copy of avs-made pt appt w/referral @ Dr Hoyle Sauer office for 1/18 @ 1:30

## 2015-06-21 NOTE — Progress Notes (Signed)
  [facility] OFFICE PROGRESS NOTE   Diagnosis: Polycythemia vera  INTERVAL HISTORY:   Ms. Garfinkle returns as scheduled. She is maintained on hydroxyurea once weekly. She last underwent phlebotomy 04/12/2015. She is currently being treated with an antibiotic for "bronchitis ". She complains of bright red blood per rectum last week. She discontinue Xarelto and the bright red bleeding resolved. For the past few months she reports frequent semi-formed bowel movements better "dark ". No gross blood. She has hemorrhoids.  Objective:  Vital signs in last 24 hours:  Blood pressure 154/48, pulse 81, temperature 98.2 F (36.8 C), temperature source Oral, resp. rate 18, height 5\' 2"  (1.575 m), weight 117 lb 1.6 oz (53.116 kg), SpO2 98 %.    Resp: Lungs clear bilaterally Cardio: Regular rate and rhythm GI: The spleen is palpable in the left subcostal area Vascular: No leg edema  Skin: Resolving ecchymoses at the upper arm bilaterally Rectal: Large soft external hemorrhoid, anal/rectal canal without a mass or blood     Lab Results:  Lab Results  Component Value Date   WBC 29.9* 06/21/2015   HGB 10.9* 06/21/2015   HCT 37.8 06/21/2015   MCV 61.9* 06/21/2015   PLT 131 Large platelets present* 06/21/2015   NEUTROABS 24.8* 06/21/2015     Medications: I have reviewed the patient's current medications.  Assessment/Plan: 1. Polycythemia vera-JAK-2 positive. 2. Iron deficiency secondary to phlebotomy. 3. Bilateral pulmonary embolism 11/27/2014-Xarelto on hold beginning 06/01/2015 secondary to rectal bleeding 4. Left lower chest/upper abdominal pain-likely proceed related to pulmonary emboli versus painful splenomegaly 5. Indeterminate solid lesions in the left kidney on CT 12/05/2014-evaluated by urology       6.   Thrombocytopenia-mild, likely related to hydroxyurea       7.   Rectal bleeding-most likely secondary to hemorrhoids, I will refer her to  GI  Disposition:  Ms. Boser has polycythemia vera. She continues once weekly hydroxyurea. We will continue following the hemoglobin and platelet count closely. She will remain off of Xarelto secondary to recent bleeding. I will refer her to GI to consider the indication for an endoscopic evaluation and for management of the large external hemorrhoid.  Ms. Elbaum will return for an office visit in 3 weeks.  Betsy Coder, MD  06/21/2015  9:14 AM

## 2015-06-28 ENCOUNTER — Other Ambulatory Visit (HOSPITAL_BASED_OUTPATIENT_CLINIC_OR_DEPARTMENT_OTHER): Payer: Medicare Other

## 2015-06-28 DIAGNOSIS — D45 Polycythemia vera: Secondary | ICD-10-CM

## 2015-06-28 LAB — CBC WITH DIFFERENTIAL/PLATELET
BASO%: 0.6 % (ref 0.0–2.0)
BASOS ABS: 0.2 10*3/uL — AB (ref 0.0–0.1)
EOS ABS: 1.4 10*3/uL — AB (ref 0.0–0.5)
EOS%: 4.2 % (ref 0.0–7.0)
HCT: 38.9 % (ref 34.8–46.6)
HEMOGLOBIN: 11.3 g/dL — AB (ref 11.6–15.9)
LYMPH#: 2.2 10*3/uL (ref 0.9–3.3)
LYMPH%: 6.3 % — ABNORMAL LOW (ref 14.0–49.7)
MCH: 18.1 pg — AB (ref 25.1–34.0)
MCHC: 29 g/dL — ABNORMAL LOW (ref 31.5–36.0)
MCV: 62.3 fL — AB (ref 79.5–101.0)
MONO#: 2.4 10*3/uL — ABNORMAL HIGH (ref 0.1–0.9)
MONO%: 7 % (ref 0.0–14.0)
NEUT#: 27.9 10*3/uL — ABNORMAL HIGH (ref 1.5–6.5)
NEUT%: 81.9 % — AB (ref 38.4–76.8)
PLATELETS: 315 10*3/uL (ref 145–400)
RBC: 6.24 10*6/uL — ABNORMAL HIGH (ref 3.70–5.45)
RDW: 23.4 % — ABNORMAL HIGH (ref 11.2–14.5)
WBC: 34.1 10*3/uL — ABNORMAL HIGH (ref 3.9–10.3)
nRBC: 1 % — ABNORMAL HIGH (ref 0–0)

## 2015-06-28 LAB — TECHNOLOGIST REVIEW

## 2015-06-29 ENCOUNTER — Telehealth: Payer: Self-pay | Admitting: *Deleted

## 2015-06-29 NOTE — Telephone Encounter (Signed)
-----   Message from Ladell Pier, MD sent at 06/28/2015  3:56 PM EST ----- Please call patient, hb is better, f/u as scheduled

## 2015-07-02 ENCOUNTER — Telehealth: Payer: Self-pay | Admitting: *Deleted

## 2015-07-02 NOTE — Telephone Encounter (Signed)
-----   Message from Ladell Pier, MD sent at 06/28/2015  3:56 PM EST ----- Please call patient, hb is better, f/u as scheduled

## 2015-07-02 NOTE — Telephone Encounter (Signed)
Per Dr. Benay Spice; left voice message that hb is better; f/u as scheduled; any questions to call office.

## 2015-07-04 DIAGNOSIS — K625 Hemorrhage of anus and rectum: Secondary | ICD-10-CM | POA: Diagnosis not present

## 2015-07-04 DIAGNOSIS — K644 Residual hemorrhoidal skin tags: Secondary | ICD-10-CM | POA: Diagnosis not present

## 2015-07-04 DIAGNOSIS — Z8719 Personal history of other diseases of the digestive system: Secondary | ICD-10-CM | POA: Diagnosis not present

## 2015-07-04 DIAGNOSIS — D649 Anemia, unspecified: Secondary | ICD-10-CM | POA: Diagnosis not present

## 2015-07-05 DIAGNOSIS — R0600 Dyspnea, unspecified: Secondary | ICD-10-CM | POA: Diagnosis not present

## 2015-07-05 DIAGNOSIS — J329 Chronic sinusitis, unspecified: Secondary | ICD-10-CM | POA: Diagnosis not present

## 2015-07-05 DIAGNOSIS — H900 Conductive hearing loss, bilateral: Secondary | ICD-10-CM | POA: Diagnosis not present

## 2015-07-05 DIAGNOSIS — H6122 Impacted cerumen, left ear: Secondary | ICD-10-CM | POA: Diagnosis not present

## 2015-07-12 ENCOUNTER — Telehealth: Payer: Self-pay | Admitting: Nurse Practitioner

## 2015-07-12 ENCOUNTER — Other Ambulatory Visit (HOSPITAL_BASED_OUTPATIENT_CLINIC_OR_DEPARTMENT_OTHER): Payer: Medicare Other

## 2015-07-12 ENCOUNTER — Ambulatory Visit (HOSPITAL_BASED_OUTPATIENT_CLINIC_OR_DEPARTMENT_OTHER): Payer: Medicare Other | Admitting: Nurse Practitioner

## 2015-07-12 VITALS — BP 132/43 | HR 86 | Temp 98.3°F | Resp 18 | Ht 62.0 in | Wt 116.9 lb

## 2015-07-12 DIAGNOSIS — I2699 Other pulmonary embolism without acute cor pulmonale: Secondary | ICD-10-CM

## 2015-07-12 DIAGNOSIS — D508 Other iron deficiency anemias: Secondary | ICD-10-CM

## 2015-07-12 DIAGNOSIS — D696 Thrombocytopenia, unspecified: Secondary | ICD-10-CM

## 2015-07-12 DIAGNOSIS — D45 Polycythemia vera: Secondary | ICD-10-CM | POA: Diagnosis not present

## 2015-07-12 DIAGNOSIS — K625 Hemorrhage of anus and rectum: Secondary | ICD-10-CM

## 2015-07-12 LAB — CBC WITH DIFFERENTIAL/PLATELET
BASO%: 1.1 % (ref 0.0–2.0)
BASOS ABS: 0.5 10*3/uL — AB (ref 0.0–0.1)
EOS ABS: 1.5 10*3/uL — AB (ref 0.0–0.5)
EOS%: 3.5 % (ref 0.0–7.0)
HEMATOCRIT: 38.4 % (ref 34.8–46.6)
HGB: 11.2 g/dL — ABNORMAL LOW (ref 11.6–15.9)
LYMPH%: 4.5 % — AB (ref 14.0–49.7)
MCH: 17.7 pg — ABNORMAL LOW (ref 25.1–34.0)
MCHC: 29.3 g/dL — ABNORMAL LOW (ref 31.5–36.0)
MCV: 60.3 fL — AB (ref 79.5–101.0)
MONO#: 2.5 10*3/uL — AB (ref 0.1–0.9)
MONO%: 5.8 % (ref 0.0–14.0)
NEUT#: 36.4 10*3/uL — ABNORMAL HIGH (ref 1.5–6.5)
NEUT%: 85.1 % — ABNORMAL HIGH (ref 38.4–76.8)
NRBC: 0 % (ref 0–0)
RBC: 6.37 10*6/uL — AB (ref 3.70–5.45)
RDW: 25.6 % — AB (ref 11.2–14.5)
WBC: 42.8 10*3/uL — ABNORMAL HIGH (ref 3.9–10.3)
lymph#: 1.9 10*3/uL (ref 0.9–3.3)

## 2015-07-12 LAB — TECHNOLOGIST REVIEW

## 2015-07-12 NOTE — Telephone Encounter (Signed)
Pt confirmed labs/ov per 01/26 POF, gave pt AVS and Calendar... KJ °

## 2015-07-12 NOTE — Progress Notes (Addendum)
  [place] OFFICE PROGRESS NOTE   Diagnosis:  Polycythemia vera  INTERVAL HISTORY:   [name] returns as scheduled. She continues hydroxyurea 500 mg weekly. She last underwent phlebotomy 04/12/2015. No further rectal bleeding. She has seen gastroenterology regarding hemorrhoids. She has a follow-up appointment in early February.  She continues to have intermittent dyspnea. No leg swelling or calf pain.  Objective:  Vital signs in last 24 hours:  Blood pressure 132/43, pulse 86, temperature 98.3 F (36.8 C), temperature source Oral, resp. rate 18, height 5\' 2"  (1.575 m), weight 116 lb 14.4 oz (53.025 kg), SpO2 100 %.    HEENT:  No thrush or ulcers. Resp:  Lungs clear bilaterally. Cardio:  Regular rate and rhythm. GI:  Abdomen soft. Spleen palpable 3-4 finger breadths below the left costal margin with associated tenderness. Vascular:  No leg edema. Calves soft and nontender.  Lab Results:  Lab Results  Component Value Date   WBC 42.8* 07/12/2015   HGB 11.2* 07/12/2015   HCT 38.4 07/12/2015   MCV 60.3* 07/12/2015   PLT 258 Large & giant platelets 07/12/2015   NEUTROABS 36.4* 07/12/2015    Imaging:  No results found.  Medications: I have reviewed the patient's current medications.  Assessment/Plan: 1. Polycythemia vera-JAK-2 positive. 2. Iron deficiency secondary to phlebotomy. 3. Bilateral pulmonary embolism 11/27/2014-Xarelto on hold beginning 06/01/2015 secondary to rectal bleeding 4. Left lower chest/upper abdominal pain-likely proceed related to pulmonary emboli versus painful splenomegaly 5. Indeterminate solid lesions in the left kidney on CT 12/05/2014-evaluated by urology 6. Thrombocytopenia-mild, likely related to hydroxyurea.  Normal range 07/12/2015. 7. Rectal bleeding-most likely secondary to hemorrhoids.  She has been evaluated by gastroenterology.   Disposition: [name] will continue once weekly hydroxyurea. We will obtain a  repeat CBC in one  month.  Xarelto was discontinued due to rectal bleeding. She understands she is at increased risk for blood clots due to the polycythemia vera. We discussed resuming anticoagulation with Coumadin. She declines to resume anticoagulation. She understands a blood clot can be life-threatening and is willing to take this risk.  She will return for a follow-up visit and labs in one month. She will contact the office the interim with any problems. We specifically discussed signs of a blood clot.   patient seen with Dr. Benay Spice.   Ned Card ANP/GNP-BC   07/12/2015  10:49 AM   This was a shared visit with Ned Card. [name] declines further anticoagulation therapy. We discussed the risk of recurrent thromboembolic disease with [name] and her son.  [name], M.D.

## 2015-07-18 DIAGNOSIS — K644 Residual hemorrhoidal skin tags: Secondary | ICD-10-CM | POA: Diagnosis not present

## 2015-07-28 ENCOUNTER — Other Ambulatory Visit: Payer: Self-pay | Admitting: Oncology

## 2015-07-30 DIAGNOSIS — R002 Palpitations: Secondary | ICD-10-CM | POA: Diagnosis not present

## 2015-07-30 DIAGNOSIS — R0600 Dyspnea, unspecified: Secondary | ICD-10-CM | POA: Diagnosis not present

## 2015-07-30 DIAGNOSIS — R0989 Other specified symptoms and signs involving the circulatory and respiratory systems: Secondary | ICD-10-CM | POA: Diagnosis not present

## 2015-07-30 DIAGNOSIS — G3184 Mild cognitive impairment, so stated: Secondary | ICD-10-CM | POA: Diagnosis not present

## 2015-08-02 ENCOUNTER — Telehealth: Payer: Self-pay | Admitting: Nurse Practitioner

## 2015-08-02 ENCOUNTER — Other Ambulatory Visit: Payer: Self-pay | Admitting: *Deleted

## 2015-08-02 NOTE — Telephone Encounter (Signed)
Lft msg for pt confirming labs/ov r/s same day due to MD schedule... Mailed out schedule... KJ

## 2015-08-06 ENCOUNTER — Institutional Professional Consult (permissible substitution): Payer: Medicare Other | Admitting: Pulmonary Disease

## 2015-08-08 ENCOUNTER — Telehealth: Payer: Self-pay | Admitting: Oncology

## 2015-08-08 NOTE — Telephone Encounter (Signed)
Pt daughter Juliann Pulse called to r/s her mom's 2/23 appt to 2/24 due to transportation issues

## 2015-08-09 ENCOUNTER — Ambulatory Visit: Payer: Medicare Other | Admitting: Nurse Practitioner

## 2015-08-09 ENCOUNTER — Other Ambulatory Visit: Payer: Medicare Other

## 2015-08-10 ENCOUNTER — Telehealth: Payer: Self-pay | Admitting: Nurse Practitioner

## 2015-08-10 ENCOUNTER — Ambulatory Visit (HOSPITAL_BASED_OUTPATIENT_CLINIC_OR_DEPARTMENT_OTHER): Payer: Medicare Other | Admitting: Nurse Practitioner

## 2015-08-10 ENCOUNTER — Other Ambulatory Visit (HOSPITAL_BASED_OUTPATIENT_CLINIC_OR_DEPARTMENT_OTHER): Payer: Medicare Other

## 2015-08-10 VITALS — BP 116/62 | HR 79 | Temp 98.3°F | Resp 18 | Ht 62.0 in | Wt 116.4 lb

## 2015-08-10 DIAGNOSIS — D696 Thrombocytopenia, unspecified: Secondary | ICD-10-CM | POA: Diagnosis not present

## 2015-08-10 DIAGNOSIS — D509 Iron deficiency anemia, unspecified: Secondary | ICD-10-CM | POA: Diagnosis not present

## 2015-08-10 DIAGNOSIS — D45 Polycythemia vera: Secondary | ICD-10-CM

## 2015-08-10 DIAGNOSIS — I2699 Other pulmonary embolism without acute cor pulmonale: Secondary | ICD-10-CM

## 2015-08-10 LAB — CBC WITH DIFFERENTIAL/PLATELET
BASO%: 0.6 % (ref 0.0–2.0)
Basophils Absolute: 0.2 10*3/uL — ABNORMAL HIGH (ref 0.0–0.1)
EOS ABS: 1 10*3/uL — AB (ref 0.0–0.5)
EOS%: 3.8 % (ref 0.0–7.0)
HEMATOCRIT: 37.9 % (ref 34.8–46.6)
HEMOGLOBIN: 11 g/dL — AB (ref 11.6–15.9)
LYMPH#: 1.4 10*3/uL (ref 0.9–3.3)
LYMPH%: 5.3 % — ABNORMAL LOW (ref 14.0–49.7)
MCH: 18 pg — ABNORMAL LOW (ref 25.1–34.0)
MCHC: 29 g/dL — ABNORMAL LOW (ref 31.5–36.0)
MCV: 61.9 fL — ABNORMAL LOW (ref 79.5–101.0)
MONO#: 1.4 10*3/uL — AB (ref 0.1–0.9)
MONO%: 5.4 % (ref 0.0–14.0)
NEUT#: 22.5 10*3/uL — ABNORMAL HIGH (ref 1.5–6.5)
NEUT%: 84.9 % — AB (ref 38.4–76.8)
PLATELETS: 392 10*3/uL (ref 145–400)
RBC: 6.12 10*6/uL — ABNORMAL HIGH (ref 3.70–5.45)
RDW: 24.8 % — ABNORMAL HIGH (ref 11.2–14.5)
WBC: 26.5 10*3/uL — ABNORMAL HIGH (ref 3.9–10.3)
nRBC: 0 % (ref 0–0)

## 2015-08-10 NOTE — Progress Notes (Signed)
  [facility] OFFICE PROGRESS NOTE   Diagnosis:  Polycythemia vera  INTERVAL HISTORY:   [name] returns as scheduled. She continues hydroxyurea 500 mg weekly. She overall feels well. No change in baseline intermittent dyspnea. No leg swelling. No chest pain. She denies bleeding. No nausea or vomiting. No rash.  Objective:  Vital signs in last 24 hours:  Blood pressure 116/62, pulse 79, temperature 98.3 F (36.8 C), temperature source Oral, resp. rate 18, height 5\' 2"  (1.575 m), weight 116 lb 6.4 oz (52.799 kg), SpO2 98 %.    HEENT: No thrush or ulcers. Resp: Lungs clear bilaterally. Cardio: Regular rate and rhythm. GI: Abdomen soft. Spleen palpable approximately 2 fingerbreadths below the left costal margin with associated tenderness. Vascular: No leg edema.    Lab Results:  Lab Results  Component Value Date   WBC 26.5* 08/10/2015   HGB 11.0* 08/10/2015   HCT 37.9 08/10/2015   MCV 61.9* 08/10/2015   PLT 392 08/10/2015   NEUTROABS 22.5* 08/10/2015    Imaging:  No results found.  Medications: I have reviewed the patient's current medications.  Assessment/Plan:  1. Polycythemia vera-JAK-2 positive. 2. Iron deficiency secondary to phlebotomy. 3. Bilateral pulmonary embolism 11/27/2014-Xarelto on hold beginning 06/01/2015 secondary to rectal bleeding 4. Left lower chest/upper abdominal pain-likely proceed related to pulmonary emboli versus painful splenomegaly 5. Indeterminate solid lesions in the left kidney on CT 12/05/2014-evaluated by urology 6. Thrombocytopenia-mild, likely related to hydroxyurea. Normal range 07/12/2015. 7. Rectal bleeding-most likely secondary to hemorrhoids. She has been evaluated by gastroenterology.  Disposition: [name] remains stable from a hematologic standpoint. She will continue hydroxyurea once weekly. She will return for a follow-up CBC in one month. We scheduled a return visit and CBC in 2 months.  Plan  reviewed with Dr. Benay Spice.    Ned Card ANP/GNP-BC   08/10/2015  1:51 PM

## 2015-08-10 NOTE — Telephone Encounter (Signed)
Pt confirmed labs/ov per 02/24 POF, gave pt AVS and Calendar... KJ

## 2015-08-15 ENCOUNTER — Institutional Professional Consult (permissible substitution): Payer: Medicare Other | Admitting: Pulmonary Disease

## 2015-09-03 ENCOUNTER — Ambulatory Visit (INDEPENDENT_AMBULATORY_CARE_PROVIDER_SITE_OTHER): Payer: Medicare Other | Admitting: *Deleted

## 2015-09-03 DIAGNOSIS — I441 Atrioventricular block, second degree: Secondary | ICD-10-CM | POA: Diagnosis not present

## 2015-09-04 NOTE — Progress Notes (Signed)
Remote pacemaker transmission.   

## 2015-09-05 ENCOUNTER — Telehealth: Payer: Self-pay | Admitting: Oncology

## 2015-09-05 ENCOUNTER — Other Ambulatory Visit: Payer: Medicare Other

## 2015-09-05 ENCOUNTER — Other Ambulatory Visit (HOSPITAL_BASED_OUTPATIENT_CLINIC_OR_DEPARTMENT_OTHER): Payer: Medicare Other

## 2015-09-05 ENCOUNTER — Encounter: Payer: Self-pay | Admitting: Cardiology

## 2015-09-05 DIAGNOSIS — D45 Polycythemia vera: Secondary | ICD-10-CM

## 2015-09-05 LAB — CBC WITH DIFFERENTIAL/PLATELET
BASO%: 0.8 % (ref 0.0–2.0)
BASOS ABS: 0.3 10*3/uL — AB (ref 0.0–0.1)
EOS ABS: 1.1 10*3/uL — AB (ref 0.0–0.5)
EOS%: 3.5 % (ref 0.0–7.0)
HCT: 39.1 % (ref 34.8–46.6)
HGB: 11.5 g/dL — ABNORMAL LOW (ref 11.6–15.9)
LYMPH%: 6 % — AB (ref 14.0–49.7)
MCH: 18.2 pg — ABNORMAL LOW (ref 25.1–34.0)
MCHC: 29.4 g/dL — AB (ref 31.5–36.0)
MCV: 61.8 fL — AB (ref 79.5–101.0)
MONO#: 1.7 10*3/uL — ABNORMAL HIGH (ref 0.1–0.9)
MONO%: 5.4 % (ref 0.0–14.0)
NEUT#: 27 10*3/uL — ABNORMAL HIGH (ref 1.5–6.5)
NEUT%: 84.3 % — ABNORMAL HIGH (ref 38.4–76.8)
PLATELETS: 360 10*3/uL (ref 145–400)
RBC: 6.33 10*6/uL — AB (ref 3.70–5.45)
RDW: 25.2 % — AB (ref 11.2–14.5)
WBC: 32 10*3/uL — AB (ref 3.9–10.3)
lymph#: 1.9 10*3/uL (ref 0.9–3.3)
nRBC: 0 % (ref 0–0)

## 2015-09-05 LAB — CUP PACEART REMOTE DEVICE CHECK
Battery Impedance: 1437 Ohm
Battery Remaining Longevity: 38 mo
Battery Voltage: 2.76 V
Date Time Interrogation Session: 20170320155047
Implantable Lead Implant Date: 20030616
Implantable Lead Location: 753859
Implantable Lead Model: 4469
Implantable Lead Model: 5076
Lead Channel Impedance Value: 443 Ohm
Lead Channel Sensing Intrinsic Amplitude: 2.8 mV
Lead Channel Setting Sensing Sensitivity: 5.6 mV
MDC IDC LEAD IMPLANT DT: 20101013
MDC IDC LEAD LOCATION: 753860
MDC IDC LEAD SERIAL: 329051
MDC IDC MSMT LEADCHNL RA IMPEDANCE VALUE: 397 Ohm
MDC IDC SET LEADCHNL RA PACING AMPLITUDE: 2 V
MDC IDC SET LEADCHNL RV PACING AMPLITUDE: 2.5 V
MDC IDC SET LEADCHNL RV PACING PULSEWIDTH: 0.4 ms
MDC IDC STAT BRADY AP VP PERCENT: 26 %
MDC IDC STAT BRADY AP VS PERCENT: 0 %
MDC IDC STAT BRADY AS VP PERCENT: 71 %
MDC IDC STAT BRADY AS VS PERCENT: 3 %

## 2015-09-05 LAB — TECHNOLOGIST REVIEW

## 2015-09-05 NOTE — Telephone Encounter (Signed)
Patient called and left a message with the [facility] office (by accident) wanting to cancel her lab for today, Wednesday 09/05/2015. I called the patient back and cancelled the appt.        [facility].

## 2015-09-05 NOTE — Telephone Encounter (Signed)
s.w pt dtr and r/s cx appt.Marland KitchenMarland KitchenMarland Kitchen

## 2015-09-10 ENCOUNTER — Ambulatory Visit (INDEPENDENT_AMBULATORY_CARE_PROVIDER_SITE_OTHER)
Admission: RE | Admit: 2015-09-10 | Discharge: 2015-09-10 | Disposition: A | Discharge status: Home / Self Care | Payer: Medicare Other | Source: Ambulatory Visit | Attending: Pulmonary Disease | Admitting: Pulmonary Disease

## 2015-09-10 ENCOUNTER — Encounter: Payer: Self-pay | Admitting: Pulmonary Disease

## 2015-09-10 ENCOUNTER — Ambulatory Visit (INDEPENDENT_AMBULATORY_CARE_PROVIDER_SITE_OTHER): Payer: Medicare Other | Admitting: Pulmonary Disease

## 2015-09-10 VITALS — BP 128/62 | HR 63 | Temp 97.0°F | Ht 63.0 in | Wt 116.6 lb

## 2015-09-10 DIAGNOSIS — R0600 Dyspnea, unspecified: Secondary | ICD-10-CM

## 2015-09-10 DIAGNOSIS — R0602 Shortness of breath: Secondary | ICD-10-CM | POA: Diagnosis not present

## 2015-09-10 DIAGNOSIS — I10 Essential (primary) hypertension: Secondary | ICD-10-CM | POA: Diagnosis not present

## 2015-09-10 DIAGNOSIS — Z86711 Personal history of pulmonary embolism: Secondary | ICD-10-CM | POA: Diagnosis not present

## 2015-09-10 DIAGNOSIS — D45 Polycythemia vera: Secondary | ICD-10-CM

## 2015-09-10 DIAGNOSIS — Z950 Presence of cardiac pacemaker: Secondary | ICD-10-CM

## 2015-09-10 DIAGNOSIS — I441 Atrioventricular block, second degree: Secondary | ICD-10-CM | POA: Diagnosis not present

## 2015-09-10 NOTE — Progress Notes (Signed)
Subjective:     Patient ID: [name], female   DOB: July 12, 1922, 82 y.o.   MRN: [id number]  HPI ~  September 10, 2015:  Initial Pulmonary consultation by SN>   48 y/o WF referred by Dr.Cynthia Lorie [facility], for a pulmonary evaluation due to shortness of breath> she has a hx of HBP, CHB- s/p pacer (followed by DrAllred), prev stroke, Polycythemia Vera (followed by DrSherrill)...    She was [facility] 11/2014 w/ DOE (?44mo hx), left shoulder pain & left sided abd pain> ER eval revealed she was Afeb, VSS, O2sat=98% on RA, (D-dimer not done), and CT AngioChest showed bilat acute pulm emboli w/ mod clot burden- she was known to has PVera but no other risk factors for PE & no source found on CT Abd & 2DEcho (and PA pressure appeared wnl);  She was placed on Xarelto & did well on therapy but she SOB persisted w/o much change...    She presented to the ER 02/2015 w/ c/o dyspnea (?on-going, ?worse x 44mo), weakness & HA; she indicated that she was walking 5min x4 daily & doing satis, the SOB was noted more at rest; CXR was clear & they repeated her CT Angio & it showed resolution of the PE (no acute changes or signif residual from her prev emboli) & no other acute chest findings; they concentrated on her cardiac dis- checked EKG, had pacer check, and referred her back to DrAllred... She was seen by Cards 03/05/15 but he assumed that her SOB was due to the interval PE Dx, felt to be stable from the cardiac standpoint & no changes made;  She continued the Xarelto until Jan2017 & stopped it on her own due to "bleeding"...     Since then she decribes an unusual intermittent pattern of SOB, mostly occuring at rest (eg- when answering the phone & daughter notes that she sounds breathless), but not so much when doing her walking exercise (still 8min 4x/day without deterioration); she tells me family & DrWhite wondered about anxiety but pt refused additional meds and ultimately decided that it was not her nerves (I make note  of the fact that she is NOT describing the characteristic sensation of not getting a deep breath, can't get the air "IN", & not satisfied breathing);  She denies cough, sputum, hemoptysis; no f/c/s and no CP or edema but occas she notes palpitations (skips) & they will make her SOB she admits that "I didn't want to come";  Note that she lives at [facility] now (the transition was stressful for her) but she maintains her walking for 1H per day & exercises w/ their classes etc...   Smoking Hx>  She is a never smoker, but was exposed to signif 2nd hand smoke in the past...   Pulmonary Hx>  She has no hx of signif pulm problems before the PE in Jun2016; no hx asthma, no pneumonia, rare bronchitic infection in the past, no hx TB or known exposure; last CXR was 02/28/15 & clear...   Medical Hx>  Hx HBP, CHB- s/p pacemaker, HL, thyroid nodule, renal cysts, hx stroke/ small vessel dis/ right CBruit, DJD/gout, osteopenia, polycythemia vera  Family Hx>  She was adopted and genetic history is unknown...  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  CXR 09/10/15> heart at upper lim of norm, pacer on right, lungs clear w/ old left rib fxs, NAD...  Spirometry 09/10/15> FVC=2.01 (87%), FEV1=1.48 (94%), %1sec=74, mid-flows are wnl at 113% predicted;  This is an essentially normal spirometry...  Ambulatory Oximetry 09/10/15>  O2sat=99% on RA at rest w/ pulse=90/min;  She ambulated 3 Laps w/ lowest O2sat=98% w/ HR=98/min;  No desaturations w/ exercise...  LABS in Epic> reviewed- CBCs per DrSherrill w/ Hg~11 (MCV=62), [id number],  [id number]; JAK2 mutation pos IMP >>     Chronic dyspnea> with an unusual dyspnea profile- SOB at rest but not w/ ADLs or walking    Hx bilat pulmonary emboli> Dx 11/2014 on CT Angio w/ mod clot burden, treated w/ Xarelto w/ a repeat CT angio 02/2015 showing resolution w/o residual or recurrent embolic dis; she took the Xarelto for 29mo & stopped 06/2015 due to bleeding    No hx underlying lung dis> she is 82 y/o & frail but is a never smoker, no hx any other lung dis, with a clear CXR, & normal Spirometry & ambulatory oximetry tests...    Cardiac Hx>  Followed by DrAllred-- HBP, HL, CHB- s/p pacer, hx stroke & right carotid bruit    Medical Hx>  Followed by DrWhire-- HBP, HL, thyroid nodule, divertics, renal cysts, hx stroke/ small vessel dis/ right CBruit, DJD/gout, osteopenia, polycythemia vera (DrSherrill) PLAN >>     She does not appear to have a signif underlying pulmonary disease process here- never smoker, no hx lung prob other than the PE 11/2014, with clear CXR, normal spirometry, and normal oxygenation;  Furthermore a f/u CT Angio 02/2015 (73mo into her Xarelto Rx) was normal w/o PE residuals/ recurrence/ etc... Her unusual SOB sensation actually preceeded the PE diagnosis and has persisted unchanged despite the successful anticoagulation... I do not have any recommendations for med rx and she is pleased because she does NOT want oxygen or additional medications started... I asked her/daughter to call me should she have any change in her status or any new symptoms...     Past Medical History  Diagnosis Date  . Complete heart block ([place])   . Hypercholesterolemia   . Polycythemia vera(238.4)   . Syncope   . Hypertension   . Stroke ([place])   . Pulmonary emboli ([place])   . Balance problem   . Left bundle branch block   . Osteopenia   . Thyroid nodule   . Hypercalcemia   . Polycythemia   . Leukocytosis   . Gout   . Dyspnea   . GI bleed     Past Surgical History  Procedure Laterality Date   . Pacemaker insertion  03/26/09    MDT Adapta DR, revised for RV lead fracture by Dr Doreatha Lew  . Hand surgery      S/P Carpal tunnel repair  . Appendectomy    . Transthoracic echocardiogram  03/26/2009  . US echocardiography  03/01/2009    EF 55-60%  . Esophagogastroduodenoscopy N/A 11/30/2014    Procedure: ESOPHAGOGASTRODUODENOSCOPY (EGD);  Surgeon: Laurence Spates, MD;  Location: [facility] ENDOSCOPY;  Service: Endoscopy;  Laterality: N/A;    Outpatient Encounter Prescriptions as of 09/10/2015  Medication Sig  . amLODipine (NORVASC) 5 MG tablet Take 5 mg by mouth daily.    . beta carotene w/minerals (OCUVITE) tablet Take 1 tablet  by mouth daily.    . calcium-vitamin D (OSCAL WITH D) 500-200 MG-UNIT per tablet Take 1 tablet by mouth daily.   Marland Kitchen CALCIUM-VITAMIN D PO Take 2 tablets by mouth daily.  Marland Kitchen donepezil (ARICEPT) 5 MG tablet Take 5 mg by mouth at bedtime.  . fluticasone (FLONASE) 50 MCG/ACT nasal spray USE 2 SPRAYS INTO EACH NOSTRIL EVERY DAY  . hydrocortisone (ANUSOL-HC) 25 MG suppository every 14 (fourteen) days. As directed  . hydroxyurea (HYDREA) 500 MG capsule TAKE ONE CAPSULE BY MOUTH ONCE A WEEK ON THURSDAYS  . loratadine (CLARITIN) 10 MG tablet Take 10 mg by mouth daily.  Marland Kitchen omeprazole (PRILOSEC) 40 MG capsule Take 40 mg by mouth daily.  Vladimir Faster Glycol-Propyl Glycol (SYSTANE ULTRA OP) Apply 1 drop to eye 2 (two) times daily.   Marland Kitchen pyridOXINE (VITAMIN B-6) 100 MG tablet Take 100 mg by mouth daily.  . simvastatin (ZOCOR) 10 MG tablet Take 10 mg by mouth at bedtime.   . Wheat Dextrin (BENEFIBER DRINK MIX PO) Take by mouth daily.    No facility-administered encounter medications on file as of 09/10/2015.    Allergies  Allergen Reactions  . Chocolate Other (See Comments)    migraine  . Xarelto [Rivaroxaban]     bleeding  . Pollen Extract Other (See Comments)    sneezing    Immunization History  Administered Date(s) Administered  . Influenza-Unspecified 02/14/2014, 03/09/2015   . Pneumococcal Conjugate-13 11/22/2013  . Pneumococcal Polysaccharide-23 09/28/2012    Family History  Problem Relation Age of Onset  . Adopted: Yes  . Migraines Son   . Heart disease Son     CABGx5  . Stroke Son 51  . Canavan disease Son     prostate  . Pulmonary embolism Daughter     x2 (age 53 & 45)  . Deep vein thrombosis Daughter   . Asthma Daughter   . Migraines Daughter   . Depression Daughter   . Other Daughter     Acid reflux    Social History   Social History  . Marital Status: Widowed    Spouse Name: N/A  . Number of Children: N/A  . Years of Education: N/A   Occupational History  . Retired    Social History Main Topics  . Smoking status: Never Smoker   . Smokeless tobacco: Never Used     Comment: never smoked  . Alcohol Use: No  . Drug Use: No  . Sexual Activity: Not on file   Other Topics Concern  . Not on file   Social History Narrative    Current Medications, Allergies, Past Medical History, Past Surgical History, Family History, and Social History were reviewed in [employer] record.   Review of Systems             All symptoms NEG except where BOLDED >>  Constitutional:  F/C/S, fatigue, anorexia, unexpected weight change. HEENT:  HA, visual changes, hearing loss, earache, nasal symptoms, sore throat, mouth sores, hoarseness. Resp:  cough, sputum, hemoptysis; SOB, tightness, wheezing. Cardio:  CP, palpit, DOE, orthopnea, edema. GI:  N/V/D/C, blood in stool; reflux, abd pain, distention, gas. GU:  dysuria, freq, urgency, hematuria, flank pain, voiding difficulty. MS:  joint pain, swelling, tenderness, decr ROM; neck pain, back pain, etc. Neuro:  HA, tremors, seizures, dizziness, syncope, weakness, numbness, gait abn. Skin:  suspicious lesions or skin rash. Heme:  adenopathy, bruising, bleeding. Psyche:  confusion, agitation, sleep disturbance, hallucinations, anxiety, depression suicidal.   Objective:  Physical Exam       Vital Signs:  Reviewed...  General:  WD, petite, 82 y/o WF in NAD; alert & oriented; pleasant & cooperative... HEENT:  [place]/AT; Conjunctiva- pink, Sclera- nonicteric, EOM-wnl, PERRLA, EACs-clear, TMs-wnl; NOSE-clear; THROAT-clear & wnl. Neck:  Supple w/ fair ROM; no JVD; normal carotid impulses w/ R carotid bruit; no thyromegaly or nodules palpated; no lymphadenopathy. Chest:  Clear to P & A; without wheezes, rales, or rhonchi heard. Heart:  Regular Rhythm; norm S1 & S2 without murmurs, rubs, or gallops detected. Abdomen:  Soft & nontender- no guarding or rebound; normal bowel sounds; no organomegaly or masses palpated. Ext:  decr ROM; without deformities +arthritic changes; no varicose veins, venous insuffic, or edema;  Pulses intact w/o bruits. Neuro:  No focal neuro deficits Derm:  No lesions noted; no rash etc. Lymph:  No cervical, supraclavicular, axillary, or inguinal adenopathy palpated.   Assessment:      IMP >>     Chronic dyspnea> with an unusual dyspnea profile- SOB at rest but not w/ ADLs or walking    Hx bilat pulmonary emboli> Dx 11/2014 on CT Angio w/ mod clot burden, treated w/ Xarelto w/ a repeat CT angio 02/2015 showing resolution w/o residual or recurrent embolic dis; she took the Xarelto for 31mo & stopped 06/2015 due to bleeding    No hx underlying lung dis> she is 82 y/o & frail but is a never smoker, no hx any other lung dis, with a clear CXR, & normal Spirometry & ambulatory oximetry tests...    Cardiac Hx>  Followed by DrAllred-- HBP, HL, CHB- s/p pacer, hx stroke & right carotid bruit    Medical Hx>  Followed by DrWhire-- HBP, HL, thyroid nodule, divertics, renal cysts, hx stroke/ small vessel dis/ right CBruit, DJD/gout, osteopenia, polycythemia vera (DrSherrill)  PLAN >>     She does not appear to have a signif underlying pulmonary disease process here- never smoker, no hx lung prob other than the PE 11/2014, with clear CXR, normal spirometry, and  normal oxygenation;  Furthermore a f/u CT Angio 02/2015 (83mo into her Xarelto Rx) was normal w/o PE residuals/ recurrence/ etc... Her unusual SOB sensation actually preceeded the PE diagnosis and has persisted unchanged despite the successful anticoagulation... I do not have any recommendations for med rx and she is pleased because she does NOT want oxygen or additional medications started... I asked her/daughter to call me should she have any change in her status or any new symptoms...      Plan:     Patient's Medications  New Prescriptions   No medications on file  Previous Medications   AMLODIPINE (NORVASC) 5 MG TABLET    Take 5 mg by mouth daily.     BETA CAROTENE W/MINERALS (OCUVITE) TABLET    Take 1 tablet by mouth daily.     CALCIUM-VITAMIN D (OSCAL WITH D) 500-200 MG-UNIT PER TABLET    Take 1 tablet by mouth daily.    CALCIUM-VITAMIN D PO    Take 2 tablets by mouth daily.   DONEPEZIL (ARICEPT) 5 MG TABLET    Take 5 mg by mouth at bedtime.   FLUTICASONE (FLONASE) 50 MCG/ACT NASAL SPRAY    USE 2 SPRAYS INTO EACH NOSTRIL EVERY DAY   HYDROCORTISONE (ANUSOL-HC) 25 MG SUPPOSITORY    every 14 (fourteen) days. As directed   HYDROXYUREA (HYDREA) 500 MG CAPSULE    TAKE ONE CAPSULE BY MOUTH ONCE A WEEK ON THURSDAYS   LORATADINE (CLARITIN)  10 MG TABLET    Take 10 mg by mouth daily.   OMEPRAZOLE (PRILOSEC) 40 MG CAPSULE    Take 40 mg by mouth daily.   POLYETHYL GLYCOL-PROPYL GLYCOL (SYSTANE ULTRA OP)    Apply 1 drop to eye 2 (two) times daily.    PYRIDOXINE (VITAMIN B-6) 100 MG TABLET    Take 100 mg by mouth daily.   SIMVASTATIN (ZOCOR) 10 MG TABLET    Take 10 mg by mouth at bedtime.    WHEAT DEXTRIN (BENEFIBER DRINK MIX PO)    Take by mouth daily.   Modified Medications   No medications on file  Discontinued Medications   No medications on file

## 2015-09-10 NOTE — Patient Instructions (Signed)
MrsCarraway-- it was a pleasure meeting you today...  We did a careful assessment to rule out any pulmonary problem to account for your shortness of breath episodes...    We repeated your CXR today & we will call w/ these results (recall that your prev CXR 6 months ago was clear)...    We checked a Spirometry breathing test-- it was within normal limits    And finally we checked an Ambulatory Oximetry test-- your oxygen numbers were fantastic!  I would recommend a cardiac follow up visit to check your heart and pacermaker...  Stay as active as possible w/ your exercise program at [facility]...  Watch for any ACUTE increase in your shortness of breath & let me know immediately if this was to happen.Marland KitchenMarland Kitchen

## 2015-09-11 NOTE — Progress Notes (Signed)
Quick Note:  Called and spoke to pt's daughter, Juliann Pulse. Informed her of the results per SN. Juliann Pulse verbalized understanding and denied any further questions or concerns at this time.   ______

## 2015-09-25 DIAGNOSIS — S335XXA Sprain of ligaments of lumbar spine, initial encounter: Secondary | ICD-10-CM | POA: Diagnosis not present

## 2015-09-25 DIAGNOSIS — S233XXA Sprain of ligaments of thoracic spine, initial encounter: Secondary | ICD-10-CM | POA: Diagnosis not present

## 2015-09-25 DIAGNOSIS — S139XXA Sprain of joints and ligaments of unspecified parts of neck, initial encounter: Secondary | ICD-10-CM | POA: Diagnosis not present

## 2015-10-04 ENCOUNTER — Telehealth: Payer: Self-pay | Admitting: Oncology

## 2015-10-04 ENCOUNTER — Ambulatory Visit (HOSPITAL_BASED_OUTPATIENT_CLINIC_OR_DEPARTMENT_OTHER): Payer: Medicare Other | Admitting: Oncology

## 2015-10-04 ENCOUNTER — Other Ambulatory Visit (HOSPITAL_BASED_OUTPATIENT_CLINIC_OR_DEPARTMENT_OTHER): Payer: Medicare Other

## 2015-10-04 VITALS — BP 141/63 | HR 80 | Temp 98.3°F | Resp 18 | Ht 63.0 in | Wt 117.2 lb

## 2015-10-04 DIAGNOSIS — D45 Polycythemia vera: Secondary | ICD-10-CM

## 2015-10-04 LAB — CBC WITH DIFFERENTIAL/PLATELET
BASO%: 0.3 % (ref 0.0–2.0)
Basophils Absolute: 0.1 10*3/uL (ref 0.0–0.1)
EOS ABS: 1 10*3/uL — AB (ref 0.0–0.5)
EOS%: 3.4 % (ref 0.0–7.0)
HCT: 40.3 % (ref 34.8–46.6)
HGB: 11.8 g/dL (ref 11.6–15.9)
LYMPH%: 5.3 % — ABNORMAL LOW (ref 14.0–49.7)
MCH: 18.2 pg — ABNORMAL LOW (ref 25.1–34.0)
MCHC: 29.3 g/dL — ABNORMAL LOW (ref 31.5–36.0)
MCV: 62.1 fL — ABNORMAL LOW (ref 79.5–101.0)
MONO#: 1.7 10*3/uL — AB (ref 0.1–0.9)
MONO%: 5.6 % (ref 0.0–14.0)
NEUT%: 85.4 % — ABNORMAL HIGH (ref 38.4–76.8)
NEUTROS ABS: 25.3 10*3/uL — AB (ref 1.5–6.5)
NRBC: 1 % — AB (ref 0–0)
RBC: 6.49 10*6/uL — AB (ref 3.70–5.45)
RDW: 25.3 % — AB (ref 11.2–14.5)
WBC: 29.7 10*3/uL — AB (ref 3.9–10.3)
lymph#: 1.6 10*3/uL (ref 0.9–3.3)

## 2015-10-04 LAB — TECHNOLOGIST REVIEW

## 2015-10-04 NOTE — Progress Notes (Signed)
  [facility] OFFICE PROGRESS NOTE   Diagnosis: Polycythemia vera  INTERVAL HISTORY:   Ms. Zervas returns as scheduled. She continues hydroxyurea. She recently fell in her bathroom and injured the left shoulder and sacrum.  Objective:  Vital signs in last 24 hours:  Blood pressure 141/63, pulse 80, temperature 98.3 F (36.8 C), temperature source Oral, resp. rate 18, height 5\' 3"  (1.6 m), weight 117 lb 3.2 oz (53.162 kg), SpO2 99 %.    HEENT: No thrush or ulcers Resp: Lungs clear bilaterally Cardio: Regular rate and rhythm GI: The spleen tip is palpable in the left mid abdomen with associated tenderness, no hepatomegaly Vascular: No leg edema  Lab Results:  Lab Results  Component Value Date   WBC 29.7* 10/04/2015   HGB 11.8 10/04/2015   HCT 40.3 10/04/2015   MCV 62.1* 10/04/2015   PLT 154 Large platelets present 10/04/2015   NEUTROABS 25.3* 10/04/2015     Medications: I have reviewed the patient's current medications.  Assessment/Plan: 1. Polycythemia vera-JAK-2 positive. 2. History of Iron deficiency secondary to phlebotomy. 3. Bilateral pulmonary embolism 11/27/2014-Xarelto on hold beginning 06/01/2015 secondary to rectal bleeding 4. Left lower chest/upper abdominal pain-likely related to pulmonary emboli versus painful splenomegaly 5. Indeterminate solid lesions in the left kidney on CT 12/05/2014-evaluated by urology 6. History of Thrombocytopenia-mild, likely related to hydroxyurea.  7. Rectal bleeding-most likely secondary to hemorrhoids. She has been evaluated by gastroenterology.   Disposition:  Ms. Bonis appears stable. She will continue weekly hydroxyurea. She will return for a CBC in 6 weeks and an office visit in 12 weeks.  Betsy Coder, MD  10/04/2015  3:45 PM

## 2015-10-04 NOTE — Telephone Encounter (Signed)
per pof to sch pt appt-gave pt copy of avs °

## 2015-10-19 ENCOUNTER — Other Ambulatory Visit: Payer: Self-pay | Admitting: Oncology

## 2015-10-30 DIAGNOSIS — G3184 Mild cognitive impairment, so stated: Secondary | ICD-10-CM | POA: Diagnosis not present

## 2015-10-30 DIAGNOSIS — E785 Hyperlipidemia, unspecified: Secondary | ICD-10-CM | POA: Diagnosis not present

## 2015-10-30 DIAGNOSIS — F5101 Primary insomnia: Secondary | ICD-10-CM | POA: Diagnosis not present

## 2015-10-30 DIAGNOSIS — R0602 Shortness of breath: Secondary | ICD-10-CM | POA: Diagnosis not present

## 2015-11-08 ENCOUNTER — Ambulatory Visit (INDEPENDENT_AMBULATORY_CARE_PROVIDER_SITE_OTHER): Payer: Medicare Other | Admitting: Nurse Practitioner

## 2015-11-08 ENCOUNTER — Encounter: Payer: Self-pay | Admitting: Internal Medicine

## 2015-11-08 ENCOUNTER — Encounter: Payer: Self-pay | Admitting: Nurse Practitioner

## 2015-11-08 VITALS — BP 142/58 | HR 81 | Ht 62.0 in | Wt 115.0 lb

## 2015-11-08 DIAGNOSIS — I10 Essential (primary) hypertension: Secondary | ICD-10-CM | POA: Diagnosis not present

## 2015-11-08 DIAGNOSIS — R0602 Shortness of breath: Secondary | ICD-10-CM | POA: Diagnosis not present

## 2015-11-08 DIAGNOSIS — I442 Atrioventricular block, complete: Secondary | ICD-10-CM | POA: Diagnosis not present

## 2015-11-08 DIAGNOSIS — I441 Atrioventricular block, second degree: Secondary | ICD-10-CM | POA: Diagnosis not present

## 2015-11-08 LAB — CUP PACEART INCLINIC DEVICE CHECK
Battery Remaining Longevity: 36 mo
Brady Statistic AP VP Percent: 28 %
Brady Statistic AS VS Percent: 3 %
Implantable Lead Implant Date: 20030616
Implantable Lead Location: 753859
Implantable Lead Model: 4469
Lead Channel Impedance Value: 452 Ohm
Lead Channel Pacing Threshold Amplitude: 0.75 V
Lead Channel Pacing Threshold Amplitude: 1 V
Lead Channel Sensing Intrinsic Amplitude: 5.6 mV
MDC IDC LEAD IMPLANT DT: 20101013
MDC IDC LEAD LOCATION: 753860
MDC IDC LEAD SERIAL: 329051
MDC IDC MSMT BATTERY IMPEDANCE: 1552 Ohm
MDC IDC MSMT BATTERY VOLTAGE: 2.77 V
MDC IDC MSMT LEADCHNL RA IMPEDANCE VALUE: 404 Ohm
MDC IDC MSMT LEADCHNL RA PACING THRESHOLD PULSEWIDTH: 0.4 ms
MDC IDC MSMT LEADCHNL RV PACING THRESHOLD PULSEWIDTH: 0.4 ms
MDC IDC SESS DTM: 20170525170129
MDC IDC SET LEADCHNL RA PACING AMPLITUDE: 2 V
MDC IDC SET LEADCHNL RV PACING AMPLITUDE: 2.5 V
MDC IDC SET LEADCHNL RV PACING PULSEWIDTH: 0.4 ms
MDC IDC SET LEADCHNL RV SENSING SENSITIVITY: 5.6 mV
MDC IDC STAT BRADY AP VS PERCENT: 0 %
MDC IDC STAT BRADY AS VP PERCENT: 69 %

## 2015-11-08 NOTE — Patient Instructions (Signed)
Medication Instructions:   Your physician recommends that you continue on your current medications as directed. Please refer to the Current Medication list given to you today.   If you need a refill on your cardiac medications before your next appointment, please call your pharmacy.  Labwork:  NONE ORDER TODAY    Testing/Procedures: NONE ORDER TODAY]  Follow-Up:  Remote monitoring is used to monitor your Pacemaker of ICD from home. This monitoring reduces the number of office visits required to check your device to one time per year. It allows [place] to keep an eye on the functioning of your device to ensure it is working properly. You are scheduled for a device check from home on . 02/07/16..You may send your transmission at any time that day. If you have a wireless device, the transmission will be sent automatically. After your physician reviews your transmission, you will receive a postcard with your next transmission date.  Your physician wants you to follow-up in:  [place] will receive a reminder letter in the mail two months in advance. If you don't receive a letter, please call our office to schedule the follow-up appointment.      Any Other Special Instructions Will Be Listed Below (If Applicable).

## 2015-11-08 NOTE — Progress Notes (Signed)
Electrophysiology Office Note Date: 11/08/2015  ID:  [name], DOB 1923-05-10, MRN [id number]  PCP: Vidal Schwalbe, MD Electrophysiologist: Rayann Heman  CC: shortness of breath  [name] is a 82 y.o. female seen today for Dr Rayann Heman.  She presents today for an add on visit to evaluate for shortness of breath.  She has a history of PE and has had shortness of breath chronically for >1 year.  She was previously on Xarelto for PE but this was stopped by patient 05/2015 for rectal bleeding. She is not willing to go back on [place]. She denies chest pain, palpitations, PND, orthopnea, nausea, vomiting, dizziness, syncope, edema, weight gain, or early satiety.  Device History: MDT dual chamber PPM implanted 2009 for Mobitz II; RV lead revision 2010 for fracture   Past Medical History  Diagnosis Date  . Complete heart block ([place])   . Hypercholesterolemia   . Polycythemia vera(238.4)   . Syncope   . Hypertension   . Stroke ([place])   . Pulmonary emboli ([place])   . Balance problem   . Left bundle branch block   . Osteopenia   . Thyroid nodule   . Hypercalcemia   . Polycythemia   . Leukocytosis   . Gout   . Dyspnea   . GI bleed    Past Surgical History  Procedure Laterality Date  . Pacemaker insertion  03/26/09    MDT Adapta DR, revised for RV lead fracture by Dr Doreatha Lew  . Hand surgery      S/P Carpal tunnel repair  . Appendectomy    . Transthoracic echocardiogram  03/26/2009  . US echocardiography  03/01/2009    EF 55-60%  . Esophagogastroduodenoscopy N/A 11/30/2014    Procedure: ESOPHAGOGASTRODUODENOSCOPY (EGD);  Surgeon: Laurence Spates, MD;  Location: [facility] ENDOSCOPY;  Service: Endoscopy;  Laterality: N/A;    Current Outpatient Prescriptions  Medication Sig Dispense Refill  . amLODipine (NORVASC) 5 MG tablet Take 5 mg by mouth daily.      . beta carotene w/minerals (OCUVITE) tablet Take 1 tablet by mouth daily.      . calcium-vitamin D (OSCAL WITH D) 500-200 MG-UNIT per  tablet Take 1 tablet by mouth daily.     Marland Kitchen CALCIUM-VITAMIN D PO Take 2 tablets by mouth daily.    Marland Kitchen donepezil (ARICEPT) 5 MG tablet Take 5 mg by mouth at bedtime.  2  . fluticasone (FLONASE) 50 MCG/ACT nasal spray USE 2 SPRAYS INTO EACH NOSTRIL EVERY DAY  12  . hydrocortisone (ANUSOL-HC) 25 MG suppository every 14 (fourteen) days. As directed  0  . hydroxyurea (HYDREA) 500 MG capsule TAKE ONE CAPSULE BY MOUTH ONCE A WEEK ON THURSDAYS 12 capsule 0  . loratadine (CLARITIN) 10 MG tablet Take 10 mg by mouth daily.    Marland Kitchen omeprazole (PRILOSEC) 40 MG capsule Take 40 mg by mouth daily.    Vladimir Faster Glycol-Propyl Glycol (SYSTANE ULTRA OP) Apply 1 drop to eye 2 (two) times daily.     Marland Kitchen pyridOXINE (VITAMIN B-6) 100 MG tablet Take 100 mg by mouth daily.    . simvastatin (ZOCOR) 10 MG tablet Take 10 mg by mouth at bedtime.     . Wheat Dextrin (BENEFIBER DRINK MIX PO) Take by mouth daily.      No current facility-administered medications for this visit.    Allergies:   Chocolate; Xarelto; and Pollen extract   Social History: Social History   Social History  . Marital Status: Widowed  Spouse Name: N/A  . Number of Children: N/A  . Years of Education: N/A   Occupational History  . Retired    Social History Main Topics  . Smoking status: Never Smoker   . Smokeless tobacco: Never Used     Comment: never smoked  . Alcohol Use: No  . Drug Use: No  . Sexual Activity: Not on file   Other Topics Concern  . Not on file   Social History Narrative    Family History: Family History  Problem Relation Age of Onset  . Adopted: Yes  . Migraines Son   . Heart disease Son     CABGx5  . Stroke Son 78  . Canavan disease Son     prostate  . Pulmonary embolism Daughter     x2 (age 44 & 76)  . Deep vein thrombosis Daughter   . Asthma Daughter   . Migraines Daughter   . Depression Daughter   . Other Daughter     Acid reflux     Review of Systems: All other systems reviewed and are  otherwise negative except as noted above.   Physical Exam: VS:  BP 142/58 mmHg  Pulse 81  Ht 5\' 2"  (1.575 m)  Wt 115 lb (52.164 kg)  BMI 21.03 kg/m2 , BMI Body mass index is 21.03 kg/(m^2).  GEN- The patient is elderly appearing, alert and oriented x 3 today.   HEENT: normocephalic, atraumatic; sclera clear, conjunctiva pink; hearing intact; oropharynx clear; neck supple  Lungs- Clear to ausculation bilaterally, normal work of breathing.  No wheezes, rales, rhonchi Heart- Regular rate and rhythm  GI- soft, non-tender, non-distended, bowel sounds present  Extremities- no clubbing, cyanosis, or edema; DP/PT/radial pulses 2+ bilaterally MS- no significant deformity or atrophy Skin- warm and dry, no rash or lesion; PPM pocket well healed Psych- euthymic mood, full affect Neuro- strength and sensation are intact  PPM Interrogation- reviewed in detail today,  See PACEART report  EKG:  EKG is ordered today. The ekg ordered today shows AV pacing  Recent Labs: 11/28/2014: ALT 18 12/05/2014: B Natriuretic Peptide 214.4* 02/28/2015: BUN 34*; Creatinine, Ser 1.04*; Potassium 4.1; Sodium 138 10/04/2015: HGB 11.8; Platelets 154 Large platelets present   Wt Readings from Last 3 Encounters:  11/08/15 115 lb (52.164 kg)  10/04/15 117 lb 3.2 oz (53.162 kg)  09/10/15 116 lb 9.6 oz (52.889 kg)     Other studies Reviewed: Additional studies/ records that were reviewed today include: Dr Jackalyn Lombard office notes  Assessment and Plan:  1.  Complete heart block Normal PPM function See Pace Art report No changes today  2. Shortness of breath Chronic O2 sats with ambulation today 96% Seen by Dr Lenna Gilford in evaluation with no significant underlying pulmonary process identified. She is not interested in testing or additional medications at this time. With advanced age, I think this is reasonable.   3.  PE Previously on Xarelto - held as of 05/2015 for rectal bleeding Resolved on CT 02/2015  4.   HTN Stable No change required today   Current medicines are reviewed at length with the patient today.   The patient does not have concerns regarding her medicines.  The following changes were made today:  none  Labs/ tests ordered today include:  Orders Placed This Encounter  Procedures  . EKG 12-Lead     Disposition:   Follow up with [facility], Dr Rayann Heman 6 months      Signed, Chanetta Marshall, NP 11/08/2015 3:07 PM  [address] [id number] [phone] (office) [phone] (fax)

## 2015-11-14 ENCOUNTER — Telehealth: Payer: Self-pay | Admitting: Nurse Practitioner

## 2015-11-14 NOTE — Telephone Encounter (Signed)
pt daughter cld to r/s appt-gave r/s time & date for 6/6@10 :15

## 2015-11-15 ENCOUNTER — Other Ambulatory Visit: Payer: Medicare Other

## 2015-11-20 ENCOUNTER — Other Ambulatory Visit (HOSPITAL_BASED_OUTPATIENT_CLINIC_OR_DEPARTMENT_OTHER): Payer: Medicare Other

## 2015-11-20 DIAGNOSIS — D45 Polycythemia vera: Secondary | ICD-10-CM

## 2015-11-20 LAB — CBC WITH DIFFERENTIAL/PLATELET
BASO%: 0.8 % (ref 0.0–2.0)
BASOS ABS: 0.3 10*3/uL — AB (ref 0.0–0.1)
EOS ABS: 1.1 10*3/uL — AB (ref 0.0–0.5)
EOS%: 2.9 % (ref 0.0–7.0)
HEMATOCRIT: 43.2 % (ref 34.8–46.6)
HGB: 12.8 g/dL (ref 11.6–15.9)
LYMPH%: 4.7 % — AB (ref 14.0–49.7)
MCH: 18.4 pg — ABNORMAL LOW (ref 25.1–34.0)
MCHC: 29.6 g/dL — AB (ref 31.5–36.0)
MCV: 62.2 fL — ABNORMAL LOW (ref 79.5–101.0)
MONO#: 2.1 10*3/uL — AB (ref 0.1–0.9)
MONO%: 5.8 % (ref 0.0–14.0)
NEUT%: 85.8 % — AB (ref 38.4–76.8)
NEUTROS ABS: 30.8 10*3/uL — AB (ref 1.5–6.5)
PLATELETS: 287 10*3/uL (ref 145–400)
RBC: 6.94 10*6/uL — ABNORMAL HIGH (ref 3.70–5.45)
RDW: 25.9 % — ABNORMAL HIGH (ref 11.2–14.5)
WBC: 35.9 10*3/uL — AB (ref 3.9–10.3)
lymph#: 1.7 10*3/uL (ref 0.9–3.3)
nRBC: 0 % (ref 0–0)

## 2015-11-23 ENCOUNTER — Telehealth: Payer: Self-pay | Admitting: *Deleted

## 2015-11-23 NOTE — Telephone Encounter (Signed)
Spoke with pt's daughter, lab results given. She voiced understanding.

## 2015-11-23 NOTE — Telephone Encounter (Signed)
-----   Message from Ladell Pier, MD sent at 11/23/2015  4:52 PM EDT ----- Please call patient, same hydrea, f/u as scheduled

## 2015-12-03 DIAGNOSIS — K644 Residual hemorrhoidal skin tags: Secondary | ICD-10-CM | POA: Diagnosis not present

## 2015-12-03 DIAGNOSIS — K625 Hemorrhage of anus and rectum: Secondary | ICD-10-CM | POA: Diagnosis not present

## 2015-12-13 DIAGNOSIS — K649 Unspecified hemorrhoids: Secondary | ICD-10-CM | POA: Diagnosis not present

## 2015-12-13 DIAGNOSIS — K921 Melena: Secondary | ICD-10-CM | POA: Diagnosis not present

## 2015-12-27 ENCOUNTER — Other Ambulatory Visit (HOSPITAL_BASED_OUTPATIENT_CLINIC_OR_DEPARTMENT_OTHER): Payer: Medicare Other

## 2015-12-27 ENCOUNTER — Telehealth: Payer: Self-pay | Admitting: Oncology

## 2015-12-27 ENCOUNTER — Ambulatory Visit (HOSPITAL_BASED_OUTPATIENT_CLINIC_OR_DEPARTMENT_OTHER): Payer: Medicare Other | Admitting: Nurse Practitioner

## 2015-12-27 VITALS — BP 132/58 | HR 80 | Temp 98.0°F | Resp 17 | Ht 62.0 in | Wt 114.4 lb

## 2015-12-27 DIAGNOSIS — D45 Polycythemia vera: Secondary | ICD-10-CM

## 2015-12-27 DIAGNOSIS — Z86711 Personal history of pulmonary embolism: Secondary | ICD-10-CM | POA: Diagnosis not present

## 2015-12-27 LAB — CBC WITH DIFFERENTIAL/PLATELET
BASO%: 0.7 % (ref 0.0–2.0)
BASOS ABS: 0.3 10*3/uL — AB (ref 0.0–0.1)
EOS ABS: 1.3 10*3/uL — AB (ref 0.0–0.5)
EOS%: 3.3 % (ref 0.0–7.0)
HEMATOCRIT: 43.2 % (ref 34.8–46.6)
HEMOGLOBIN: 12.8 g/dL (ref 11.6–15.9)
LYMPH#: 1.8 10*3/uL (ref 0.9–3.3)
LYMPH%: 4.7 % — AB (ref 14.0–49.7)
MCH: 18.4 pg — AB (ref 25.1–34.0)
MCHC: 29.6 g/dL — AB (ref 31.5–36.0)
MCV: 62.2 fL — ABNORMAL LOW (ref 79.5–101.0)
MONO#: 1.8 10*3/uL — AB (ref 0.1–0.9)
MONO%: 4.7 % (ref 0.0–14.0)
NEUT#: 33.2 10*3/uL — ABNORMAL HIGH (ref 1.5–6.5)
NEUT%: 86.6 % — AB (ref 38.4–76.8)
PLATELETS: 433 10*3/uL — AB (ref 145–400)
RBC: 6.95 10*6/uL — ABNORMAL HIGH (ref 3.70–5.45)
RDW: 26.5 % — ABNORMAL HIGH (ref 11.2–14.5)
WBC: 38.4 10*3/uL — ABNORMAL HIGH (ref 3.9–10.3)
nRBC: 1 % — ABNORMAL HIGH (ref 0–0)

## 2015-12-27 LAB — TECHNOLOGIST REVIEW

## 2015-12-27 NOTE — Progress Notes (Signed)
  [facility] OFFICE PROGRESS NOTE   Diagnosis:  Polycythemia vera  INTERVAL HISTORY:   [name] returns as scheduled. She continues hydroxyurea 500 mg weekly on Thursdays. She is short of breath at times. She walks 20 minutes 3 times a day. She continues to have bleeding related to hemorrhoids. She is followed by GI.  Objective:  Vital signs in last 24 hours:  Blood pressure 149/45, pulse 80, temperature 98 F (36.7 C), temperature source Oral, resp. rate 17, height 5\' 2"  (1.575 m), weight 114 lb 6.4 oz (51.891 kg), SpO2 99 %.   Resp: Lungs clear bilaterally. Cardio: Regular rate and rhythm. GI: Abdomen soft and nontender. No hepatomegaly. Spleen palpable 3 fingerbreadths below the left costal margin. Vascular: No leg edema.   Lab Results:  Lab Results  Component Value Date   WBC 38.4* 12/27/2015   HGB 12.8 12/27/2015   HCT 43.2 12/27/2015   MCV 62.2* 12/27/2015   PLT 433* 12/27/2015   NEUTROABS 33.2* 12/27/2015    Imaging:  No results found.  Medications: I have reviewed the patient's current medications.  Assessment/Plan: 1. Polycythemia vera-JAK-2 positive. 2. History of Iron deficiency secondary to phlebotomy. 3. Bilateral pulmonary embolism 11/27/2014-Xarelto on hold beginning 06/01/2015 secondary to rectal bleeding 4. Left lower chest/upper abdominal pain-likely related to pulmonary emboli versus painful splenomegaly 5. Indeterminate solid lesions in the left kidney on CT 12/05/2014-evaluated by urology 6. History of Thrombocytopenia-mild, likely related to hydroxyurea.  7. Rectal bleeding-most likely secondary to hemorrhoids. She has been evaluated by gastroenterology.   Disposition:[name] remains stable from a hematologic standpoint. She will continue hydroxyurea weekly. She will return for labs and a follow-up visit in 12 weeks. She will contact the office in the interim with any problems.  Plan reviewed with Dr.  Benay Spice.    Ned Card ANP/GNP-BC   12/27/2015  10:08 AM

## 2015-12-27 NOTE — Telephone Encounter (Signed)
Gave patient/relative avs report and appointments for October  °

## 2016-01-10 DIAGNOSIS — H04223 Epiphora due to insufficient drainage, bilateral lacrimal glands: Secondary | ICD-10-CM | POA: Diagnosis not present

## 2016-01-10 DIAGNOSIS — H01004 Unspecified blepharitis left upper eyelid: Secondary | ICD-10-CM | POA: Diagnosis not present

## 2016-01-10 DIAGNOSIS — H01001 Unspecified blepharitis right upper eyelid: Secondary | ICD-10-CM | POA: Diagnosis not present

## 2016-01-24 ENCOUNTER — Other Ambulatory Visit: Payer: Self-pay | Admitting: Oncology

## 2016-01-29 DIAGNOSIS — G3184 Mild cognitive impairment, so stated: Secondary | ICD-10-CM | POA: Diagnosis not present

## 2016-01-29 DIAGNOSIS — I10 Essential (primary) hypertension: Secondary | ICD-10-CM | POA: Diagnosis not present

## 2016-01-29 DIAGNOSIS — Z1389 Encounter for screening for other disorder: Secondary | ICD-10-CM | POA: Diagnosis not present

## 2016-01-29 DIAGNOSIS — R0600 Dyspnea, unspecified: Secondary | ICD-10-CM | POA: Diagnosis not present

## 2016-02-04 ENCOUNTER — Other Ambulatory Visit: Payer: Self-pay | Admitting: Nurse Practitioner

## 2016-02-06 DIAGNOSIS — K644 Residual hemorrhoidal skin tags: Secondary | ICD-10-CM | POA: Diagnosis not present

## 2016-02-07 ENCOUNTER — Ambulatory Visit: Payer: Medicare Other | Admitting: *Deleted

## 2016-02-07 ENCOUNTER — Telehealth: Payer: Self-pay | Admitting: Cardiology

## 2016-02-07 DIAGNOSIS — H04222 Epiphora due to insufficient drainage, left lacrimal gland: Secondary | ICD-10-CM | POA: Diagnosis not present

## 2016-02-07 DIAGNOSIS — J3481 Nasal mucositis (ulcerative): Secondary | ICD-10-CM | POA: Diagnosis not present

## 2016-02-07 DIAGNOSIS — H02135 Senile ectropion of left lower eyelid: Secondary | ICD-10-CM | POA: Diagnosis not present

## 2016-02-07 DIAGNOSIS — H04221 Epiphora due to insufficient drainage, right lacrimal gland: Secondary | ICD-10-CM | POA: Diagnosis not present

## 2016-02-07 DIAGNOSIS — H04551 Acquired stenosis of right nasolacrimal duct: Secondary | ICD-10-CM | POA: Diagnosis not present

## 2016-02-07 NOTE — Telephone Encounter (Signed)
Spoke with pt and reminded pt of remote transmission that is due today. Pt verbalized understanding.   

## 2016-02-08 ENCOUNTER — Encounter: Payer: Self-pay | Admitting: Cardiology

## 2016-02-11 ENCOUNTER — Ambulatory Visit (INDEPENDENT_AMBULATORY_CARE_PROVIDER_SITE_OTHER): Payer: Medicare Other | Admitting: *Deleted

## 2016-02-11 DIAGNOSIS — I442 Atrioventricular block, complete: Secondary | ICD-10-CM

## 2016-02-11 DIAGNOSIS — I441 Atrioventricular block, second degree: Secondary | ICD-10-CM

## 2016-02-12 NOTE — Progress Notes (Signed)
Remote pacemaker transmission.   

## 2016-02-13 LAB — CUP PACEART REMOTE DEVICE CHECK
Brady Statistic AP VS Percent: 0 %
Brady Statistic AS VS Percent: 0 %
Implantable Lead Implant Date: 20101013
Implantable Lead Location: 753860
Implantable Lead Model: 4469
Implantable Lead Serial Number: 329051
Lead Channel Impedance Value: 393 Ohm
Lead Channel Impedance Value: 403 Ohm
Lead Channel Setting Pacing Amplitude: 2.5 V
MDC IDC LEAD IMPLANT DT: 20030616
MDC IDC LEAD LOCATION: 753859
MDC IDC MSMT BATTERY IMPEDANCE: 1694 Ohm
MDC IDC MSMT BATTERY REMAINING LONGEVITY: 31 mo
MDC IDC MSMT BATTERY VOLTAGE: 2.76 V
MDC IDC MSMT LEADCHNL RA SENSING INTR AMPL: 2.8 mV
MDC IDC SESS DTM: 20170826170540
MDC IDC SET LEADCHNL RA PACING AMPLITUDE: 2 V
MDC IDC SET LEADCHNL RV PACING PULSEWIDTH: 0.4 ms
MDC IDC SET LEADCHNL RV SENSING SENSITIVITY: 5.6 mV
MDC IDC STAT BRADY AP VP PERCENT: 43 %
MDC IDC STAT BRADY AS VP PERCENT: 57 %

## 2016-02-14 ENCOUNTER — Encounter: Payer: Self-pay | Admitting: Cardiology

## 2016-03-10 ENCOUNTER — Encounter: Payer: Medicare Other | Admitting: Internal Medicine

## 2016-03-20 ENCOUNTER — Telehealth: Payer: Self-pay | Admitting: Oncology

## 2016-03-20 ENCOUNTER — Ambulatory Visit (HOSPITAL_BASED_OUTPATIENT_CLINIC_OR_DEPARTMENT_OTHER): Payer: Medicare Other | Admitting: Oncology

## 2016-03-20 ENCOUNTER — Other Ambulatory Visit (HOSPITAL_BASED_OUTPATIENT_CLINIC_OR_DEPARTMENT_OTHER): Payer: Medicare Other

## 2016-03-20 VITALS — BP 154/56 | HR 81 | Temp 97.8°F | Resp 17 | Ht 62.0 in | Wt 113.9 lb

## 2016-03-20 DIAGNOSIS — D45 Polycythemia vera: Secondary | ICD-10-CM

## 2016-03-20 DIAGNOSIS — Z86711 Personal history of pulmonary embolism: Secondary | ICD-10-CM

## 2016-03-20 LAB — CBC WITH DIFFERENTIAL/PLATELET
BASO%: 0.4 % (ref 0.0–2.0)
BASOS ABS: 0.2 10*3/uL — AB (ref 0.0–0.1)
EOS ABS: 1.6 10*3/uL — AB (ref 0.0–0.5)
EOS%: 3.7 % (ref 0.0–7.0)
HCT: 43 % (ref 34.8–46.6)
HGB: 12.6 g/dL (ref 11.6–15.9)
LYMPH%: 4.5 % — AB (ref 14.0–49.7)
MCH: 18.1 pg — ABNORMAL LOW (ref 25.1–34.0)
MCHC: 29.3 g/dL — AB (ref 31.5–36.0)
MCV: 61.9 fL — AB (ref 79.5–101.0)
MONO#: 1.9 10*3/uL — ABNORMAL HIGH (ref 0.1–0.9)
MONO%: 4.4 % (ref 0.0–14.0)
NEUT#: 37.5 10*3/uL — ABNORMAL HIGH (ref 1.5–6.5)
NEUT%: 87 % — ABNORMAL HIGH (ref 38.4–76.8)
Platelets: 300 10*3/uL (ref 145–400)
RBC: 6.95 10*6/uL — AB (ref 3.70–5.45)
RDW: 27.4 % — ABNORMAL HIGH (ref 11.2–14.5)
WBC: 43.1 10*3/uL — AB (ref 3.9–10.3)
lymph#: 1.9 10*3/uL (ref 0.9–3.3)

## 2016-03-20 LAB — TECHNOLOGIST REVIEW

## 2016-03-20 NOTE — Progress Notes (Signed)
  [facility] OFFICE PROGRESS NOTE   Diagnosis: Polycythemia vera  INTERVAL HISTORY:   [name] returns as scheduled. She continues hydroxyurea 1 day per week. No bleeding or symptom of thrombosis. Her daughter reports [name] has undergone a sigmoidoscopy within the past several months.  Objective:  Vital signs in last 24 hours:  Blood pressure (!) 154/56, pulse 81, temperature 97.8 F (36.6 C), temperature source Oral, resp. rate 17, height 5\' 2"  (1.575 m), weight 113 lb 14.4 oz (51.7 kg), SpO2 99 %.    HEENT: No thrush or ulcers Resp: Lungs clear bilaterally Cardio: Regular rate and rhythm GI: No hepatomegaly, the spleen is palpable in the left mid abdomen. There is tenderness and firm fullness in the deep left lower abdomen Vascular: No leg edema   Lab Results:  Lab Results  Component Value Date   WBC 43.1 (H) 03/20/2016   HGB 12.6 03/20/2016   HCT 43.0 03/20/2016   MCV 61.9 (L) 03/20/2016   PLT 300 03/20/2016   NEUTROABS 37.5 (H) 03/20/2016     Medications: I have reviewed the patient's current medications.  Assessment/Plan: 1. Polycythemia vera-JAK-2 positive. 2. History of Iron deficiency secondary to phlebotomy. 3. Bilateral pulmonary embolism 11/27/2014-Xarelto on hold beginning 06/01/2015 secondary to rectal bleeding 4. Left lower chest/upper abdominal pain-likely related to pulmonary emboli versus painful splenomegaly 5. Indeterminate solid lesions in the left kidney on CT 12/05/2014-evaluated by urology 6. History of Thrombocytopenia-mild, likely related to hydroxyurea.  7. Rectal bleeding-most likely secondary to hemorrhoids. She has been evaluated by gastroenterology.    Disposition:  [name] is stable from a hematologic standpoint. She will continue hydroxyurea. She will return for an office visit and CBC in 3 months.  Betsy Coder, MD  03/20/2016  10:43 AM

## 2016-03-20 NOTE — Telephone Encounter (Signed)
Gave patient avs report and appointments for january

## 2016-03-27 ENCOUNTER — Encounter: Payer: Self-pay | Admitting: Internal Medicine

## 2016-03-27 ENCOUNTER — Ambulatory Visit (INDEPENDENT_AMBULATORY_CARE_PROVIDER_SITE_OTHER): Payer: Medicare Other | Admitting: Internal Medicine

## 2016-03-27 VITALS — BP 150/58 | HR 76 | Ht 62.0 in | Wt 114.2 lb

## 2016-03-27 DIAGNOSIS — I10 Essential (primary) hypertension: Secondary | ICD-10-CM | POA: Diagnosis not present

## 2016-03-27 DIAGNOSIS — Z950 Presence of cardiac pacemaker: Secondary | ICD-10-CM | POA: Diagnosis not present

## 2016-03-27 DIAGNOSIS — I442 Atrioventricular block, complete: Secondary | ICD-10-CM | POA: Diagnosis not present

## 2016-03-27 DIAGNOSIS — Z23 Encounter for immunization: Secondary | ICD-10-CM | POA: Diagnosis not present

## 2016-03-27 DIAGNOSIS — R0602 Shortness of breath: Secondary | ICD-10-CM | POA: Diagnosis not present

## 2016-03-27 LAB — CUP PACEART INCLINIC DEVICE CHECK
Battery Remaining Longevity: 30 mo
Battery Voltage: 2.75 V
Brady Statistic AP VP Percent: 41 %
Brady Statistic AS VP Percent: 59 %
Date Time Interrogation Session: 20171012094741
Implantable Lead Implant Date: 20101013
Implantable Lead Location: 753860
Implantable Lead Model: 4469
Implantable Lead Model: 5076
Lead Channel Pacing Threshold Amplitude: 1 V
Lead Channel Sensing Intrinsic Amplitude: 5.6 mV
Lead Channel Setting Pacing Amplitude: 2.5 V
Lead Channel Setting Pacing Pulse Width: 0.4 ms
MDC IDC LEAD IMPLANT DT: 20030616
MDC IDC LEAD LOCATION: 753859
MDC IDC LEAD SERIAL: 329051
MDC IDC MSMT BATTERY IMPEDANCE: 1759 Ohm
MDC IDC MSMT LEADCHNL RA IMPEDANCE VALUE: 409 Ohm
MDC IDC MSMT LEADCHNL RA PACING THRESHOLD PULSEWIDTH: 0.4 ms
MDC IDC MSMT LEADCHNL RV IMPEDANCE VALUE: 381 Ohm
MDC IDC MSMT LEADCHNL RV PACING THRESHOLD AMPLITUDE: 1 V
MDC IDC MSMT LEADCHNL RV PACING THRESHOLD PULSEWIDTH: 0.4 ms
MDC IDC SET LEADCHNL RA PACING AMPLITUDE: 2 V
MDC IDC SET LEADCHNL RV SENSING SENSITIVITY: 5.6 mV
MDC IDC STAT BRADY AP VS PERCENT: 0 %
MDC IDC STAT BRADY AS VS PERCENT: 0 %

## 2016-03-27 NOTE — Progress Notes (Signed)
Electrophysiology Office Note   Date:  03/27/2016   ID:  [name], DOB [birth year], MRN [id number]  PCP:  Vidal Schwalbe, MD  Primary Electrophysiologist: Thompson Grayer, MD    Chief Complaint  Patient presents with  . Follow-up    Mobitz type II AV block     History of Present Illness: [name] is a 82 y.o. female who presents today for electrophysiology evaluation.  Since I saw her last, she had had significant decline (epic records reviewed).  She has had extensive PTE for which she has since been anticoagulated but remains quite SOB. Today, she denies symptoms of palpitations, exertional chest pain,  orthopnea, PND, lower extremity edema, claudication, dizziness, presyncope, syncope, bleeding, or neurologic sequela. The patient is tolerating medications without difficulties and is otherwise without complaint today.    Past Medical History:  Diagnosis Date  . Balance problem   . Complete heart block ([place])   . Dyspnea   . GI bleed   . Gout   . Hypercalcemia   . Hypercholesterolemia   . Hypertension   . Left bundle branch block   . Leukocytosis   . Osteopenia   . Polycythemia   . Polycythemia vera(238.4)   . Pulmonary emboli ([place])   . Stroke ([place])   . Syncope   . Thyroid nodule    Past Surgical History:  Procedure Laterality Date  . APPENDECTOMY    . ESOPHAGOGASTRODUODENOSCOPY N/A 11/30/2014   Procedure: ESOPHAGOGASTRODUODENOSCOPY (EGD);  Surgeon: Laurence Spates, MD;  Location: [facility] ENDOSCOPY;  Service: Endoscopy;  Laterality: N/A;  . HAND SURGERY     S/P Carpal tunnel repair  . PACEMAKER INSERTION  03/26/09   MDT Adapta DR, revised for RV lead fracture by Dr Doreatha Lew  . TRANSTHORACIC ECHOCARDIOGRAM  03/26/2009  . US ECHOCARDIOGRAPHY  03/01/2009   EF 55-60%     Current Outpatient Prescriptions  Medication Sig Dispense Refill  . amLODipine (NORVASC) 5 MG tablet Take 5 mg by mouth daily.      . beta carotene w/minerals (OCUVITE) tablet Take 1  tablet by mouth daily.      . calcium-vitamin D (OSCAL WITH D) 500-200 MG-UNIT per tablet Take 1 tablet by mouth daily.     Marland Kitchen CALCIUM-VITAMIN D PO Take 2 tablets by mouth daily.    Marland Kitchen donepezil (ARICEPT) 5 MG tablet Take 10 mg by mouth at bedtime.   2  . fluticasone (FLONASE) 50 MCG/ACT nasal spray USE 2 SPRAYS INTO EACH NOSTRIL EVERY DAY  12  . hydrocortisone (ANUSOL-HC) 25 MG suppository every 14 (fourteen) days. As directed  0  . hydroxyurea (HYDREA) 500 MG capsule TAKE ONE CAPSULE BY MOUTH ONCE A WEEK ON THURSDAYS 12 capsule 0  . loratadine (CLARITIN) 10 MG tablet Take 10 mg by mouth daily.    Marland Kitchen omeprazole (PRILOSEC) 40 MG capsule Take 40 mg by mouth daily.    Vladimir Faster Glycol-Propyl Glycol (SYSTANE ULTRA OP) Apply 1 drop to eye 2 (two) times daily.     Marland Kitchen pyridOXINE (VITAMIN B-6) 100 MG tablet Take 100 mg by mouth daily.    . simvastatin (ZOCOR) 10 MG tablet Take 10 mg by mouth at bedtime.     . Wheat Dextrin (BENEFIBER DRINK MIX PO) Take by mouth daily.      No current facility-administered medications for this visit.     Allergies:   Chocolate; Xarelto [rivaroxaban]; and Pollen extract   Social History:  The patient  reports that she has never  smoked. She has never used smokeless tobacco. She reports that she does not drink alcohol or use drugs.   Family History:  The patient's family history includes Asthma in her daughter; Canavan disease in her son; Deep vein thrombosis in her daughter; Depression in her daughter; Heart disease in her son; Migraines in her daughter and son; Other in her daughter; Pulmonary embolism in her daughter; Stroke (age of onset: 22) in her son. She was adopted.    ROS:  Please see the history of present illness.   All other systems are reviewed and negative.    PHYSICAL EXAM: VS:  BP (!) 150/58   Pulse 76   Ht 5\' 2"  (1.575 m)   Wt 114 lb 3.2 oz (51.8 kg)   BMI 20.89 kg/m  , BMI Body mass index is 20.89 kg/m. GEN: Well nourished, well developed, in  no acute distress HEENT: normal Neck: no JVD, carotid bruits, or masses Cardiac: RRR; no murmurs, rubs, or gallops,no edema  Respiratory:  clear to auscultation bilaterally, normal work of breathing GI: soft, nontender, nondistended, + BS MS: no deformity or atrophy, R knee looks fine Skin: warm and dry, device pocket is well healed Neuro:  Strength and sensation are intact Psych: euthymic mood, full affect  Device interrogation is reviewed today in detail.  See PaceArt for details.   Recent Labs: 03/20/2016: HGB 12.6; Platelets 300    Lipid Panel     Component Value Date/Time   CHOL 78 12/06/2014 0500   TRIG 118 12/06/2014 0500   HDL 17 (L) 12/06/2014 0500   CHOLHDL 4.6 12/06/2014 0500   VLDL 24 12/06/2014 0500   LDLCALC 37 12/06/2014 0500     Wt Readings from Last 3 Encounters:  03/27/16 114 lb 3.2 oz (51.8 kg)  03/20/16 113 lb 14.4 oz (51.7 kg)  12/27/15 114 lb 6.4 oz (51.9 kg)    ASSESSMENT AND PLAN:  1.  Complete heart block Normal pacemaker function See Pace Art report No changes today  2. HTN Stable No change required today  3. Prior stroke Currently on xarelto for recent PTE  4. PTE On xarelto sats today on room air and with ambulation 97%  Current medicines are reviewed at length with the patient today.   The patient does not have concerns regarding her medicines.  The following changes were made today:  none  Follow-up: [facility], return to see Truitt Merle in 6 months I will see in a year  Signed, Thompson Grayer, MD  03/27/2016 4:02 PM     [place] [address] [phone] (office) [phone] (fax)

## 2016-03-27 NOTE — Patient Instructions (Addendum)
Medication Instructions:  Your physician recommends that you continue on your current medications as directed. Please refer to the Current Medication list given to you today.   Labwork: None ordered   Testing/Procedures: None ordered   Follow-Up: Your physician wants you to follow-up in: 6 months with Chanetta Marshall, NP You will receive a reminder letter in the mail two months in advance. If you don't receive a letter, please call our office to schedule the follow-up appointment.  Will do remote next week on 04/03/16  Remote monitoring is used to monitor your Pacemaker from home. This monitoring reduces the number of office visits required to check your device to one time per year. It allows [place] to keep an eye on the functioning of your device to ensure it is working properly. You are scheduled for a device check from home on 06/25/16. You may send your transmission at any time that day. If you have a wireless device, the transmission will be sent automatically. After your physician reviews your transmission, you will receive a postcard with your next transmission date.     Any Other Special Instructions Will Be Listed Below (If Applicable).     If you need a refill on your cardiac medications before your next appointment, please call your pharmacy.

## 2016-04-09 ENCOUNTER — Other Ambulatory Visit: Payer: Self-pay | Admitting: Nurse Practitioner

## 2016-04-10 ENCOUNTER — Other Ambulatory Visit: Payer: Self-pay | Admitting: *Deleted

## 2016-04-10 MED ORDER — HYDROXYUREA 500 MG PO CAPS: ORAL_CAPSULE | ORAL | 0 refills | Status: DC

## 2016-05-01 DIAGNOSIS — R413 Other amnesia: Secondary | ICD-10-CM | POA: Diagnosis not present

## 2016-05-01 DIAGNOSIS — I10 Essential (primary) hypertension: Secondary | ICD-10-CM | POA: Diagnosis not present

## 2016-05-01 DIAGNOSIS — R0989 Other specified symptoms and signs involving the circulatory and respiratory systems: Secondary | ICD-10-CM | POA: Diagnosis not present

## 2016-05-26 ENCOUNTER — Other Ambulatory Visit: Payer: Self-pay | Admitting: Ophthalmology

## 2016-05-26 DIAGNOSIS — H04552 Acquired stenosis of left nasolacrimal duct: Secondary | ICD-10-CM | POA: Diagnosis not present

## 2016-05-26 DIAGNOSIS — H04412 Chronic dacryocystitis of left lacrimal passage: Secondary | ICD-10-CM | POA: Diagnosis not present

## 2016-05-26 DIAGNOSIS — H02135 Senile ectropion of left lower eyelid: Secondary | ICD-10-CM | POA: Diagnosis not present

## 2016-05-26 DIAGNOSIS — H04222 Epiphora due to insufficient drainage, left lacrimal gland: Secondary | ICD-10-CM | POA: Diagnosis not present

## 2016-05-30 ENCOUNTER — Telehealth: Payer: Self-pay | Admitting: Internal Medicine

## 2016-05-30 NOTE — Telephone Encounter (Signed)
Daughter calling stating her Mom has been SOB since Mon.  On Mon she had to have surgery on a tear duct and a tube was placed in nose.  Spoke w/Ms. Hartline who gave permission to speak with daughter.  Ms. Lasher states she is OK and is able to breath thru one side of her nose.  Does not appear SOB while speaking with her.  Ms. Hampe told her daughter not to call us.  Advised daughter to have nurse at [facility] evaluate her and take her BP.  Also suggested to her that she may want to call the doctor who did procedure to see how long she would have to have the tubes.  Daughter is agreeable and will have nurse evaluate her and if necessary can call us back.

## 2016-05-30 NOTE — Telephone Encounter (Signed)
Pt c/o Shortness Of Breath: STAT if SOB developed within the last 24 hours or pt is noticeably SOB on the phone ° °1. Are you currently SOB (can you hear that pt is SOB on the phone)? no ° °2. How long have you been experiencing SOB? 05/29/16 °3. Are you SOB when sitting or when up moving around? both °4. Are you currently experiencing any other symptoms?  no ° °

## 2016-06-12 DIAGNOSIS — J209 Acute bronchitis, unspecified: Secondary | ICD-10-CM | POA: Diagnosis not present

## 2016-06-19 ENCOUNTER — Telehealth: Payer: Self-pay | Admitting: Nurse Practitioner

## 2016-06-19 ENCOUNTER — Ambulatory Visit: Payer: Medicare Other | Admitting: Nurse Practitioner

## 2016-06-19 ENCOUNTER — Other Ambulatory Visit: Payer: Medicare Other

## 2016-06-19 NOTE — Telephone Encounter (Signed)
Daughter called to reschedule appointment per the patient is ill.

## 2016-06-24 ENCOUNTER — Telehealth: Payer: Self-pay | Admitting: Oncology

## 2016-06-24 ENCOUNTER — Ambulatory Visit (HOSPITAL_BASED_OUTPATIENT_CLINIC_OR_DEPARTMENT_OTHER): Payer: Medicare Other | Admitting: Nurse Practitioner

## 2016-06-24 ENCOUNTER — Other Ambulatory Visit (HOSPITAL_BASED_OUTPATIENT_CLINIC_OR_DEPARTMENT_OTHER): Payer: Medicare Other

## 2016-06-24 VITALS — BP 158/53 | HR 78 | Temp 97.8°F | Resp 16 | Wt 111.7 lb

## 2016-06-24 DIAGNOSIS — D696 Thrombocytopenia, unspecified: Secondary | ICD-10-CM

## 2016-06-24 DIAGNOSIS — K625 Hemorrhage of anus and rectum: Secondary | ICD-10-CM

## 2016-06-24 DIAGNOSIS — D45 Polycythemia vera: Secondary | ICD-10-CM | POA: Diagnosis not present

## 2016-06-24 DIAGNOSIS — Z86711 Personal history of pulmonary embolism: Secondary | ICD-10-CM | POA: Diagnosis not present

## 2016-06-24 LAB — CBC WITH DIFFERENTIAL/PLATELET
BASO%: 0.7 % (ref 0.0–2.0)
Basophils Absolute: 0.2 10*3/uL — ABNORMAL HIGH (ref 0.0–0.1)
EOS%: 3.1 % (ref 0.0–7.0)
Eosinophils Absolute: 1 10*3/uL — ABNORMAL HIGH (ref 0.0–0.5)
HCT: 40.6 % (ref 34.8–46.6)
HEMOGLOBIN: 12.2 g/dL (ref 11.6–15.9)
LYMPH#: 1.6 10*3/uL (ref 0.9–3.3)
LYMPH%: 5.1 % — ABNORMAL LOW (ref 14.0–49.7)
MCH: 19.2 pg — ABNORMAL LOW (ref 25.1–34.0)
MCHC: 30 g/dL — ABNORMAL LOW (ref 31.5–36.0)
MCV: 64 fL — ABNORMAL LOW (ref 79.5–101.0)
MONO#: 1.5 10*3/uL — ABNORMAL HIGH (ref 0.1–0.9)
MONO%: 4.9 % (ref 0.0–14.0)
NEUT%: 86.2 % — AB (ref 38.4–76.8)
NEUTROS ABS: 27.2 10*3/uL — AB (ref 1.5–6.5)
NRBC: 1 % — AB (ref 0–0)
Platelets: 251 10*3/uL (ref 145–400)
RBC: 6.34 10*6/uL — ABNORMAL HIGH (ref 3.70–5.45)
RDW: 26.3 % — AB (ref 11.2–14.5)
WBC: 31.5 10*3/uL — AB (ref 3.9–10.3)

## 2016-06-24 LAB — TECHNOLOGIST REVIEW

## 2016-06-24 MED ORDER — HYDROXYUREA 500 MG PO CAPS: ORAL_CAPSULE | ORAL | 0 refills | Status: DC

## 2016-06-24 NOTE — Telephone Encounter (Signed)
Gave relative avs report and appointments for April. °

## 2016-06-24 NOTE — Progress Notes (Signed)
  [facility] [facility] OFFICE PROGRESS NOTE   Diagnosis:  Polycythemia vera  INTERVAL HISTORY:   [name] returns as scheduled. She continues Hydrea. No symptoms of thrombosis. She had left tear duct surgery about 5 weeks ago. Since then she has noted some swelling of the left eye, "blood clots" in her nose and decreased hearing on the left. She continues to have dyspnea on exertion. She reports recent bronchitis.  Objective:  Vital signs in last 24 hours:  Blood pressure (!) 158/53, pulse 78, temperature 97.8 F (36.6 C), resp. rate 16, weight 111 lb 11.2 oz (50.7 kg), SpO2 99 %.    HEENT: No thrush or ulcers. Cerumen present in the left ear canal. Unable to visualize the tympanic membrane. Dried blood in both nasal passages. Resp: Lungs clear bilaterally. Cardio: Regular rate and rhythm. GI: Abdomen is soft. Spleen palpable 3-4 fingerbreadths below the left costal margin. No hepatomegaly. Vascular: No leg edema.   Lab Results:  Lab Results  Component Value Date   WBC 31.5 (H) 06/24/2016   HGB 12.2 06/24/2016   HCT 40.6 06/24/2016   MCV 64.0 (L) 06/24/2016   PLT 251 Large platelets present 06/24/2016   NEUTROABS 27.2 (H) 06/24/2016    Imaging:  No results found.  Medications: I have reviewed the patient's current medications.  Assessment/Plan: 1. Polycythemia vera-JAK-2 positive. 2. History of Iron deficiency secondary to phlebotomy. 3. Bilateral pulmonary embolism 11/27/2014-Xarelto on hold beginning 06/01/2015 secondary to rectal bleeding 4. Left lower chest/upper abdominal pain-likely related to pulmonary emboli versus painful splenomegaly 5. Indeterminate solid lesions in the left kidney on CT 12/05/2014-evaluated by urology 6. History of Thrombocytopenia-mild, likely related to hydroxyurea.  7. Rectal bleeding-most likely secondary to hemorrhoids. She has been evaluated by gastroenterology.   Disposition:[name] remains stable from a  hematologic standpoint. She will continue hydroxyurea. She will return for labs and a follow-up visit in 3 months.  Plan reviewed with Dr. Benay Spice.    Ned Card ANP/GNP-BC   06/24/2016  3:30 PM

## 2016-06-25 ENCOUNTER — Emergency Department (HOSPITAL_COMMUNITY)
Admission: EM | Admit: 2016-06-25 | Discharge: 2016-06-25 | Disposition: A | Discharge status: Home / Self Care | Payer: Medicare Other | Attending: Emergency Medicine | Admitting: Emergency Medicine

## 2016-06-25 ENCOUNTER — Emergency Department (HOSPITAL_COMMUNITY): Payer: Medicare Other

## 2016-06-25 ENCOUNTER — Encounter (HOSPITAL_COMMUNITY): Payer: Self-pay | Admitting: *Deleted

## 2016-06-25 DIAGNOSIS — Z8673 Personal history of transient ischemic attack (TIA), and cerebral infarction without residual deficits: Secondary | ICD-10-CM | POA: Diagnosis not present

## 2016-06-25 DIAGNOSIS — W19XXXA Unspecified fall, initial encounter: Secondary | ICD-10-CM | POA: Insufficient documentation

## 2016-06-25 DIAGNOSIS — Y929 Unspecified place or not applicable: Secondary | ICD-10-CM | POA: Insufficient documentation

## 2016-06-25 DIAGNOSIS — I10 Essential (primary) hypertension: Secondary | ICD-10-CM | POA: Diagnosis not present

## 2016-06-25 DIAGNOSIS — R0789 Other chest pain: Secondary | ICD-10-CM | POA: Diagnosis not present

## 2016-06-25 DIAGNOSIS — J90 Pleural effusion, not elsewhere classified: Secondary | ICD-10-CM | POA: Diagnosis not present

## 2016-06-25 DIAGNOSIS — Y999 Unspecified external cause status: Secondary | ICD-10-CM | POA: Diagnosis not present

## 2016-06-25 DIAGNOSIS — Y939 Activity, unspecified: Secondary | ICD-10-CM | POA: Diagnosis not present

## 2016-06-25 DIAGNOSIS — R0602 Shortness of breath: Secondary | ICD-10-CM | POA: Diagnosis present

## 2016-06-25 DIAGNOSIS — Z950 Presence of cardiac pacemaker: Secondary | ICD-10-CM | POA: Insufficient documentation

## 2016-06-25 LAB — BASIC METABOLIC PANEL
Anion gap: 10 (ref 5–15)
BUN: 18 mg/dL (ref 6–20)
CO2: 23 mmol/L (ref 22–32)
Calcium: 9.5 mg/dL (ref 8.9–10.3)
Chloride: 109 mmol/L (ref 101–111)
Creatinine, Ser: 0.69 mg/dL (ref 0.44–1.00)
GFR calc Af Amer: >60 mL/min (ref >60)
GFR calc non Af Amer: >60 mL/min (ref >60)
Glucose, Bld: 93 mg/dL (ref 65–99)
Potassium: 4.3 mmol/L (ref 3.5–5.1)
Sodium: 142 mmol/L (ref 135–145)

## 2016-06-25 LAB — CBC
HCT: 44.1 % (ref 36.0–46.0)
Hemoglobin: 12.9 g/dL (ref 12.0–15.0)
MCH: 19.1 pg — ABNORMAL LOW (ref 26.0–34.0)
MCHC: 29.3 g/dL — ABNORMAL LOW (ref 30.0–36.0)
MCV: 65.1 fL — ABNORMAL LOW (ref 78.0–100.0)
Platelets: 230 10*3/uL (ref 150–400)
RBC: 6.77 MIL/uL — ABNORMAL HIGH (ref 3.87–5.11)
RDW: 26.4 % — ABNORMAL HIGH (ref 11.5–15.5)
WBC: 34.5 10*3/uL — ABNORMAL HIGH (ref 4.0–10.5)

## 2016-06-25 LAB — I-STAT TROPONIN, ED: Troponin i, poc: 0 ng/mL (ref 0.00–0.08)

## 2016-06-25 MED ORDER — ACETAMINOPHEN 325 MG PO TABS
650.0000 mg | ORAL_TABLET | Freq: Once | ORAL | Status: AC
Start: 2016-06-25 — End: 2016-06-25
  Administered 2016-06-25: 650 mg via ORAL
  Filled 2016-06-25: qty 2

## 2016-06-25 NOTE — ED Triage Notes (Signed)
Pt reports falling last night but has no memory of tripping and falling, possibly had syncopal episode. Pain is to bilateral rib/chest area and specifically near pacemaker site.

## 2016-06-25 NOTE — ED Provider Notes (Signed)
Medtronic interogated and no dysrhythmic events.  Pt d/ced home with PCP f/u.   Blanchie Dessert, MD 06/25/16 [phone]

## 2016-06-25 NOTE — ED Provider Notes (Signed)
[facility] DEPT Provider Note    By signing my name below, I, Bea Graff, attest that this documentation has been prepared under the direction and in the presence of Virgel Manifold, MD. Electronically Signed: Bea Graff, ED Scribe. 06/25/16. 3:03 PM.    History   Chief Complaint Chief Complaint  Patient presents with  . Fall  . Shortness of Breath    The history is provided by the patient and medical records. No language interpreter was used.    [name] is a 83 y.o. female who presents to the Emergency Department complaining of a fall that occurred last night. She does not remember falling or how long she was down but remembers waking up and getting herself to her bed. Her son reports there is nothing in her room for her to trip over and is concerned as to why she fell. She reports associated aching anterior upper left sided CP around her pacemaker that began after the fall which is what prompted her visit today. She also reports some mid back pain and aching lower left sided rib pain. She reports some SOB for the past few weeks. Pt reports having bronchitis last week and just finished an unknown medication about five days ago. She has not taken anything for pain relief. Pt denies modifying factors. She denies abdominal pain, nausea, vomiting. PCP is Dr. Dema Severin. Cardiologist is Dr. Rayann Heman. She lives in an assisted living facility. She is also followed by hematology.    Past Medical History:  Diagnosis Date  . Balance problem   . Complete heart block ([place])   . Dyspnea   . GI bleed   . Gout   . Hypercalcemia   . Hypercholesterolemia   . Hypertension   . Left bundle branch block   . Leukocytosis   . Osteopenia   . Polycythemia   . Polycythemia vera(238.4)   . Pulmonary emboli ([place])   . Stroke ([place])   . Syncope   . Thyroid nodule     Patient Active Problem List   Diagnosis Date Noted  . Paroxysmal dyspnea 09/10/2015  . History of pulmonary embolism  09/10/2015  . Dyspnea 12/05/2014  . Balance problems 12/05/2014  . Left flank pain 12/05/2014  . Balance problem 12/05/2014  . GI bleed 11/29/2014  . Acute pulmonary embolus ([place]) 11/27/2014  . Polycythemia rubra vera ([place]) 11/27/2014  . Leukocytosis 11/27/2014  . Chest pain   . Essential hypertension 09/18/2014  . Mobitz type II atrioventricular block 06/30/2011  . Syncope 06/30/2011  . Polycythemia 05/21/2011  . HYPERTENSION, UNSPECIFIED 07/12/2010  . DYSPNEA 07/12/2010  . PACEMAKER 07/12/2010    Past Surgical History:  Procedure Laterality Date  . APPENDECTOMY    . ESOPHAGOGASTRODUODENOSCOPY N/A 11/30/2014   Procedure: ESOPHAGOGASTRODUODENOSCOPY (EGD);  Surgeon: Laurence Spates, MD;  Location: [facility] ENDOSCOPY;  Service: Endoscopy;  Laterality: N/A;  . HAND SURGERY     S/P Carpal tunnel repair  . PACEMAKER INSERTION  03/26/09   MDT Adapta DR, revised for RV lead fracture by Dr Doreatha Lew  . TRANSTHORACIC ECHOCARDIOGRAM  03/26/2009  . US ECHOCARDIOGRAPHY  03/01/2009   EF 55-60%    OB History    No data available       Home Medications    Prior to Admission medications   Medication Sig Start Date End Date Taking? Authorizing Provider  amLODipine (NORVASC) 5 MG tablet Take 5 mg by mouth daily.      Historical Provider, MD  beta carotene w/minerals (OCUVITE) tablet Take  1 tablet by mouth daily.      Historical Provider, MD  calcium-vitamin D (OSCAL WITH D) 500-200 MG-UNIT per tablet Take 1 tablet by mouth daily.     Historical Provider, MD  CALCIUM-VITAMIN D PO Take 2 tablets by mouth daily.    Historical Provider, MD  donepezil (ARICEPT) 5 MG tablet Take 10 mg by mouth at bedtime.  06/13/15   Historical Provider, MD  fluticasone (FLONASE) 50 MCG/ACT nasal spray USE 2 SPRAYS INTO EACH NOSTRIL EVERY DAY 07/05/15   Historical Provider, MD  hydrocortisone (ANUSOL-HC) 25 MG suppository every 14 (fourteen) days. As directed 08/10/15   Historical Provider, MD  hydroxyurea (HYDREA) 500  MG capsule TAKE ONE CAPSULE BY MOUTH ONCE A WEEK ON THURSDAYS 06/24/16   Owens Shark, NP  loratadine (CLARITIN) 10 MG tablet Take 10 mg by mouth daily.    Historical Provider, MD  omeprazole (PRILOSEC) 40 MG capsule Take 40 mg by mouth daily. 03/15/15   Historical Provider, MD  Polyethyl Glycol-Propyl Glycol (SYSTANE ULTRA OP) Apply 1 drop to eye 2 (two) times daily.     Historical Provider, MD  pyridOXINE (VITAMIN B-6) 100 MG tablet Take 100 mg by mouth daily.    Historical Provider, MD  simvastatin (ZOCOR) 10 MG tablet Take 10 mg by mouth at bedtime.     Historical Provider, MD  Wheat Dextrin (BENEFIBER DRINK MIX PO) Take by mouth daily.     Historical Provider, MD    Family History Family History  Problem Relation Age of Onset  . Adopted: Yes  . Migraines Son   . Heart disease Son     CABGx5  . Stroke Son 60  . Canavan disease Son     prostate  . Pulmonary embolism Daughter     x2 (age 78 & 35)  . Deep vein thrombosis Daughter   . Asthma Daughter   . Migraines Daughter   . Depression Daughter   . Other Daughter     Acid reflux    Social History Social History  Substance Use Topics  . Smoking status: Never Smoker  . Smokeless tobacco: Never Used     Comment: never smoked  . Alcohol use No     Allergies   Chocolate; Xarelto [rivaroxaban]; and Pollen extract   Review of Systems Review of Systems A complete 10 system review of systems was obtained and all systems are negative except as noted in the HPI and PMH.    Physical Exam Updated Vital Signs BP (!) 172/51 (BP Location: Right Arm)   Pulse 65   Temp 97.5 F (36.4 C) (Oral)   Resp 15   SpO2 100%   Physical Exam  Constitutional: She is oriented to person, place, and time. She appears well-developed and well-nourished. No distress.  HENT:  Head: Normocephalic and atraumatic.  Eyes: EOM are normal.  Neck: Normal range of motion.  Cardiovascular: Normal rate, regular rhythm and normal heart sounds.     Pulmonary/Chest: Effort normal and breath sounds normal. She exhibits tenderness.  Abdominal: Soft. She exhibits no distension. There is no tenderness.  Musculoskeletal: Normal range of motion. She exhibits tenderness. She exhibits no edema or deformity.  Tender along left costal margin and left thoracic back. No midline spinal tenderness.  Neurological: She is alert and oriented to person, place, and time.  Skin: Skin is warm and dry.  Psychiatric: She has a normal mood and affect. Judgment normal.  Nursing note and vitals reviewed.    ED  Treatments / Results  DIAGNOSTIC STUDIES: Oxygen Saturation is 100% on RA, normal by my interpretation.   COORDINATION OF CARE: 3:00 PM- Offered pain medication but pt declined. Will interrogate pacemaker. Pt verbalizes understanding and agrees to plan.  Medications - No data to display  Labs (all labs ordered are listed, but only abnormal results are displayed) Labs Reviewed  CBC - Abnormal; Notable for the following:       Result Value   WBC 34.5 (*)    RBC 6.77 (*)    MCV 65.1 (*)    MCH 19.1 (*)    MCHC 29.3 (*)    RDW 26.4 (*)    All other components within normal limits  BASIC METABOLIC PANEL  I-STAT TROPOININ, ED    EKG  EKG Interpretation  Date/Time:  Wednesday June 25 2016 11:29:17 EST Ventricular Rate:  76 PR Interval:  214 QRS Duration: 158 QT Interval:  450 QTC Calculation: 506 R Axis:   68 Text Interpretation:  Atrial-sensed ventricular-paced rhythm Abnormal ECG Confirmed by Wilson Singer  MD, Lua Feng [phone]) on 06/25/2016 2:45:12 PM       Radiology Dg Chest 2 View  Result Date: 06/25/2016 CLINICAL DATA:  Syncope. EXAM: CHEST  2 VIEW COMPARISON:  09/10/2015 . FINDINGS: AICD noted stable position. Cardiomegaly with normal pulmonary vascularity. Low lung volumes with mild basilar atelectasis. Cardiomegaly. Small bilateral pleural effusions. No pneumothorax. Old left rib fractures. IMPRESSION: 1. AICD in stable position.  Cardiomegaly. No pulmonary venous congestion. 2.  Small bilateral pleural effusions. Electronically Signed   By: Marcello Moores  Register   On: 06/25/2016 12:14    Procedures Procedures (including critical care time)  Medications Ordered in ED Medications - No data to display   Initial Impression / Assessment and Plan / ED Course  I have reviewed the triage vital signs and the nursing notes.  Pertinent labs & imaging results that were available during my care of the patient were reviewed by me and considered in my medical decision making (see chart for details).  Clinical Course     89yF with fall last night. She is unsure of circumstances. Woke up this morning with CP. Most likely chest wall pain. Worse with certain movement and palpation. CXR with what appears to be old fxs. She is declining pain meds. CBC consistent with recent and she is followed by hematology. EKG is paced. Medtronic device. Will interrogate. If this doesn't show any significant abnormality, I feel she can be appropriately discharged to follow-up with her PCP.   I personally preformed the services scribed in my presence. The recorded information has been reviewed is accurate. Virgel Manifold, MD.   Final Clinical Impressions(s) / ED Diagnoses   Final diagnoses:  Chest wall pain  Fall, initial encounter    New Prescriptions New Prescriptions   No medications on file     Virgel Manifold, MD 06/25/16 [phone]

## 2016-06-25 NOTE — ED Notes (Signed)
Pt and pt son verbalize discharge instructions. Unable to sign d/t computer error.

## 2016-06-26 ENCOUNTER — Telehealth: Payer: Self-pay | Admitting: Cardiology

## 2016-06-26 ENCOUNTER — Encounter: Payer: Medicare Other | Admitting: *Deleted

## 2016-06-26 DIAGNOSIS — H04222 Epiphora due to insufficient drainage, left lacrimal gland: Secondary | ICD-10-CM | POA: Diagnosis not present

## 2016-06-26 DIAGNOSIS — H04223 Epiphora due to insufficient drainage, bilateral lacrimal glands: Secondary | ICD-10-CM | POA: Diagnosis not present

## 2016-06-26 DIAGNOSIS — H04221 Epiphora due to insufficient drainage, right lacrimal gland: Secondary | ICD-10-CM | POA: Diagnosis not present

## 2016-06-26 DIAGNOSIS — J3481 Nasal mucositis (ulcerative): Secondary | ICD-10-CM | POA: Diagnosis not present

## 2016-06-26 DIAGNOSIS — H04552 Acquired stenosis of left nasolacrimal duct: Secondary | ICD-10-CM | POA: Diagnosis not present

## 2016-06-26 NOTE — Telephone Encounter (Signed)
Attempted to confirm remote transmission with pt. No answer and was unable to leave a message.   

## 2016-06-27 ENCOUNTER — Encounter: Payer: Self-pay | Admitting: Cardiology

## 2016-07-01 DIAGNOSIS — H9192 Unspecified hearing loss, left ear: Secondary | ICD-10-CM | POA: Diagnosis not present

## 2016-07-10 ENCOUNTER — Ambulatory Visit (INDEPENDENT_AMBULATORY_CARE_PROVIDER_SITE_OTHER): Payer: Medicare Other | Admitting: *Deleted

## 2016-07-10 DIAGNOSIS — I442 Atrioventricular block, complete: Secondary | ICD-10-CM | POA: Diagnosis not present

## 2016-07-11 NOTE — Progress Notes (Signed)
Remote pacemaker transmission.   

## 2016-07-12 LAB — CUP PACEART REMOTE DEVICE CHECK
Battery Remaining Longevity: 28 mo
Battery Voltage: 2.75 V
Brady Statistic AP VP Percent: 33 %
Brady Statistic AS VP Percent: 67 %
Brady Statistic AS VS Percent: 0 %
Date Time Interrogation Session: 20180125233418
Implantable Lead Location: 753860
Implantable Lead Model: 4469
Implantable Lead Model: 5076
Implantable Pulse Generator Implant Date: 20101011
Lead Channel Impedance Value: 385 Ohm
Lead Channel Setting Pacing Amplitude: 2 V
Lead Channel Setting Pacing Amplitude: 2.5 V
Lead Channel Setting Sensing Sensitivity: 5.6 mV
MDC IDC LEAD IMPLANT DT: 20030616
MDC IDC LEAD IMPLANT DT: 20101013
MDC IDC LEAD LOCATION: 753859
MDC IDC LEAD SERIAL: 329051
MDC IDC MSMT BATTERY IMPEDANCE: 1907 Ohm
MDC IDC MSMT LEADCHNL RV IMPEDANCE VALUE: 373 Ohm
MDC IDC SET LEADCHNL RV PACING PULSEWIDTH: 0.4 ms
MDC IDC STAT BRADY AP VS PERCENT: 0 %

## 2016-07-15 ENCOUNTER — Encounter: Payer: Self-pay | Admitting: Cardiology

## 2016-09-11 DIAGNOSIS — I10 Essential (primary) hypertension: Secondary | ICD-10-CM | POA: Diagnosis not present

## 2016-09-11 DIAGNOSIS — D751 Secondary polycythemia: Secondary | ICD-10-CM | POA: Diagnosis not present

## 2016-09-11 DIAGNOSIS — R634 Abnormal weight loss: Secondary | ICD-10-CM | POA: Diagnosis not present

## 2016-09-11 DIAGNOSIS — D72829 Elevated white blood cell count, unspecified: Secondary | ICD-10-CM | POA: Diagnosis not present

## 2016-09-11 DIAGNOSIS — R413 Other amnesia: Secondary | ICD-10-CM | POA: Diagnosis not present

## 2016-09-20 ENCOUNTER — Other Ambulatory Visit: Payer: Self-pay | Admitting: Nurse Practitioner

## 2016-09-20 DIAGNOSIS — D45 Polycythemia vera: Secondary | ICD-10-CM

## 2016-09-25 ENCOUNTER — Telehealth: Payer: Self-pay | Admitting: Oncology

## 2016-09-25 ENCOUNTER — Ambulatory Visit: Payer: Medicare Other | Admitting: Oncology

## 2016-09-25 ENCOUNTER — Other Ambulatory Visit: Payer: Medicare Other

## 2016-09-25 NOTE — Telephone Encounter (Signed)
Per schd msg from Ashley/operator, Patient's daughter called to reschedule appointments. Appointments was rescheduled to next available date. Called patient to verify appointment, no voice mail available. Appointment letter and schedule mailed.

## 2016-09-25 NOTE — Telephone Encounter (Signed)
Patient's daughter called and canceled patient's appointment due to having a [place] and not being able to drive.  She needs to reschedule the appointment for her mother [name]

## 2016-09-30 ENCOUNTER — Ambulatory Visit: Payer: Medicare Other | Admitting: Oncology

## 2016-09-30 ENCOUNTER — Other Ambulatory Visit: Payer: Medicare Other

## 2016-09-30 DIAGNOSIS — H01021 Squamous blepharitis right upper eyelid: Secondary | ICD-10-CM | POA: Diagnosis not present

## 2016-09-30 DIAGNOSIS — H01024 Squamous blepharitis left upper eyelid: Secondary | ICD-10-CM | POA: Diagnosis not present

## 2016-09-30 DIAGNOSIS — H16142 Punctate keratitis, left eye: Secondary | ICD-10-CM | POA: Diagnosis not present

## 2016-09-30 DIAGNOSIS — H5712 Ocular pain, left eye: Secondary | ICD-10-CM | POA: Diagnosis not present

## 2016-10-07 ENCOUNTER — Telehealth: Payer: Self-pay | Admitting: Nurse Practitioner

## 2016-10-07 ENCOUNTER — Other Ambulatory Visit (HOSPITAL_BASED_OUTPATIENT_CLINIC_OR_DEPARTMENT_OTHER): Payer: Medicare Other

## 2016-10-07 ENCOUNTER — Ambulatory Visit (HOSPITAL_BASED_OUTPATIENT_CLINIC_OR_DEPARTMENT_OTHER): Payer: Medicare Other | Admitting: Nurse Practitioner

## 2016-10-07 VITALS — BP 152/53 | HR 85 | Temp 97.6°F | Resp 16 | Ht 62.0 in | Wt 109.9 lb

## 2016-10-07 DIAGNOSIS — D45 Polycythemia vera: Secondary | ICD-10-CM

## 2016-10-07 DIAGNOSIS — D696 Thrombocytopenia, unspecified: Secondary | ICD-10-CM

## 2016-10-07 DIAGNOSIS — D508 Other iron deficiency anemias: Secondary | ICD-10-CM

## 2016-10-07 DIAGNOSIS — Z86711 Personal history of pulmonary embolism: Secondary | ICD-10-CM

## 2016-10-07 LAB — CBC WITH DIFFERENTIAL/PLATELET
BASO%: 0.9 % (ref 0.0–2.0)
Basophils Absolute: 0.4 10*3/uL — ABNORMAL HIGH (ref 0.0–0.1)
EOS ABS: 1.1 10*3/uL — AB (ref 0.0–0.5)
EOS%: 2.9 % (ref 0.0–7.0)
HCT: 43.2 % (ref 34.8–46.6)
HGB: 12.8 g/dL (ref 11.6–15.9)
LYMPH%: 3.9 % — ABNORMAL LOW (ref 14.0–49.7)
MCH: 18.9 pg — ABNORMAL LOW (ref 25.1–34.0)
MCHC: 29.6 g/dL — ABNORMAL LOW (ref 31.5–36.0)
MCV: 63.8 fL — ABNORMAL LOW (ref 79.5–101.0)
MONO#: 1.9 10*3/uL — ABNORMAL HIGH (ref 0.1–0.9)
MONO%: 5 % (ref 0.0–14.0)
NEUT%: 87.3 % — ABNORMAL HIGH (ref 38.4–76.8)
NEUTROS ABS: 33.2 10*3/uL — AB (ref 1.5–6.5)
NRBC: 1 % — AB (ref 0–0)
PLATELETS: 121 10*3/uL — AB (ref 145–400)
RBC: 6.77 10*6/uL — AB (ref 3.70–5.45)
RDW: 26.1 % — AB (ref 11.2–14.5)
WBC: 38 10*3/uL — AB (ref 3.9–10.3)
lymph#: 1.5 10*3/uL (ref 0.9–3.3)

## 2016-10-07 LAB — TECHNOLOGIST REVIEW

## 2016-10-07 NOTE — Telephone Encounter (Signed)
Gave patient AVS and calender per 4/24 los.  

## 2016-10-07 NOTE — Progress Notes (Signed)
  [facility] OFFICE PROGRESS NOTE   Diagnosis:  Polycythemia vera  INTERVAL HISTORY:   [name] returns as scheduled. She continues Hydrea. No symptoms of thrombosis. She denies nausea/vomiting. No mouth sores. No diarrhea. No rash. She has recently noticed easy bruising. No bleeding. For the past 3-4 weeks she has had increased shortness of breath mainly at rest. She notes no worsening shortness of breath with ambulation/activity. She walks at least 20 minutes 3 times a day with no breathing issues.  Objective:  Vital signs in last 24 hours:  Blood pressure (!) 152/53, pulse 85, temperature 97.6 F (36.4 C), temperature source Oral, resp. rate 16, height 5\' 2"  (1.575 m), weight 109 lb 14.4 oz (49.9 kg), SpO2 99 %.    HEENT: No thrush or ulcers. Resp: Lungs clear bilaterally. No respiratory distress. Cardio: Regular rate and rhythm. GI: Spleen palpable 4 fingerbreadths below the left costal margin. Vascular: Trace lower leg edema bilaterally. Skin: Small ecchymosis at the left forearm.  Lab Results:  Lab Results  Component Value Date   WBC 38.0 (H) 10/07/2016   HGB 12.8 10/07/2016   HCT 43.2 10/07/2016   MCV 63.8 (L) 10/07/2016   PLT 121 (L) 10/07/2016   NEUTROABS 33.2 (H) 10/07/2016    Imaging:  No results found.  Medications: I have reviewed the patient's current medications.  Assessment/Plan: 1. Polycythemia vera-JAK-2 positive. 2. History of Iron deficiency secondary to phlebotomy. 3. Bilateral pulmonary embolism 11/27/2014-Xarelto on hold beginning 06/01/2015 secondary to rectal bleeding 4. Left lower chest/upper abdominal pain-likely related to pulmonary emboli versus painful splenomegaly 5. Indeterminate solid lesions in the left kidney on CT 12/05/2014-evaluated by urology 6. History of Thrombocytopenia-mild, likely related to hydroxyurea.  7. Rectal bleeding-most likely secondary to hemorrhoids. She has been evaluated by  gastroenterology.   Disposition:[name] appears unchanged. She will continue hydroxyurea at the current dose. We discussed the decreased platelet count. She understands to contact the office with spontaneous bruising/bleeding. She will return in 4 weeks for a follow-up CBC. We scheduled a 3 month office visit. She will follow-up with Dr. Lenna Gilford regarding the dyspnea at rest.  Plan reviewed with Dr. Benay Spice.    Ned Card ANP/GNP-BC   10/07/2016  12:23 PM

## 2016-10-13 ENCOUNTER — Telehealth: Payer: Self-pay | Admitting: Cardiology

## 2016-10-13 ENCOUNTER — Ambulatory Visit (HOSPITAL_BASED_OUTPATIENT_CLINIC_OR_DEPARTMENT_OTHER): Payer: Medicare Other | Admitting: Nurse Practitioner

## 2016-10-13 ENCOUNTER — Telehealth: Payer: Self-pay

## 2016-10-13 ENCOUNTER — Ambulatory Visit (INDEPENDENT_AMBULATORY_CARE_PROVIDER_SITE_OTHER): Payer: Medicare Other | Admitting: *Deleted

## 2016-10-13 ENCOUNTER — Other Ambulatory Visit (HOSPITAL_BASED_OUTPATIENT_CLINIC_OR_DEPARTMENT_OTHER): Payer: Medicare Other

## 2016-10-13 VITALS — BP 159/50 | HR 80 | Temp 97.8°F | Resp 16 | Wt 109.8 lb

## 2016-10-13 DIAGNOSIS — D696 Thrombocytopenia, unspecified: Secondary | ICD-10-CM

## 2016-10-13 DIAGNOSIS — I442 Atrioventricular block, complete: Secondary | ICD-10-CM

## 2016-10-13 DIAGNOSIS — D45 Polycythemia vera: Secondary | ICD-10-CM

## 2016-10-13 LAB — CBC WITH DIFFERENTIAL/PLATELET
BASO%: 0.9 % (ref 0.0–2.0)
BASOS ABS: 0.3 10*3/uL — AB (ref 0.0–0.1)
EOS%: 3.7 % (ref 0.0–7.0)
Eosinophils Absolute: 1.3 10*3/uL — ABNORMAL HIGH (ref 0.0–0.5)
HCT: 43 % (ref 34.8–46.6)
HGB: 12.7 g/dL (ref 11.6–15.9)
LYMPH%: 3.9 % — AB (ref 14.0–49.7)
MCH: 18.9 pg — ABNORMAL LOW (ref 25.1–34.0)
MCHC: 29.5 g/dL — AB (ref 31.5–36.0)
MCV: 64 fL — AB (ref 79.5–101.0)
MONO#: 2 10*3/uL — ABNORMAL HIGH (ref 0.1–0.9)
MONO%: 5.5 % (ref 0.0–14.0)
NEUT#: 30.7 10*3/uL — ABNORMAL HIGH (ref 1.5–6.5)
NEUT%: 86 % — ABNORMAL HIGH (ref 38.4–76.8)
NRBC: 2 % — AB (ref 0–0)
Platelets: 137 10*3/uL — ABNORMAL LOW (ref 145–400)
RBC: 6.72 10*6/uL — AB (ref 3.70–5.45)
RDW: 26.1 % — AB (ref 11.2–14.5)
WBC: 35.6 10*3/uL — ABNORMAL HIGH (ref 3.9–10.3)
lymph#: 1.4 10*3/uL (ref 0.9–3.3)

## 2016-10-13 LAB — TECHNOLOGIST REVIEW

## 2016-10-13 NOTE — Progress Notes (Signed)
  [facility] OFFICE PROGRESS NOTE   Diagnosis:  Polycythemia vera  INTERVAL HISTORY:   Ms. Backs returns prior to scheduled follow-up for evaluation of ecchymoses. She reports noticing multiple bruises over the forearms when she woke up this morning. She is not aware of any trauma. No other bleeding. She continues hydroxyurea 500 mg every week. She continues to have intermittent shortness of breath.  Objective:  Vital signs in last 24 hours:  Blood pressure (!) 159/50, pulse 80, temperature 97.8 F (36.6 C), temperature source Oral, resp. rate 16, weight 109 lb 12.8 oz (49.8 kg), SpO2 99 %.    HEENT: No thrush or ulcers. No bleeding within the oral cavity. Resp: Lungs clear bilaterally. Cardio: Regular rate and rhythm. GI: Spleen palpable left abdomen approximately 4 fingerbreadths below the left costal margin. Vascular: No leg edema. Neuro: Alert and oriented.  Skin: 2 small resolving ecchymoses left forearm. 3-4 cm ecchymosis right lower forearm. Small ecchymosis right upper forearm. Small ecchymosis dorsum of the right hand.   Lab Results:  Lab Results  Component Value Date   WBC 35.6 (H) 10/13/2016   HGB 12.7 10/13/2016   HCT 43.0 10/13/2016   MCV 64.0 (L) 10/13/2016   PLT 137 (L) 10/13/2016   NEUTROABS 30.7 (H) 10/13/2016    Imaging:  No results found.  Medications: I have reviewed the patient's current medications.  Assessment/Plan: 1. Polycythemia vera-JAK-2 positive. 2. History of Iron deficiency secondary to phlebotomy. 3. Bilateral pulmonary embolism 11/27/2014-Xarelto on hold beginning 06/01/2015 secondary to rectal bleeding 4. Left lower chest/upper abdominal pain-likely related to pulmonary emboli versus painful splenomegaly 5. Indeterminate solid lesions in the left kidney on CT 12/05/2014-evaluated by urology 6. History of Thrombocytopenia-mild, likely related to hydroxyurea.  7. Rectal bleeding-most likely secondary to  hemorrhoids. She has been evaluated by gastroenterology.   Disposition:Ms. Schrodt appears stable. She will continue hydroxyurea at the current dose. We discussed that the bruising may be related to a combination of mild thrombocytopenia and platelet dysfunction. We also discussed senile purpura. She understands to contact the office with progression of the current ecchymoses, new ecchymoses or other bleeding. She and her daughter expressed understanding.   She is scheduled to return for labs 11/04/2016. She has a follow-up visit 01/06/2017. She will contact the office in the interim as outlined above or with any other problems.  Plan reviewed with Dr. Benay Spice.    Ned Card ANP/GNP-BC   10/13/2016  3:22 PM

## 2016-10-13 NOTE — Telephone Encounter (Signed)
Pt daughter Juliann Pulse called stating pt has developed hematomas on her arms. 6 on 1 and 3 on the other. They are spontaneous.  They are not painful. They are not hard. None on legs. They started Friday.   s/w Dr Benay Spice and had pt come in at 215 for [place] and 245 for [facility]. Inbasket sent. Daughter Juliann Pulse called and informed. Lab entered.

## 2016-10-13 NOTE — Telephone Encounter (Signed)
Attempted to confirm remote transmission with pt. No answer and was unable to leave a message.   

## 2016-10-14 ENCOUNTER — Encounter: Payer: Self-pay | Admitting: Cardiology

## 2016-10-14 LAB — CUP PACEART REMOTE DEVICE CHECK
Battery Remaining Longevity: 26 mo
Brady Statistic AP VS Percent: 0 %
Brady Statistic AS VS Percent: 0 %
Date Time Interrogation Session: 20180430193509
Implantable Lead Implant Date: 20030616
Implantable Lead Implant Date: 20101013
Implantable Lead Location: 753859
Lead Channel Impedance Value: 377 Ohm
Lead Channel Impedance Value: 426 Ohm
Lead Channel Setting Pacing Amplitude: 2 V
Lead Channel Setting Pacing Amplitude: 2.5 V
Lead Channel Setting Pacing Pulse Width: 0.4 ms
Lead Channel Setting Sensing Sensitivity: 5.6 mV
MDC IDC LEAD LOCATION: 753860
MDC IDC LEAD SERIAL: 329051
MDC IDC MSMT BATTERY IMPEDANCE: 2034 Ohm
MDC IDC MSMT BATTERY VOLTAGE: 2.74 V
MDC IDC PG IMPLANT DT: 20101011
MDC IDC STAT BRADY AP VP PERCENT: 34 %
MDC IDC STAT BRADY AS VP PERCENT: 66 %

## 2016-10-14 NOTE — Progress Notes (Signed)
Remote pacemaker transmission.   

## 2016-10-17 ENCOUNTER — Telehealth: Payer: Self-pay | Admitting: Oncology

## 2016-10-17 NOTE — Telephone Encounter (Signed)
No LOS per 10/13/16 visit. °

## 2016-11-04 ENCOUNTER — Other Ambulatory Visit (HOSPITAL_BASED_OUTPATIENT_CLINIC_OR_DEPARTMENT_OTHER): Payer: Medicare Other

## 2016-11-04 DIAGNOSIS — D45 Polycythemia vera: Secondary | ICD-10-CM

## 2016-11-04 LAB — CBC WITH DIFFERENTIAL/PLATELET
BASO%: 0.6 % (ref 0.0–2.0)
BASOS ABS: 0.2 10*3/uL — AB (ref 0.0–0.1)
EOS%: 3.4 % (ref 0.0–7.0)
Eosinophils Absolute: 1.2 10*3/uL — ABNORMAL HIGH (ref 0.0–0.5)
HEMATOCRIT: 42.7 % (ref 34.8–46.6)
HEMOGLOBIN: 12.6 g/dL (ref 11.6–15.9)
LYMPH#: 1.3 10*3/uL (ref 0.9–3.3)
LYMPH%: 3.7 % — ABNORMAL LOW (ref 14.0–49.7)
MCH: 19.3 pg — ABNORMAL LOW (ref 25.1–34.0)
MCHC: 29.5 g/dL — ABNORMAL LOW (ref 31.5–36.0)
MCV: 65.5 fL — ABNORMAL LOW (ref 79.5–101.0)
MONO#: 1.8 10*3/uL — ABNORMAL HIGH (ref 0.1–0.9)
MONO%: 5.1 % (ref 0.0–14.0)
NEUT%: 87.2 % — AB (ref 38.4–76.8)
NEUTROS ABS: 30.8 10*3/uL — AB (ref 1.5–6.5)
PLATELETS: 236 10*3/uL (ref 145–400)
RBC: 6.52 10*6/uL — AB (ref 3.70–5.45)
RDW: 26 % — ABNORMAL HIGH (ref 11.2–14.5)
WBC: 35.3 10*3/uL — ABNORMAL HIGH (ref 3.9–10.3)
nRBC: 1 % — ABNORMAL HIGH (ref 0–0)

## 2016-11-04 LAB — TECHNOLOGIST REVIEW

## 2016-12-03 DIAGNOSIS — M79672 Pain in left foot: Secondary | ICD-10-CM | POA: Diagnosis not present

## 2016-12-03 DIAGNOSIS — M2012 Hallux valgus (acquired), left foot: Secondary | ICD-10-CM | POA: Diagnosis not present

## 2016-12-03 DIAGNOSIS — M25572 Pain in left ankle and joints of left foot: Secondary | ICD-10-CM | POA: Diagnosis not present

## 2016-12-03 DIAGNOSIS — M79671 Pain in right foot: Secondary | ICD-10-CM | POA: Diagnosis not present

## 2016-12-03 DIAGNOSIS — M2011 Hallux valgus (acquired), right foot: Secondary | ICD-10-CM | POA: Diagnosis not present

## 2016-12-03 DIAGNOSIS — M7752 Other enthesopathy of left foot: Secondary | ICD-10-CM | POA: Diagnosis not present

## 2016-12-10 DIAGNOSIS — H01024 Squamous blepharitis left upper eyelid: Secondary | ICD-10-CM | POA: Diagnosis not present

## 2016-12-10 DIAGNOSIS — Z961 Presence of intraocular lens: Secondary | ICD-10-CM | POA: Diagnosis not present

## 2016-12-10 DIAGNOSIS — H04123 Dry eye syndrome of bilateral lacrimal glands: Secondary | ICD-10-CM | POA: Diagnosis not present

## 2016-12-10 DIAGNOSIS — H01021 Squamous blepharitis right upper eyelid: Secondary | ICD-10-CM | POA: Diagnosis not present

## 2016-12-18 ENCOUNTER — Other Ambulatory Visit: Payer: Self-pay | Admitting: Nurse Practitioner

## 2016-12-18 DIAGNOSIS — D45 Polycythemia vera: Secondary | ICD-10-CM

## 2016-12-22 ENCOUNTER — Other Ambulatory Visit: Payer: Self-pay | Admitting: *Deleted

## 2016-12-22 DIAGNOSIS — M79641 Pain in right hand: Secondary | ICD-10-CM | POA: Diagnosis not present

## 2016-12-22 DIAGNOSIS — K529 Noninfective gastroenteritis and colitis, unspecified: Secondary | ICD-10-CM | POA: Diagnosis not present

## 2016-12-22 DIAGNOSIS — F5101 Primary insomnia: Secondary | ICD-10-CM | POA: Diagnosis not present

## 2016-12-22 DIAGNOSIS — I10 Essential (primary) hypertension: Secondary | ICD-10-CM | POA: Diagnosis not present

## 2016-12-22 DIAGNOSIS — D45 Polycythemia vera: Secondary | ICD-10-CM

## 2016-12-22 DIAGNOSIS — G3184 Mild cognitive impairment, so stated: Secondary | ICD-10-CM | POA: Diagnosis not present

## 2016-12-22 MED ORDER — HYDROXYUREA 500 MG PO CAPS: ORAL_CAPSULE | ORAL | 0 refills | Status: DC

## 2017-01-06 ENCOUNTER — Ambulatory Visit: Payer: Medicare Other | Admitting: Oncology

## 2017-01-06 ENCOUNTER — Other Ambulatory Visit: Payer: Medicare Other

## 2017-01-12 ENCOUNTER — Encounter: Payer: Medicare Other | Admitting: *Deleted

## 2017-01-12 ENCOUNTER — Telehealth: Payer: Self-pay | Admitting: Cardiology

## 2017-01-12 DIAGNOSIS — H1045 Other chronic allergic conjunctivitis: Secondary | ICD-10-CM | POA: Diagnosis not present

## 2017-01-12 DIAGNOSIS — H04213 Epiphora due to excess lacrimation, bilateral lacrimal glands: Secondary | ICD-10-CM | POA: Diagnosis not present

## 2017-01-12 DIAGNOSIS — H11823 Conjunctivochalasis, bilateral: Secondary | ICD-10-CM | POA: Diagnosis not present

## 2017-01-12 DIAGNOSIS — H04123 Dry eye syndrome of bilateral lacrimal glands: Secondary | ICD-10-CM | POA: Diagnosis not present

## 2017-01-12 NOTE — Telephone Encounter (Signed)
Attempted to confirm remote transmission with pt. No answer and was unable to leave a message.   

## 2017-01-16 ENCOUNTER — Encounter: Payer: Self-pay | Admitting: Cardiology

## 2017-01-23 ENCOUNTER — Other Ambulatory Visit: Payer: Medicare Other

## 2017-01-23 ENCOUNTER — Telehealth: Payer: Self-pay | Admitting: Oncology

## 2017-01-23 ENCOUNTER — Ambulatory Visit: Payer: Medicare Other | Admitting: Oncology

## 2017-01-23 NOTE — Telephone Encounter (Signed)
Patient's daughter called to cancel the appointment due to patient not feeling well and said that she would call back to reschedule

## 2017-02-12 DIAGNOSIS — I447 Left bundle-branch block, unspecified: Secondary | ICD-10-CM | POA: Diagnosis not present

## 2017-02-12 DIAGNOSIS — H353111 Nonexudative age-related macular degeneration, right eye, early dry stage: Secondary | ICD-10-CM | POA: Diagnosis not present

## 2017-02-12 DIAGNOSIS — H01024 Squamous blepharitis left upper eyelid: Secondary | ICD-10-CM | POA: Diagnosis not present

## 2017-02-12 DIAGNOSIS — M810 Age-related osteoporosis without current pathological fracture: Secondary | ICD-10-CM | POA: Diagnosis not present

## 2017-02-12 DIAGNOSIS — H16142 Punctate keratitis, left eye: Secondary | ICD-10-CM | POA: Diagnosis not present

## 2017-02-12 DIAGNOSIS — I10 Essential (primary) hypertension: Secondary | ICD-10-CM | POA: Diagnosis not present

## 2017-02-12 DIAGNOSIS — E041 Nontoxic single thyroid nodule: Secondary | ICD-10-CM | POA: Diagnosis not present

## 2017-02-12 DIAGNOSIS — D751 Secondary polycythemia: Secondary | ICD-10-CM | POA: Diagnosis not present

## 2017-02-12 DIAGNOSIS — K922 Gastrointestinal hemorrhage, unspecified: Secondary | ICD-10-CM | POA: Diagnosis not present

## 2017-02-12 DIAGNOSIS — N2889 Other specified disorders of kidney and ureter: Secondary | ICD-10-CM | POA: Diagnosis not present

## 2017-02-12 DIAGNOSIS — E782 Mixed hyperlipidemia: Secondary | ICD-10-CM | POA: Diagnosis not present

## 2017-02-12 DIAGNOSIS — H01021 Squamous blepharitis right upper eyelid: Secondary | ICD-10-CM | POA: Diagnosis not present

## 2017-02-12 DIAGNOSIS — M6281 Muscle weakness (generalized): Secondary | ICD-10-CM | POA: Diagnosis not present

## 2017-02-12 DIAGNOSIS — D72829 Elevated white blood cell count, unspecified: Secondary | ICD-10-CM | POA: Diagnosis not present

## 2017-02-12 DIAGNOSIS — H1045 Other chronic allergic conjunctivitis: Secondary | ICD-10-CM | POA: Diagnosis not present

## 2017-02-12 DIAGNOSIS — G459 Transient cerebral ischemic attack, unspecified: Secondary | ICD-10-CM | POA: Diagnosis not present

## 2017-02-17 DIAGNOSIS — H04123 Dry eye syndrome of bilateral lacrimal glands: Secondary | ICD-10-CM | POA: Diagnosis not present

## 2017-02-17 DIAGNOSIS — H353111 Nonexudative age-related macular degeneration, right eye, early dry stage: Secondary | ICD-10-CM | POA: Diagnosis not present

## 2017-02-17 DIAGNOSIS — Z961 Presence of intraocular lens: Secondary | ICD-10-CM | POA: Diagnosis not present

## 2017-03-17 DIAGNOSIS — H04123 Dry eye syndrome of bilateral lacrimal glands: Secondary | ICD-10-CM | POA: Diagnosis not present

## 2017-03-17 DIAGNOSIS — Z961 Presence of intraocular lens: Secondary | ICD-10-CM | POA: Diagnosis not present

## 2017-03-17 DIAGNOSIS — H353131 Nonexudative age-related macular degeneration, bilateral, early dry stage: Secondary | ICD-10-CM | POA: Diagnosis not present

## 2017-03-18 ENCOUNTER — Ambulatory Visit (INDEPENDENT_AMBULATORY_CARE_PROVIDER_SITE_OTHER): Payer: Medicare Other | Admitting: Internal Medicine

## 2017-03-18 ENCOUNTER — Encounter: Payer: Self-pay | Admitting: Internal Medicine

## 2017-03-18 VITALS — BP 126/50 | HR 84 | Ht 62.0 in | Wt 109.0 lb

## 2017-03-18 DIAGNOSIS — Z950 Presence of cardiac pacemaker: Secondary | ICD-10-CM | POA: Diagnosis not present

## 2017-03-18 DIAGNOSIS — I10 Essential (primary) hypertension: Secondary | ICD-10-CM | POA: Diagnosis not present

## 2017-03-18 DIAGNOSIS — Z23 Encounter for immunization: Secondary | ICD-10-CM

## 2017-03-18 DIAGNOSIS — I442 Atrioventricular block, complete: Secondary | ICD-10-CM

## 2017-03-18 LAB — CUP PACEART INCLINIC DEVICE CHECK
Battery Impedance: 2173 Ohm
Battery Voltage: 2.74 V
Brady Statistic AS VS Percent: 0 %
Date Time Interrogation Session: 20181003163406
Implantable Lead Implant Date: 20101013
Implantable Lead Location: 753860
Implantable Lead Model: 4469
Implantable Lead Model: 5076
Lead Channel Setting Pacing Amplitude: 2.5 V
Lead Channel Setting Pacing Pulse Width: 0.4 ms
Lead Channel Setting Sensing Sensitivity: 5.6 mV
MDC IDC LEAD IMPLANT DT: 20030616
MDC IDC LEAD LOCATION: 753859
MDC IDC LEAD SERIAL: 329051
MDC IDC MSMT BATTERY REMAINING LONGEVITY: 24 mo
MDC IDC MSMT LEADCHNL RA IMPEDANCE VALUE: 426 Ohm
MDC IDC MSMT LEADCHNL RA PACING THRESHOLD AMPLITUDE: 1 V
MDC IDC MSMT LEADCHNL RA PACING THRESHOLD PULSEWIDTH: 0.4 ms
MDC IDC MSMT LEADCHNL RA SENSING INTR AMPL: 4 mV
MDC IDC MSMT LEADCHNL RV IMPEDANCE VALUE: 374 Ohm
MDC IDC MSMT LEADCHNL RV PACING THRESHOLD AMPLITUDE: 0.5 V
MDC IDC MSMT LEADCHNL RV PACING THRESHOLD PULSEWIDTH: 0.4 ms
MDC IDC PG IMPLANT DT: 20101011
MDC IDC SET LEADCHNL RA PACING AMPLITUDE: 2 V
MDC IDC STAT BRADY AP VP PERCENT: 34 %
MDC IDC STAT BRADY AP VS PERCENT: 0 %
MDC IDC STAT BRADY AS VP PERCENT: 65 %

## 2017-03-18 NOTE — Patient Instructions (Addendum)
Medication Instructions:  Your physician recommends that you continue on your current medications as directed. Please refer to the Current Medication list given to you today.   Labwork: None ordered   Testing/Procedures: Flu shot given 03/18/2017   Follow-Up: Remote monitoring is used to monitor your Pacemaker from home. This monitoring reduces the number of office visits required to check your device to one time per year. It allows [place] to keep an eye on the functioning of your device to ensure it is working properly. You are scheduled for a device check from home on 06/17/2017. You may send your transmission at any time that day. If you have a wireless device, the transmission will be sent automatically. After your physician reviews your transmission, you will receive a postcard with your next transmission date.   Your physician wants you to follow-up in: 12 months with Dr. Rayann Heman. You will receive a reminder letter in the mail two months in advance. If you don't receive a letter, please call our office to schedule the follow-up appointment.    Any Other Special Instructions Will Be Listed Below (If Applicable).     If you need a refill on your cardiac medications before your next appointment, please call your pharmacy.

## 2017-03-18 NOTE — Progress Notes (Signed)
PCP: Harlan Stains, MD   Primary EP:  Dr Rayann Heman  [name] is a 83 y.o. female who presents today for routine electrophysiology followup.  Since last being seen in our clinic, the patient reports doing very well.  Her primary concern is with poor vision.  Today, she denies symptoms of palpitations, chest pain, shortness of breath,  lower extremity edema, dizziness, presyncope, or syncope.  The patient is otherwise without complaint today.   Past Medical History:  Diagnosis Date  . Balance problem   . Complete heart block ([place])   . Dyspnea   . GI bleed   . Gout   . Hypercalcemia   . Hypercholesterolemia   . Hypertension   . Left bundle branch block   . Leukocytosis   . Osteopenia   . Polycythemia   . Polycythemia vera(238.4)   . Pulmonary emboli ([place])   . Stroke ([place])   . Syncope   . Thyroid nodule    Past Surgical History:  Procedure Laterality Date  . APPENDECTOMY    . ESOPHAGOGASTRODUODENOSCOPY N/A 11/30/2014   Procedure: ESOPHAGOGASTRODUODENOSCOPY (EGD);  Surgeon: Laurence Spates, MD;  Location: [facility] ENDOSCOPY;  Service: Endoscopy;  Laterality: N/A;  . HAND SURGERY     S/P Carpal tunnel repair  . PACEMAKER INSERTION  03/26/09   MDT Adapta DR, revised for RV lead fracture by Dr Doreatha Lew  . TRANSTHORACIC ECHOCARDIOGRAM  03/26/2009  . US ECHOCARDIOGRAPHY  03/01/2009   EF 55-60%    ROS- all systems are reviewed and negative except as per HPI above  Current Outpatient Prescriptions  Medication Sig Dispense Refill  . amLODipine (NORVASC) 2.5 MG tablet Take 2.5 mg by mouth daily.    . calcium-vitamin D (OSCAL WITH D) 500-200 MG-UNIT per tablet Take 2 tablets by mouth daily.     Marland Kitchen donepezil (ARICEPT) 5 MG tablet Take 5 mg by mouth at bedtime.   2  . fluticasone (FLONASE) 50 MCG/ACT nasal spray USE 2 SPRAYS INTO EACH NOSTRIL EVERY DAY  12  . hydrocortisone (ANUSOL-HC) 25 MG suppository     . hydroxyurea (HYDREA) 500 MG capsule TAKE ONE CAPSULE BY MOUTH ONCE A WEEK ON  THURSDAYS 12 capsule 0  . latanoprost (XALATAN) 0.005 % ophthalmic solution Place 1 drop into both eyes 2 (two) times daily.    Marland Kitchen loratadine (CLARITIN) 10 MG tablet Take 10 mg by mouth daily.    . memantine (NAMENDA) 10 MG tablet     . Multiple Vitamins-Minerals (PRESERVISION AREDS 2 PO) Take by mouth as directed. Eye vitamin    . NON FORMULARY Apply 4 drops to eye daily.    Marland Kitchen omeprazole (PRILOSEC) 40 MG capsule Take 40 mg by mouth daily.    Vladimir Faster Glycol-Propyl Glycol (SYSTANE ULTRA OP) Apply 1 drop to eye 2 (two) times daily.     Marland Kitchen pyridOXINE (VITAMIN B-6) 100 MG tablet Take 100 mg by mouth daily.    . Wheat Dextrin (BENEFIBER DRINK MIX PO) Take 1 application by mouth daily.     . simvastatin (ZOCOR) 10 MG tablet Take 10 mg by mouth at bedtime.      No current facility-administered medications for this visit.     Physical Exam: Vitals:   03/18/17 1512  BP: (!) 126/50  Pulse: 84  SpO2: 96%  Weight: 109 lb (49.4 kg)  Height: 5\' 2"  (1.575 m)    GEN- The patient is well appearing, alert and oriented x 3 today.   Head- normocephalic, atraumatic  Eyes-  Sclera clear, conjunctiva pink Ears- hearing intact Oropharynx- clear Lungs- Clear to ausculation bilaterally, normal work of breathing Chest- pacemaker pocket is well healed Heart- Regular rate and rhythm, no murmurs, rubs or gallops, PMI not laterally displaced GI- soft, NT, ND, + BS Extremities- no clubbing, cyanosis, or edema  Pacemaker interrogation- reviewed in detail today,  See PACEART report    Assessment and Plan:  1. Symptomatic complete heart block Normal pacemaker function See Pace Art report Noise on atrial lead noticed.  Atrial sensitivity changed from 0.5 to 0.7  2. HTN Stable No change required today  3. Prior stoke and PTE On xarelto for PTE previously,  This has been discontinued by the patient. Device reveals atrial lead noise but no true afib No changes today  [facility] Return to see me in a  year  Thompson Grayer MD, [facility] 03/18/2017 3:24 PM

## 2017-03-19 ENCOUNTER — Other Ambulatory Visit: Payer: Self-pay | Admitting: Nurse Practitioner

## 2017-03-19 DIAGNOSIS — D45 Polycythemia vera: Secondary | ICD-10-CM

## 2017-03-19 NOTE — Telephone Encounter (Signed)
Called pt's daughter to follow up canceled appts. She reports pt is taking Hydrea and does want to reschedule. Message to schedulers to call daughter with appt.

## 2017-03-20 ENCOUNTER — Telehealth: Payer: Self-pay | Admitting: Oncology

## 2017-03-20 NOTE — Telephone Encounter (Signed)
Scheduled appt per 10/4 sch message - patient is aware of appt date and time.

## 2017-03-26 ENCOUNTER — Telehealth: Payer: Self-pay | Admitting: *Deleted

## 2017-03-26 ENCOUNTER — Telehealth: Payer: Self-pay | Admitting: Oncology

## 2017-03-26 ENCOUNTER — Other Ambulatory Visit: Payer: Medicare Other

## 2017-03-26 NOTE — Telephone Encounter (Signed)
FYI "[name] has a lab appointment at 11:00 am.  Dr. Benay Spice is trying to bring her up to date for lab work but she will not be able to come in today.  Please have a scheduler call the first of next week to reschedule.  Has not had lab in about three months."  Scheduling message sent with request to reschedule.

## 2017-03-26 NOTE — Telephone Encounter (Signed)
Spoke with patients daughter and rescheduled her lab per 10/11 sch msg

## 2017-03-27 NOTE — Telephone Encounter (Signed)
Please schedule her for office and lab with Benay Spice or Lattie Haw next 2 weeks

## 2017-03-31 ENCOUNTER — Ambulatory Visit (HOSPITAL_BASED_OUTPATIENT_CLINIC_OR_DEPARTMENT_OTHER): Payer: Medicare Other | Admitting: Nurse Practitioner

## 2017-03-31 ENCOUNTER — Other Ambulatory Visit (HOSPITAL_BASED_OUTPATIENT_CLINIC_OR_DEPARTMENT_OTHER): Payer: Medicare Other

## 2017-03-31 ENCOUNTER — Telehealth: Payer: Self-pay | Admitting: Nurse Practitioner

## 2017-03-31 VITALS — BP 147/59 | HR 78 | Temp 97.7°F | Resp 18 | Ht 62.0 in | Wt 108.9 lb

## 2017-03-31 DIAGNOSIS — K625 Hemorrhage of anus and rectum: Secondary | ICD-10-CM

## 2017-03-31 DIAGNOSIS — D45 Polycythemia vera: Secondary | ICD-10-CM

## 2017-03-31 DIAGNOSIS — I2699 Other pulmonary embolism without acute cor pulmonale: Secondary | ICD-10-CM

## 2017-03-31 LAB — CBC WITH DIFFERENTIAL/PLATELET
BASO%: 0.6 % (ref 0.0–2.0)
BASOS ABS: 0.3 10*3/uL — AB (ref 0.0–0.1)
EOS%: 3 % (ref 0.0–7.0)
Eosinophils Absolute: 1.2 10*3/uL — ABNORMAL HIGH (ref 0.0–0.5)
HCT: 48.2 % — ABNORMAL HIGH (ref 34.8–46.6)
HGB: 14.2 g/dL (ref 11.6–15.9)
LYMPH%: 3.7 % — ABNORMAL LOW (ref 14.0–49.7)
MCH: 20.1 pg — AB (ref 25.1–34.0)
MCHC: 29.5 g/dL — ABNORMAL LOW (ref 31.5–36.0)
MCV: 68.1 fL — ABNORMAL LOW (ref 79.5–101.0)
MONO#: 2 10*3/uL — ABNORMAL HIGH (ref 0.1–0.9)
MONO%: 4.9 % (ref 0.0–14.0)
NEUT#: 35.7 10*3/uL — ABNORMAL HIGH (ref 1.5–6.5)
NEUT%: 87.8 % — ABNORMAL HIGH (ref 38.4–76.8)
Platelets: 223 10*3/uL (ref 145–400)
RBC: 7.08 10*6/uL — AB (ref 3.70–5.45)
RDW: 26.3 % — AB (ref 11.2–14.5)
WBC: 40.7 10*3/uL — ABNORMAL HIGH (ref 3.9–10.3)
lymph#: 1.5 10*3/uL (ref 0.9–3.3)
nRBC: 1 % — ABNORMAL HIGH (ref 0–0)

## 2017-03-31 LAB — TECHNOLOGIST REVIEW

## 2017-03-31 NOTE — Progress Notes (Signed)
  [place] OFFICE PROGRESS NOTE   Diagnosis:  Polycythemia vera  INTERVAL HISTORY:   [name] returns for follow-up. She was last seen at the [facility] 10/13/2016. She did not return for scheduled follow-up in July of this year.  She continues hydroxyurea 500 mg every Thursday. She has occasional rectal bleeding. She notes intermittent bruising over the forearms. No symptoms of thrombosis. She reports being diagnosed with macular degeneration.  Objective:  Vital signs in last 24 hours:  Blood pressure (!) 147/59, pulse 78, temperature 97.7 F (36.5 C), temperature source Oral, resp. rate 18, height 5\' 2"  (1.575 m), weight 108 lb 14.4 oz (49.4 kg), SpO2 99 %.    HEENT: no thrush or ulcers. Resp: lungs clear bilaterally. Cardio: regular rate and rhythm. GI: abdomen is soft. Spleen is palpable left abdomen close to the level of the umbilicus. Vascular: no leg edema.  Lab Results:  Lab Results  Component Value Date   WBC 40.7 (H) 03/31/2017   HGB 14.2 03/31/2017   HCT 48.2 (H) 03/31/2017   MCV 68.1 (L) 03/31/2017   PLT 223 03/31/2017   NEUTROABS 35.7 (H) 03/31/2017    Imaging:  No results found.  Medications: I have reviewed the patient's current medications.  Assessment/Plan: 1. Polycythemia vera-JAK-2 positive. 2. History of Iron deficiency secondary to phlebotomy. 3. Bilateral pulmonary embolism 11/27/2014-Xarelto on hold beginning 06/01/2015 secondary to rectal bleeding 4. Left lower chest/upper abdominal pain-likely related to pulmonary emboli versus painful splenomegaly 5. Indeterminate solid lesions in the left kidney on CT 12/05/2014-evaluated by urology 6. History of Thrombocytopenia-mild, likely related to hydroxyurea.  7. Rectal bleeding-most likely secondary to hemorrhoids. She has been evaluated by gastroenterology   Disposition: [name] appears unchanged. The hematocrit is above goal range at 48%. For now she will  continue hydroxyurea 500 mg every week. She will return for a follow-up CBC in one month and an office visit in 2 months. She will contact the office in the interim with any problems.  Plan reviewed with Dr. Benay Spice.    [name] ANP/GNP-BC   03/31/2017  3:41 PM

## 2017-03-31 NOTE — Telephone Encounter (Signed)
Scheduled appt per 10/16 los - Gave patient AVS and calender per los.  

## 2017-04-27 ENCOUNTER — Other Ambulatory Visit: Payer: Self-pay | Admitting: Family Medicine

## 2017-04-27 ENCOUNTER — Ambulatory Visit
Admission: RE | Admit: 2017-04-27 | Discharge: 2017-04-27 | Disposition: A | Discharge status: Home / Self Care | Payer: Medicare Other | Source: Ambulatory Visit | Attending: Family Medicine | Admitting: Family Medicine

## 2017-04-27 ENCOUNTER — Other Ambulatory Visit (HOSPITAL_BASED_OUTPATIENT_CLINIC_OR_DEPARTMENT_OTHER): Payer: Medicare Other

## 2017-04-27 DIAGNOSIS — R161 Splenomegaly, not elsewhere classified: Secondary | ICD-10-CM | POA: Diagnosis not present

## 2017-04-27 DIAGNOSIS — G3184 Mild cognitive impairment, so stated: Secondary | ICD-10-CM | POA: Diagnosis not present

## 2017-04-27 DIAGNOSIS — R140 Abdominal distension (gaseous): Secondary | ICD-10-CM

## 2017-04-27 DIAGNOSIS — D751 Secondary polycythemia: Secondary | ICD-10-CM | POA: Diagnosis not present

## 2017-04-27 DIAGNOSIS — D45 Polycythemia vera: Secondary | ICD-10-CM | POA: Diagnosis present

## 2017-04-27 DIAGNOSIS — Z23 Encounter for immunization: Secondary | ICD-10-CM | POA: Diagnosis not present

## 2017-04-27 DIAGNOSIS — I10 Essential (primary) hypertension: Secondary | ICD-10-CM | POA: Diagnosis not present

## 2017-04-27 LAB — TECHNOLOGIST REVIEW

## 2017-04-27 LAB — CBC WITH DIFFERENTIAL/PLATELET
BASO%: 0.9 % (ref 0.0–2.0)
BASOS ABS: 0.4 10*3/uL — AB (ref 0.0–0.1)
EOS ABS: 1.3 10*3/uL — AB (ref 0.0–0.5)
EOS%: 3.6 % (ref 0.0–7.0)
HEMATOCRIT: 49.5 % — AB (ref 34.8–46.6)
HGB: 14.7 g/dL (ref 11.6–15.9)
LYMPH%: 4.1 % — AB (ref 14.0–49.7)
MCH: 20.4 pg — ABNORMAL LOW (ref 25.1–34.0)
MCHC: 29.7 g/dL — ABNORMAL LOW (ref 31.5–36.0)
MCV: 68.8 fL — ABNORMAL LOW (ref 79.5–101.0)
MONO#: 2.2 10*3/uL — ABNORMAL HIGH (ref 0.1–0.9)
MONO%: 5.8 % (ref 0.0–14.0)
NEUT#: 32.3 10*3/uL — ABNORMAL HIGH (ref 1.5–6.5)
NEUT%: 85.6 % — ABNORMAL HIGH (ref 38.4–76.8)
NRBC: 2 % — AB (ref 0–0)
PLATELETS: 248 10*3/uL (ref 145–400)
RBC: 7.19 10*6/uL — ABNORMAL HIGH (ref 3.70–5.45)
RDW: 26.9 % — ABNORMAL HIGH (ref 11.2–14.5)
WBC: 37.7 10*3/uL — ABNORMAL HIGH (ref 3.9–10.3)
lymph#: 1.6 10*3/uL (ref 0.9–3.3)

## 2017-04-28 ENCOUNTER — Other Ambulatory Visit: Payer: Medicare Other

## 2017-04-28 ENCOUNTER — Telehealth: Payer: Self-pay | Admitting: Emergency Medicine

## 2017-04-28 ENCOUNTER — Other Ambulatory Visit: Payer: Self-pay | Admitting: Family Medicine

## 2017-04-28 DIAGNOSIS — R140 Abdominal distension (gaseous): Secondary | ICD-10-CM

## 2017-04-28 NOTE — Telephone Encounter (Addendum)
Pts daughter states that [name] is having a CT scan done on 11/15 d/t her abd being distended and firm.Pt would like to wait to see results from that scan before proceeding with phlebotomy. Ned Card, NP had this nurse inform the pts daughter to call us when she is ready for her mother to have phlebotomy done.Pt verbalized understanding of this.   ----- Message from Owens Shark, NP sent at 04/28/2017 12:54 PM EST ----- Please let her daughter know that Dr. Benay Spice recommends phlebotomy and see when they would like to have this scheduled.

## 2017-04-30 ENCOUNTER — Ambulatory Visit
Admission: RE | Admit: 2017-04-30 | Discharge: 2017-04-30 | Disposition: A | Discharge status: Home / Self Care | Payer: Medicare Other | Source: Ambulatory Visit | Attending: Family Medicine | Admitting: Family Medicine

## 2017-04-30 DIAGNOSIS — R140 Abdominal distension (gaseous): Secondary | ICD-10-CM

## 2017-04-30 DIAGNOSIS — K5730 Diverticulosis of large intestine without perforation or abscess without bleeding: Secondary | ICD-10-CM | POA: Diagnosis not present

## 2017-04-30 DIAGNOSIS — R161 Splenomegaly, not elsewhere classified: Secondary | ICD-10-CM | POA: Diagnosis not present

## 2017-04-30 MED ORDER — IOPAMIDOL (ISOVUE-300) INJECTION 61%
100.0000 mL | Freq: Once | INTRAVENOUS | Status: AC | PRN
  Administered 2017-04-30: 100 mL via INTRAVENOUS

## 2017-05-01 ENCOUNTER — Telehealth: Payer: Self-pay | Admitting: Medical Oncology

## 2017-05-01 NOTE — Telephone Encounter (Signed)
Pt had CT abdomen per Dr Dema Severin for abdominal fullness. There are changes on CT --Dr Benay Spice notified.

## 2017-05-01 NOTE — Telephone Encounter (Signed)
Per Dr Benay Spice -pt can come in for earlier appt.to discuss tx for enlarged spleen. (Daughter did not want pt to get phlebotomy this week because of upcoming CT scan). She said it is okay to schedule a phlebotomy. I told her Dr Benay Spice will discuss it at appt. Next Friday -appt made.Schedule request sent for phlebotomy.

## 2017-05-05 ENCOUNTER — Other Ambulatory Visit: Payer: Self-pay | Admitting: *Deleted

## 2017-05-05 DIAGNOSIS — D45 Polycythemia vera: Secondary | ICD-10-CM

## 2017-05-08 ENCOUNTER — Telehealth: Payer: Self-pay | Admitting: Oncology

## 2017-05-08 ENCOUNTER — Other Ambulatory Visit (HOSPITAL_BASED_OUTPATIENT_CLINIC_OR_DEPARTMENT_OTHER): Payer: Medicare Other

## 2017-05-08 ENCOUNTER — Ambulatory Visit (HOSPITAL_BASED_OUTPATIENT_CLINIC_OR_DEPARTMENT_OTHER): Payer: Medicare Other | Admitting: Oncology

## 2017-05-08 ENCOUNTER — Ambulatory Visit (HOSPITAL_BASED_OUTPATIENT_CLINIC_OR_DEPARTMENT_OTHER): Payer: Medicare Other

## 2017-05-08 VITALS — BP 160/72 | HR 67 | Temp 98.6°F | Resp 17 | Ht 62.0 in | Wt 108.6 lb

## 2017-05-08 VITALS — BP 160/55 | HR 72 | Temp 97.7°F | Resp 16

## 2017-05-08 DIAGNOSIS — D696 Thrombocytopenia, unspecified: Secondary | ICD-10-CM | POA: Diagnosis not present

## 2017-05-08 DIAGNOSIS — R161 Splenomegaly, not elsewhere classified: Secondary | ICD-10-CM | POA: Diagnosis not present

## 2017-05-08 DIAGNOSIS — D45 Polycythemia vera: Secondary | ICD-10-CM

## 2017-05-08 DIAGNOSIS — Z86711 Personal history of pulmonary embolism: Secondary | ICD-10-CM | POA: Diagnosis not present

## 2017-05-08 LAB — CBC WITH DIFFERENTIAL/PLATELET
BASO%: 0.8 % (ref 0.0–2.0)
BASOS ABS: 0.3 10*3/uL — AB (ref 0.0–0.1)
EOS%: 4.3 % (ref 0.0–7.0)
Eosinophils Absolute: 1.7 10*3/uL — ABNORMAL HIGH (ref 0.0–0.5)
HCT: 47.3 % — ABNORMAL HIGH (ref 34.8–46.6)
HGB: 14 g/dL (ref 11.6–15.9)
LYMPH#: 1.7 10*3/uL (ref 0.9–3.3)
LYMPH%: 4.4 % — ABNORMAL LOW (ref 14.0–49.7)
MCH: 19.8 pg — AB (ref 25.1–34.0)
MCHC: 29.7 g/dL — ABNORMAL LOW (ref 31.5–36.0)
MCV: 66.8 fL — AB (ref 79.5–101.0)
MONO#: 1.7 10*3/uL — ABNORMAL HIGH (ref 0.1–0.9)
MONO%: 4.5 % (ref 0.0–14.0)
NEUT#: 33.6 10*3/uL — ABNORMAL HIGH (ref 1.5–6.5)
NEUT%: 86 % — AB (ref 38.4–76.8)
Platelets: 191 10*3/uL (ref 145–400)
RBC: 7.08 10*6/uL — AB (ref 3.70–5.45)
RDW: 28.4 % — AB (ref 11.2–14.5)
WBC: 39.1 10*3/uL — ABNORMAL HIGH (ref 3.9–10.3)
nRBC: 4 % — ABNORMAL HIGH (ref 0–0)

## 2017-05-08 LAB — TECHNOLOGIST REVIEW

## 2017-05-08 NOTE — Patient Instructions (Addendum)
Therapeutic Phlebotomy Therapeutic phlebotomy is the controlled removal of blood from a person's body for the purpose of treating a medical condition. The procedure is similar to donating blood. Usually, about a pint (470 mL, or 0.47L) of blood is removed. The average adult has 9-12 pints (4.3-5.7 L) of blood. Therapeutic phlebotomy may be used to treat the following medical conditions:  Hemochromatosis. This is a condition in which the blood contains too much iron.  Polycythemia vera. This is a condition in which the blood contains too many red blood cells.  Porphyria cutanea tarda. This is a disease in which an important part of hemoglobin is not made properly. It results in the buildup of abnormal amounts of porphyrins in the body.  Sickle cell disease. This is a condition in which the red blood cells form an abnormal crescent shape rather than a round shape.  Tell a health care provider about:  Any allergies you have.  All medicines you are taking, including vitamins, herbs, eye drops, creams, and over-the-counter medicines.  Any problems you or family members have had with anesthetic medicines.  Any blood disorders you have.  Any surgeries you have had.  Any medical conditions you have. What are the risks? Generally, this is a safe procedure. However, problems may occur, including:  Nausea or light-headedness.  Low blood pressure.  Soreness, bleeding, swelling, or bruising at the needle insertion site.  Infection.  What happens before the procedure?  Follow instructions from your health care provider about eating or drinking restrictions.  Ask your health care provider about changing or stopping your regular medicines. This is especially important if you are taking diabetes medicines or blood thinners.  Wear clothing with sleeves that can be raised above the elbow.  Plan to have someone take you home after the procedure.  You may have a blood sample taken. What  happens during the procedure?  A needle will be inserted into one of your veins.  Tubing and a collection bag will be attached to that needle.  Blood will flow through the needle and tubing into the collection bag.  You may be asked to open and close your hand slowly and continually during the entire collection.  After the specified amount of blood has been removed from your body, the collection bag and tubing will be clamped.  The needle will be removed from your vein.  Pressure will be held on the site of the needle insertion to stop the bleeding.  A bandage (dressing) will be placed over the needle insertion site. The procedure may vary among health care providers and hospitals. What happens after the procedure?  Your recovery will be assessed and monitored.  You can return to your normal activities as directed by your health care provider. This information is not intended to replace advice given to you by your health care provider. Make sure you discuss any questions you have with your health care provider. Document Released: 11/04/2010 Document Revised: 02/02/2016 Document Reviewed: 05/29/2014 Elsevier Interactive Patient Education  2018 Elsevier Inc.  

## 2017-05-08 NOTE — Telephone Encounter (Signed)
Gave avs and calendar for December  °

## 2017-05-08 NOTE — Progress Notes (Signed)
Pt phlebotomized through left AC with 20 gauge needle x 1 attempt.  503 grams removed from 5430-1484.  Pt tolerated well.  Drink and snack provided during 30 minute post observation.  Discharge teaching provided.  Pt discharged to home with daughter present.

## 2017-05-08 NOTE — Progress Notes (Signed)
  [place] OFFICE PROGRESS NOTE   Diagnosis: Polycythemia vera  INTERVAL HISTORY:   [name] returns prior to his scheduled visit.  She saw Dr. Dema Wells last week with abdominal discomfort and bloating.  A CT of the abdomen 04/30/2017 revealed massive splenomegaly.  She denies abdominal pain and nausea.  However daughter reports [name] has complained of discomfort in the left abdomen.  [name] notes a fullness in the left abdomen.  Objective:  Vital signs in last 24 hours:  Blood pressure (!) 160/72, pulse 67, temperature 98.6 F (37 C), temperature source Axillary, resp. rate 17, height 5\' 2"  (1.575 m), weight 108 lb 9.6 oz (49.3 kg), SpO2 100 %.   Resp: Lungs clear bilaterally Cardio: Regular rate and rhythm with premature beats GI: The spleen is palpable in the left abdomen extending to below the umbilicus, no hepatomegaly, no apparent ascites, 1 cm mobile cutaneous nodule overlying the spleen Vascular: No leg edema  Lab Results:  Lab Results  Component Value Date   WBC 39.1 (H) 05/08/2017   HGB 14.0 05/08/2017   HCT 47.3 (H) 05/08/2017   MCV 66.8 (L) 05/08/2017   PLT 191 05/08/2017   NEUTROABS 33.6 (H) 05/08/2017    Medications: I have reviewed the patient's current medications.  Assessment/Plan: 1. Polycythemia vera-JAK-2 positive. 2. History of Iron deficiency secondary to phlebotomy. 3. Bilateral pulmonary embolism 11/27/2014-Xarelto on hold beginning 06/01/2015 secondary to rectal bleeding 4. Left lower chest/upper abdominal pain-likely related to pulmonary emboli versus painful splenomegaly 5. Indeterminate solid lesions in the left kidney on CT 12/05/2014-evaluated by urology 6. History of Thrombocytopenia-mild, likely related to hydroxyurea.  7. Rectal bleeding-most likely secondary to hemorrhoids. She has been evaluated by gastroenterology   Disposition:  [name] has increased splenomegaly.  The splenomegaly is very likely  secondary to polycythemia vera.  I discussed treatment options with [name] and her daughter.  She appears to be symptomatic with abdominal fullness secondary to splenomegaly.  We discussed increasing the hydroxyurea versus splenic radiation.  She does not wish to consider a splenectomy.  Her platelets decreased in the past when the hydroxyurea dose was increased.  We decided to increase the hydroxyurea dose to 500 mg on Monday and Thursday with close follow-up of the CBC.  She will return for an office visit and CBC on 05/21/2017.  The hemoglobin/hematocrit are above goal range at present.  She will undergo a phlebotomy today.  I will refer her to radiation oncology for splenic radiation if the splenomegaly does not improve with hydroxyurea.  25 minutes were spent with the patient today.  The majority of the time was used for counseling and coordination of care.  Karen Coder, MD  05/08/2017  8:32 AM

## 2017-05-21 ENCOUNTER — Ambulatory Visit (HOSPITAL_BASED_OUTPATIENT_CLINIC_OR_DEPARTMENT_OTHER): Payer: Medicare Other | Admitting: Nurse Practitioner

## 2017-05-21 ENCOUNTER — Telehealth: Payer: Self-pay | Admitting: Nurse Practitioner

## 2017-05-21 ENCOUNTER — Other Ambulatory Visit (HOSPITAL_BASED_OUTPATIENT_CLINIC_OR_DEPARTMENT_OTHER): Payer: Medicare Other

## 2017-05-21 ENCOUNTER — Encounter: Payer: Self-pay | Admitting: Nurse Practitioner

## 2017-05-21 VITALS — BP 165/71 | HR 85 | Temp 97.7°F | Resp 16 | Ht 62.0 in | Wt 107.6 lb

## 2017-05-21 DIAGNOSIS — D45 Polycythemia vera: Secondary | ICD-10-CM

## 2017-05-21 LAB — CBC WITH DIFFERENTIAL/PLATELET
BASO%: 0.9 % (ref 0.0–2.0)
Basophils Absolute: 0.3 10*3/uL — ABNORMAL HIGH (ref 0.0–0.1)
EOS ABS: 1.3 10*3/uL — AB (ref 0.0–0.5)
EOS%: 4.1 % (ref 0.0–7.0)
HEMATOCRIT: 45.9 % (ref 34.8–46.6)
HGB: 13.4 g/dL (ref 11.6–15.9)
LYMPH%: 5.2 % — AB (ref 14.0–49.7)
MCH: 20.2 pg — AB (ref 25.1–34.0)
MCHC: 29.2 g/dL — AB (ref 31.5–36.0)
MCV: 69.1 fL — AB (ref 79.5–101.0)
MONO#: 1.8 10*3/uL — AB (ref 0.1–0.9)
MONO%: 5.7 % (ref 0.0–14.0)
NEUT#: 26.5 10*3/uL — ABNORMAL HIGH (ref 1.5–6.5)
NEUT%: 84.1 % — AB (ref 38.4–76.8)
PLATELETS: 166 10*3/uL (ref 145–400)
RBC: 6.64 10*6/uL — ABNORMAL HIGH (ref 3.70–5.45)
RDW: 26.3 % — ABNORMAL HIGH (ref 11.2–14.5)
WBC: 31.5 10*3/uL — ABNORMAL HIGH (ref 3.9–10.3)
lymph#: 1.6 10*3/uL (ref 0.9–3.3)
nRBC: 3 % — ABNORMAL HIGH (ref 0–0)

## 2017-05-21 LAB — TECHNOLOGIST REVIEW

## 2017-05-21 MED ORDER — HYDROXYUREA 500 MG PO CAPS: ORAL_CAPSULE | ORAL | 0 refills | Status: DC

## 2017-05-21 NOTE — Telephone Encounter (Signed)
Gave avs and calendar for December  °

## 2017-05-21 NOTE — Progress Notes (Signed)
  [place] OFFICE PROGRESS NOTE   Diagnosis: Polycythemia vera  INTERVAL HISTORY:   Ms. Prien returns as scheduled.  She continues hydroxyurea 2 days a week.  She underwent a phlebotomy 05/08/2017.  She notes abdominal discomfort is less.  Abdomen continues to feel "tight".  She denies any bleeding.  No symptoms of thrombosis.  Objective:  Vital signs in last 24 hours:  Blood pressure (!) 165/71, pulse 85, temperature 97.7 F (36.5 C), temperature source Axillary, resp. rate 16, height 5\' 2"  (1.575 m), weight 107 lb 9.6 oz (48.8 kg), SpO2 99 %.    HEENT: No thrush or ulcers. Resp: Lungs clear bilaterally. Cardio: Regular rate and rhythm. GI: Spleen is palpable left abdomen extending to below the level of the umbilicus.  No hepatomegaly. Vascular: No leg edema.    Lab Results:  Lab Results  Component Value Date   WBC 31.5 (H) 05/21/2017   HGB 13.4 05/21/2017   HCT 45.9 05/21/2017   MCV 69.1 (L) 05/21/2017   PLT 166 05/21/2017   NEUTROABS 26.5 (H) 05/21/2017    Imaging:  No results found.  Medications: I have reviewed the patient's current medications.  Assessment/Plan: 1. Polycythemia vera-JAK-2 positive. 2. History of Iron deficiency secondary to phlebotomy. 3. Bilateral pulmonary embolism 11/27/2014-Xarelto on hold beginning 06/01/2015 secondary to rectal bleeding 4. Left lower chest/upper abdominal pain-likely related to pulmonary emboli versus painful splenomegaly 5. Indeterminate solid lesions in the left kidney on CT 12/05/2014-evaluated by urology 6. History of Thrombocytopenia-mild, likely related to hydroxyurea.  7. Rectal bleeding-most likely secondary to hemorrhoids. She has been evaluated by gastroenterology  Disposition: Ms. Gladis Riffle notes less abdominal discomfort since increasing the dose of hydroxyurea.  She will continue hydroxyurea 500 mg on a Monday Thursday schedule.  The hemoglobin/hematocrit are improved.  We will hold  on further phlebotomy.  She will return for labs and a follow-up visit in 2 weeks.  She will contact the office in the interim with any problems.  Plan reviewed with Dr. Benay Spice.    Ned Card ANP/GNP-BC   05/21/2017  10:38 AM

## 2017-05-25 ENCOUNTER — Ambulatory Visit: Payer: Medicare Other | Admitting: Oncology

## 2017-05-25 ENCOUNTER — Other Ambulatory Visit: Payer: Medicare Other

## 2017-06-04 ENCOUNTER — Other Ambulatory Visit (HOSPITAL_BASED_OUTPATIENT_CLINIC_OR_DEPARTMENT_OTHER): Payer: Medicare Other

## 2017-06-04 ENCOUNTER — Ambulatory Visit
Admission: RE | Admit: 2017-06-04 | Discharge: 2017-06-04 | Disposition: A | Discharge status: Home / Self Care | Payer: Medicare Other | Source: Ambulatory Visit | Attending: Radiation Oncology | Admitting: Radiation Oncology

## 2017-06-04 ENCOUNTER — Encounter: Payer: Self-pay | Admitting: Radiation Oncology

## 2017-06-04 ENCOUNTER — Ambulatory Visit (HOSPITAL_BASED_OUTPATIENT_CLINIC_OR_DEPARTMENT_OTHER): Payer: Medicare Other | Admitting: Nurse Practitioner

## 2017-06-04 ENCOUNTER — Encounter: Payer: Self-pay | Admitting: Nurse Practitioner

## 2017-06-04 VITALS — BP 167/52 | HR 85 | Temp 97.5°F | Resp 20 | Ht 62.0 in | Wt 108.5 lb

## 2017-06-04 DIAGNOSIS — Z79899 Other long term (current) drug therapy: Secondary | ICD-10-CM | POA: Diagnosis not present

## 2017-06-04 DIAGNOSIS — Z823 Family history of stroke: Secondary | ICD-10-CM | POA: Diagnosis not present

## 2017-06-04 DIAGNOSIS — R161 Splenomegaly, not elsewhere classified: Secondary | ICD-10-CM | POA: Insufficient documentation

## 2017-06-04 DIAGNOSIS — D45 Polycythemia vera: Secondary | ICD-10-CM | POA: Diagnosis not present

## 2017-06-04 DIAGNOSIS — Z818 Family history of other mental and behavioral disorders: Secondary | ICD-10-CM | POA: Diagnosis not present

## 2017-06-04 DIAGNOSIS — Z8249 Family history of ischemic heart disease and other diseases of the circulatory system: Secondary | ICD-10-CM | POA: Diagnosis not present

## 2017-06-04 DIAGNOSIS — R109 Unspecified abdominal pain: Secondary | ICD-10-CM | POA: Diagnosis not present

## 2017-06-04 DIAGNOSIS — Z825 Family history of asthma and other chronic lower respiratory diseases: Secondary | ICD-10-CM | POA: Insufficient documentation

## 2017-06-04 DIAGNOSIS — Z9889 Other specified postprocedural states: Secondary | ICD-10-CM | POA: Insufficient documentation

## 2017-06-04 DIAGNOSIS — Z888 Allergy status to other drugs, medicaments and biological substances status: Secondary | ICD-10-CM | POA: Insufficient documentation

## 2017-06-04 LAB — CBC WITH DIFFERENTIAL/PLATELET
BASO%: 0.7 % (ref 0.0–2.0)
Basophils Absolute: 0.2 10*3/uL — ABNORMAL HIGH (ref 0.0–0.1)
EOS%: 3.2 % (ref 0.0–7.0)
Eosinophils Absolute: 1 10*3/uL — ABNORMAL HIGH (ref 0.0–0.5)
HEMATOCRIT: 44.5 % (ref 34.8–46.6)
HEMOGLOBIN: 13.2 g/dL (ref 11.6–15.9)
LYMPH#: 1.4 10*3/uL (ref 0.9–3.3)
LYMPH%: 4.4 % — ABNORMAL LOW (ref 14.0–49.7)
MCH: 20.6 pg — AB (ref 25.1–34.0)
MCHC: 29.7 g/dL — ABNORMAL LOW (ref 31.5–36.0)
MCV: 69.3 fL — ABNORMAL LOW (ref 79.5–101.0)
MONO#: 1.5 10*3/uL — AB (ref 0.1–0.9)
MONO%: 4.8 % (ref 0.0–14.0)
NEUT%: 86.9 % — AB (ref 38.4–76.8)
NEUTROS ABS: 26.8 10*3/uL — AB (ref 1.5–6.5)
Platelets: 229 10*3/uL (ref 145–400)
RBC: 6.42 10*6/uL — ABNORMAL HIGH (ref 3.70–5.45)
RDW: 25.9 % — ABNORMAL HIGH (ref 11.2–14.5)
WBC: 30.7 10*3/uL — ABNORMAL HIGH (ref 3.9–10.3)
nRBC: 1 % — ABNORMAL HIGH (ref 0–0)

## 2017-06-04 LAB — TECHNOLOGIST REVIEW

## 2017-06-04 NOTE — Progress Notes (Signed)
Add on after seeing Dr. Benay Spice today, vitals, meds weight done in [facility], patient has enlarged spleen, possible radiation to that area  2:57 PM

## 2017-06-04 NOTE — Progress Notes (Signed)
Radiation Oncology         [phone] ________________________________  Name: [name] MRN: [id number]  Date: 06/04/2017  DOB: 06-06-1923  CC:Harlan Stains, MD  Harlan Stains, MD     REFERRING PHYSICIAN: Harlan Stains, MD   DIAGNOSIS: The primary encounter diagnosis was Spleen enlarged. A diagnosis of Polycythemia rubra vera ([place]) was also pertinent to this visit.   HISTORY OF PRESENT ILLNESS::[name] is a 83 y.o. female who is seen for an initial consultation visit regarding the patient's diagnosis of splenomegaly secondary to polycythemia vera..  The patient is followed by medical oncology. She continues taking hydroxyurea 2 days a week as well as phlebotomy when necessary. The abdomen has progressively become more tight with associated discomfort and some difficulty with breathing as well. Her recent imaging demonstrated enlarged splenomegaly, now approximately 20 cm. I have been asked to see the patient today therefore for consideration of palliative radiation to the spleen.    PREVIOUS RADIATION THERAPY: No   PAST MEDICAL HISTORY:  has a past medical history of Balance problem, Complete heart block ([place]), Dyspnea, GI bleed, Gout, Hypercalcemia, Hypercholesterolemia, Hypertension, Left bundle branch block, Leukocytosis, Osteopenia, Polycythemia, Polycythemia vera(238.4), Pulmonary emboli ([place]), Stroke ([place]), Syncope, and Thyroid nodule.     PAST SURGICAL HISTORY: Past Surgical History:  Procedure Laterality Date  . APPENDECTOMY    . ESOPHAGOGASTRODUODENOSCOPY N/A 11/30/2014   Procedure: ESOPHAGOGASTRODUODENOSCOPY (EGD);  Surgeon: Laurence Spates, MD;  Location: [facility] ENDOSCOPY;  Service: Endoscopy;  Laterality: N/A;  . HAND SURGERY     S/P Carpal tunnel repair  . PACEMAKER INSERTION  03/26/09   MDT Adapta DR, revised for RV lead fracture by Dr Doreatha Lew  . TRANSTHORACIC ECHOCARDIOGRAM  03/26/2009  . US ECHOCARDIOGRAPHY  03/01/2009   EF 55-60%     FAMILY  HISTORY: family history includes Asthma in her daughter; Canavan disease in her son; Deep vein thrombosis in her daughter; Depression in her daughter; Heart disease in her son; Migraines in her daughter and son; Other in her daughter; Pulmonary embolism in her daughter; Stroke (age of onset: 64) in her son. She was adopted.   SOCIAL HISTORY:  reports that  has never smoked. she has never used smokeless tobacco. She reports that she does not drink alcohol or use drugs.   ALLERGIES: Other; Xarelto [rivaroxaban]; Chocolate; Ropinirole; and Pollen extract   MEDICATIONS:  Current Outpatient Medications  Medication Sig Dispense Refill  . amLODipine (NORVASC) 2.5 MG tablet Take 2.5 mg by mouth daily.    . calcium-vitamin D (OSCAL WITH D) 500-200 MG-UNIT per tablet Take 2 tablets by mouth daily.     . cetirizine (ZYRTEC) 10 MG tablet Take 10 mg by mouth daily.    Marland Kitchen donepezil (ARICEPT) 5 MG tablet Take 5 mg by mouth at bedtime.   2  . fluticasone (FLONASE) 50 MCG/ACT nasal spray USE 2 SPRAYS INTO EACH NOSTRIL EVERY DAY  12  . hydrocortisone (ANUSOL-HC) 25 MG suppository     . hydroxyurea (HYDREA) 500 MG capsule May take with food to minimize GI side effects. Take 1 tablet (500 mg) every Monday and Thursday. 24 capsule 0  . latanoprost (XALATAN) 0.005 % ophthalmic solution Place 1 drop into both eyes 2 (two) times daily.    Marland Kitchen loratadine (CLARITIN) 10 MG tablet Take 10 mg by mouth daily.    . memantine (NAMENDA) 10 MG tablet     . Multiple Vitamins-Minerals (PRESERVISION AREDS 2 PO) Take by mouth as directed. Eye vitamin    .  NON FORMULARY Apply 4 drops to eye daily.    Marland Kitchen omeprazole (PRILOSEC) 40 MG capsule Take 40 mg by mouth daily.    Vladimir Faster Glycol-Propyl Glycol (SYSTANE ULTRA OP) Apply 1 drop to eye 2 (two) times daily.     Marland Kitchen pyridOXINE (VITAMIN B-6) 100 MG tablet Take 100 mg by mouth daily.    . Wheat Dextrin (BENEFIBER DRINK MIX PO) Take 1 application by mouth daily.      No current  facility-administered medications for this encounter.      REVIEW OF SYSTEMS:  A 15 point review of systems is documented in the electronic medical record. This was obtained by the nursing staff. However, I reviewed this with the patient to discuss relevant findings and make appropriate changes.  Pertinent items are noted in HPI.    PHYSICAL EXAM:  vitals were not taken for this visit.   ECOG = 1  0 - Asymptomatic (Fully active, able to carry on all predisease activities without restriction)  1 - Symptomatic but completely ambulatory (Restricted in physically strenuous activity but ambulatory and able to carry out work of a light or sedentary nature. For example, light housework, office work)  2 - Symptomatic, <50% in bed during the day (Ambulatory and capable of all self care but unable to carry out any work activities. Up and about more than 50% of waking hours)  3 - Symptomatic, >50% in bed, but not bedbound (Capable of only limited self-care, confined to bed or chair 50% or more of waking hours)  4 - Bedbound (Completely disabled. Cannot carry on any self-care. Totally confined to bed or chair)  5 - Death   Eustace Pen MM, Creech RH, Tormey DC, et al. [phone]). "Toxicity and response criteria of the [facility]". Melbeta Oncol. 5 (6): 649-55  Alert, no distress   LABORATORY DATA:  Lab Results  Component Value Date   WBC 30.7 (H) 06/04/2017   HGB 13.2 06/04/2017   HCT 44.5 06/04/2017   MCV 69.3 (L) 06/04/2017   PLT 229 06/04/2017   Lab Results  Component Value Date   NA 142 06/25/2016   K 4.3 06/25/2016   CL 109 06/25/2016   CO2 23 06/25/2016   Lab Results  Component Value Date   ALT 18 11/28/2014   AST 31 11/28/2014   ALKPHOS 87 11/28/2014   BILITOT 1.1 11/28/2014      RADIOGRAPHY: No results found.     IMPRESSION/PLAN:  The patient has splenomegaly secondary to polycythemia vera. I discussed with the patient the potential role of  radiation treatment in this setting. Due to the radiosensitive nature of the spleen, a short, low-dose treatment can be quite effective. Standard treatments often are 2 weeks of treatment but the literature supports the possibility of a good response with 1 week as well. I discussed the shorter option with the patient and her daughter and all of their questions were answered. They do wish to proceed with such a treatment and will undergo simulation today.     ________________________________   Jodelle Gross, MD, PhD   **Disclaimer: This note was dictated with voice recognition software. Similar sounding words can inadvertently be transcribed and this note may contain transcription errors which may not have been corrected upon publication of note.**

## 2017-06-04 NOTE — Progress Notes (Addendum)
  [place] OFFICE PROGRESS NOTE   Diagnosis: Polycythemia vera  INTERVAL HISTORY:   [name] returns as scheduled.  She continues hydroxyurea 500 mg on a Monday Thursday schedule.  Denies nausea/vomiting.  No mouth sores.  She has occasional rectal bleeding.  She continues to have abdominal pain which now seems to be extending laterally.  She is more short of breath.  No fever, cough or chest pain.  No leg swelling or calf pain.  Objective:  Vital signs in last 24 hours:  Blood pressure (!) 167/52, pulse 85, temperature (!) 97.5 F (36.4 C), temperature source Oral, resp. rate 20, height 5\' 2"  (1.575 m), weight 108 lb 8 oz (49.2 kg), SpO2 99 %.    HEENT: No thrush or ulcers. Resp: Lungs clear bilaterally. Cardio: Regular rate and rhythm. GI: Abdomen is distended.  Spleen is markedly enlarged throughout the left abdomen.  No hepatomegaly. Vascular: No leg edema.   Lab Results:  Lab Results  Component Value Date   WBC 30.7 (H) 06/04/2017   HGB 13.2 06/04/2017   HCT 44.5 06/04/2017   MCV 69.3 (L) 06/04/2017   PLT 229 06/04/2017   NEUTROABS 26.8 (H) 06/04/2017    Imaging:  No results found.  Medications: I have reviewed the patient's current medications.  Assessment/Plan: 1. Polycythemia vera-JAK-2 positive. 2. History of Iron deficiency secondary to phlebotomy. 3. Bilateral pulmonary embolism 11/27/2014-Xarelto on hold beginning 06/01/2015 secondary to rectal bleeding 4. Left lower chest/upper abdominal pain-likely related to pulmonary emboli versus painful splenomegaly 5. Indeterminate solid lesions in the left kidney on CT 12/05/2014-evaluated by urology 6. History of Thrombocytopenia-mild, likely related to hydroxyurea.  7. Rectal bleeding-most likely secondary to hemorrhoids. She has been evaluated by gastroenterology     Disposition: [name] has polycythemia vera with marked splenomegaly.  Hydroxyurea dose was recently adjusted in  an attempt to reduce the size of the spleen and palliate pain.  In the past blood counts have not allowed for much increase in the dose of hydroxyurea.  She continues to have abdominal pain and more recently has developed dyspnea which is likely related to the splenomegaly though we did discuss other possible etiologies and will obtain a chest x-ray today.  Dr. Benay Spice recommends consideration of splenic radiation.  Dr. Lisbeth Renshaw will see [name] later today.  We are placing Hydrea on hold.  She will return for labs and a follow-up visit in 3 weeks.  She will contact the office prior to that visit with any problems.  Patient seen with Dr. Benay Spice.  25 minutes were spent face-to-face at today's visit with the majority of that time involved in counseling/coordination of care.    Ned Card ANP/GNP-BC   06/04/2017  1:58 PM  This was a shared visit with Ned Card.  [name] was interviewed and examined.  She has persistent painful splenomegaly.  The etiology of her dyspnea is unclear.  She will be referred for a chest x-ray today.  The dyspnea may be related to pulmonary restriction from splenomegaly.  I discussed the case with Dr. Lisbeth Renshaw.  He will see [name] to consider palliative radiation to the spleen.  She will discontinue hydroxyurea.  [name], MD

## 2017-06-08 ENCOUNTER — Telehealth: Payer: Self-pay | Admitting: Oncology

## 2017-06-08 NOTE — Telephone Encounter (Signed)
Spoke to patients daughter regarding upcoming January appointments.  °

## 2017-06-10 DIAGNOSIS — Z8249 Family history of ischemic heart disease and other diseases of the circulatory system: Secondary | ICD-10-CM | POA: Diagnosis not present

## 2017-06-10 DIAGNOSIS — Z823 Family history of stroke: Secondary | ICD-10-CM | POA: Diagnosis not present

## 2017-06-10 DIAGNOSIS — R161 Splenomegaly, not elsewhere classified: Secondary | ICD-10-CM | POA: Diagnosis not present

## 2017-06-10 DIAGNOSIS — Z9889 Other specified postprocedural states: Secondary | ICD-10-CM | POA: Diagnosis not present

## 2017-06-10 DIAGNOSIS — D45 Polycythemia vera: Secondary | ICD-10-CM | POA: Diagnosis not present

## 2017-06-10 DIAGNOSIS — Z825 Family history of asthma and other chronic lower respiratory diseases: Secondary | ICD-10-CM | POA: Diagnosis not present

## 2017-06-11 ENCOUNTER — Telehealth: Payer: Self-pay | Admitting: *Deleted

## 2017-06-11 NOTE — Telephone Encounter (Signed)
returned call to [name], patient's daughter, patient is cancelling radiation at this time, patient c/o increasing pain and swelling in her legs and wakes up at night crying, and is going to a foot Dr to get this taken care of first, per daughter they went to another foot Dr and waited for an hour in the dressing room and no one showed up" daughter is cancelling 06/17/17 radiation treatments per her mother's insistence, I did ask if our [place] could guive her a call , and she said yes but her mother has made up her mind to cancel any raidiation until other medical  problems are resolved 3:35 PM

## 2017-06-11 NOTE — Telephone Encounter (Signed)
Called back to daughter Juliann Pulse,  Per our PA, patient needs to not cancel appt 06/17/17  That she really needs to start radiation on her spleen, can call our PA next week, per Deniece Portela try and bring my mother  On 06/17/17, if she still refuses I'll call Bryson Ha next week,  3:42 PM

## 2017-06-15 DIAGNOSIS — M2011 Hallux valgus (acquired), right foot: Secondary | ICD-10-CM | POA: Diagnosis not present

## 2017-06-15 DIAGNOSIS — M1A3720 Chronic gout due to renal impairment, left ankle and foot, without tophus (tophi): Secondary | ICD-10-CM | POA: Diagnosis not present

## 2017-06-16 DIAGNOSIS — M2011 Hallux valgus (acquired), right foot: Secondary | ICD-10-CM | POA: Diagnosis not present

## 2017-06-16 DIAGNOSIS — M10072 Idiopathic gout, left ankle and foot: Secondary | ICD-10-CM | POA: Diagnosis not present

## 2017-06-16 DIAGNOSIS — M2012 Hallux valgus (acquired), left foot: Secondary | ICD-10-CM | POA: Diagnosis not present

## 2017-06-16 DIAGNOSIS — M109 Gout, unspecified: Secondary | ICD-10-CM | POA: Diagnosis not present

## 2017-06-17 ENCOUNTER — Ambulatory Visit
Admission: RE | Admit: 2017-06-17 | Discharge: 2017-06-17 | Disposition: A | Discharge status: Home / Self Care | Payer: Medicare Other | Source: Ambulatory Visit | Attending: Radiation Oncology | Admitting: Radiation Oncology

## 2017-06-17 ENCOUNTER — Ambulatory Visit (INDEPENDENT_AMBULATORY_CARE_PROVIDER_SITE_OTHER): Payer: Medicare Other | Admitting: *Deleted

## 2017-06-17 ENCOUNTER — Telehealth: Payer: Self-pay | Admitting: Cardiology

## 2017-06-17 DIAGNOSIS — Z825 Family history of asthma and other chronic lower respiratory diseases: Secondary | ICD-10-CM | POA: Diagnosis not present

## 2017-06-17 DIAGNOSIS — Z888 Allergy status to other drugs, medicaments and biological substances status: Secondary | ICD-10-CM | POA: Diagnosis not present

## 2017-06-17 DIAGNOSIS — Z823 Family history of stroke: Secondary | ICD-10-CM | POA: Diagnosis not present

## 2017-06-17 DIAGNOSIS — R161 Splenomegaly, not elsewhere classified: Secondary | ICD-10-CM | POA: Diagnosis not present

## 2017-06-17 DIAGNOSIS — Z8249 Family history of ischemic heart disease and other diseases of the circulatory system: Secondary | ICD-10-CM | POA: Diagnosis not present

## 2017-06-17 DIAGNOSIS — D45 Polycythemia vera: Secondary | ICD-10-CM | POA: Diagnosis not present

## 2017-06-17 DIAGNOSIS — Z818 Family history of other mental and behavioral disorders: Secondary | ICD-10-CM | POA: Diagnosis not present

## 2017-06-17 DIAGNOSIS — Z79899 Other long term (current) drug therapy: Secondary | ICD-10-CM | POA: Diagnosis not present

## 2017-06-17 DIAGNOSIS — I442 Atrioventricular block, complete: Secondary | ICD-10-CM

## 2017-06-17 DIAGNOSIS — Z9889 Other specified postprocedural states: Secondary | ICD-10-CM | POA: Diagnosis not present

## 2017-06-17 NOTE — Telephone Encounter (Signed)
Confirmed remote transmission w/ pt daughter.   

## 2017-06-18 ENCOUNTER — Other Ambulatory Visit: Payer: Self-pay | Admitting: Radiation Oncology

## 2017-06-18 ENCOUNTER — Ambulatory Visit
Admission: RE | Admit: 2017-06-18 | Discharge: 2017-06-18 | Disposition: A | Discharge status: Home / Self Care | Payer: Medicare Other | Source: Ambulatory Visit | Attending: Radiation Oncology | Admitting: Radiation Oncology

## 2017-06-18 ENCOUNTER — Other Ambulatory Visit: Payer: Self-pay | Admitting: *Deleted

## 2017-06-18 ENCOUNTER — Telehealth: Payer: Self-pay | Admitting: *Deleted

## 2017-06-18 ENCOUNTER — Other Ambulatory Visit: Payer: Self-pay

## 2017-06-18 DIAGNOSIS — Z823 Family history of stroke: Secondary | ICD-10-CM | POA: Diagnosis not present

## 2017-06-18 DIAGNOSIS — D751 Secondary polycythemia: Secondary | ICD-10-CM

## 2017-06-18 DIAGNOSIS — Z9889 Other specified postprocedural states: Secondary | ICD-10-CM | POA: Diagnosis not present

## 2017-06-18 DIAGNOSIS — D45 Polycythemia vera: Secondary | ICD-10-CM

## 2017-06-18 DIAGNOSIS — Z8249 Family history of ischemic heart disease and other diseases of the circulatory system: Secondary | ICD-10-CM | POA: Diagnosis not present

## 2017-06-18 DIAGNOSIS — R161 Splenomegaly, not elsewhere classified: Secondary | ICD-10-CM | POA: Diagnosis not present

## 2017-06-18 DIAGNOSIS — Z825 Family history of asthma and other chronic lower respiratory diseases: Secondary | ICD-10-CM | POA: Diagnosis not present

## 2017-06-18 LAB — CBC WITH DIFFERENTIAL/PLATELET
BASO%: 0.5 % (ref 0.0–2.0)
Basophils Absolute: 0.2 10*3/uL — ABNORMAL HIGH (ref 0.0–0.1)
EOS ABS: 1.2 10*3/uL — AB (ref 0.0–0.5)
EOS%: 3.4 % (ref 0.0–7.0)
HEMATOCRIT: 46.2 % (ref 34.8–46.6)
HEMOGLOBIN: 13.7 g/dL (ref 11.6–15.9)
LYMPH#: 1.5 10*3/uL (ref 0.9–3.3)
LYMPH%: 4.2 % — AB (ref 14.0–49.7)
MCH: 20.1 pg — ABNORMAL LOW (ref 25.1–34.0)
MCHC: 29.7 g/dL — AB (ref 31.5–36.0)
MCV: 67.8 fL — ABNORMAL LOW (ref 79.5–101.0)
MONO#: 1.4 10*3/uL — ABNORMAL HIGH (ref 0.1–0.9)
MONO%: 4 % (ref 0.0–14.0)
NEUT%: 87.9 % — ABNORMAL HIGH (ref 38.4–76.8)
NEUTROS ABS: 30.2 10*3/uL — AB (ref 1.5–6.5)
NRBC: 1 % — AB (ref 0–0)
PLATELETS: 575 10*3/uL — AB (ref 145–400)
RBC: 6.81 10*6/uL — ABNORMAL HIGH (ref 3.70–5.45)
RDW: 26.2 % — ABNORMAL HIGH (ref 11.2–14.5)
WBC: 34.4 10*3/uL — AB (ref 3.9–10.3)

## 2017-06-18 LAB — TECHNOLOGIST REVIEW

## 2017-06-18 NOTE — Progress Notes (Signed)
Remote pacemaker transmission.   

## 2017-06-18 NOTE — Telephone Encounter (Signed)
Called patient home, spoke with Juliann Pulse daughter,asked that patient arrive daily at 2:15pm for lab work before radtx, to check her platelets,daughter will have her here today at 2:15pm orders for cbc with diff per Dr. Lisbeth Renshaw placed today, will need to place order for cbc with dif for following days 06/19/17; 06/22/17; and 06/23/17 wll ask Bryson Ha to place those orders 9:20 AM

## 2017-06-19 ENCOUNTER — Ambulatory Visit
Admission: RE | Admit: 2017-06-19 | Discharge: 2017-06-19 | Disposition: A | Discharge status: Home / Self Care | Payer: Medicare Other | Source: Ambulatory Visit | Attending: Radiation Oncology | Admitting: Radiation Oncology

## 2017-06-19 ENCOUNTER — Encounter: Payer: Self-pay | Admitting: Cardiology

## 2017-06-19 DIAGNOSIS — D45 Polycythemia vera: Secondary | ICD-10-CM | POA: Diagnosis not present

## 2017-06-19 DIAGNOSIS — Z825 Family history of asthma and other chronic lower respiratory diseases: Secondary | ICD-10-CM | POA: Diagnosis not present

## 2017-06-19 DIAGNOSIS — Z823 Family history of stroke: Secondary | ICD-10-CM | POA: Diagnosis not present

## 2017-06-19 DIAGNOSIS — Z9889 Other specified postprocedural states: Secondary | ICD-10-CM | POA: Diagnosis not present

## 2017-06-19 DIAGNOSIS — R161 Splenomegaly, not elsewhere classified: Secondary | ICD-10-CM | POA: Diagnosis not present

## 2017-06-19 DIAGNOSIS — Z8249 Family history of ischemic heart disease and other diseases of the circulatory system: Secondary | ICD-10-CM | POA: Diagnosis not present

## 2017-06-19 LAB — CBC WITH DIFFERENTIAL/PLATELET
BASO%: 0.6 % (ref 0.0–2.0)
BASOS ABS: 0.2 10*3/uL — AB (ref 0.0–0.1)
EOS ABS: 1.1 10*3/uL — AB (ref 0.0–0.5)
EOS%: 3.1 % (ref 0.0–7.0)
HEMATOCRIT: 47.3 % — AB (ref 34.8–46.6)
HEMOGLOBIN: 14.2 g/dL (ref 11.6–15.9)
LYMPH#: 1.4 10*3/uL (ref 0.9–3.3)
LYMPH%: 3.8 % — ABNORMAL LOW (ref 14.0–49.7)
MCH: 20.3 pg — AB (ref 25.1–34.0)
MCHC: 30 g/dL — ABNORMAL LOW (ref 31.5–36.0)
MCV: 67.7 fL — AB (ref 79.5–101.0)
MONO#: 1.4 10*3/uL — AB (ref 0.1–0.9)
MONO%: 3.8 % (ref 0.0–14.0)
NEUT#: 32.1 10*3/uL — ABNORMAL HIGH (ref 1.5–6.5)
NEUT%: 88.7 % — AB (ref 38.4–76.8)
PLATELETS: 618 10*3/uL — AB (ref 145–400)
RBC: 6.99 10*6/uL — ABNORMAL HIGH (ref 3.70–5.45)
RDW: 26.3 % — AB (ref 11.2–14.5)
WBC: 36.2 10*3/uL — ABNORMAL HIGH (ref 3.9–10.3)
nRBC: 1 % — ABNORMAL HIGH (ref 0–0)

## 2017-06-19 LAB — TECHNOLOGIST REVIEW

## 2017-06-22 ENCOUNTER — Other Ambulatory Visit: Payer: Self-pay | Admitting: *Deleted

## 2017-06-22 ENCOUNTER — Ambulatory Visit
Admission: RE | Admit: 2017-06-22 | Discharge: 2017-06-22 | Disposition: A | Discharge status: Home / Self Care | Payer: Medicare Other | Source: Ambulatory Visit | Attending: Radiation Oncology | Admitting: Radiation Oncology

## 2017-06-22 DIAGNOSIS — Z8249 Family history of ischemic heart disease and other diseases of the circulatory system: Secondary | ICD-10-CM | POA: Diagnosis not present

## 2017-06-22 DIAGNOSIS — D751 Secondary polycythemia: Secondary | ICD-10-CM

## 2017-06-22 DIAGNOSIS — D45 Polycythemia vera: Secondary | ICD-10-CM | POA: Diagnosis not present

## 2017-06-22 DIAGNOSIS — Z825 Family history of asthma and other chronic lower respiratory diseases: Secondary | ICD-10-CM | POA: Diagnosis not present

## 2017-06-22 DIAGNOSIS — Z823 Family history of stroke: Secondary | ICD-10-CM | POA: Diagnosis not present

## 2017-06-22 DIAGNOSIS — Z9889 Other specified postprocedural states: Secondary | ICD-10-CM | POA: Diagnosis not present

## 2017-06-22 DIAGNOSIS — R161 Splenomegaly, not elsewhere classified: Secondary | ICD-10-CM | POA: Diagnosis not present

## 2017-06-22 LAB — CBC WITH DIFFERENTIAL (CANCER CENTER ONLY)
ABS GRANULOCYTE: 33.9 10*3/uL — AB (ref 1.5–6.5)
Basophils Absolute: 0.2 10*3/uL — ABNORMAL HIGH (ref 0.0–0.1)
Basophils Relative: 1 %
Eosinophils Absolute: 1.1 10*3/uL — ABNORMAL HIGH (ref 0.0–0.5)
Eosinophils Relative: 3 %
HEMATOCRIT: 47.3 % — AB (ref 34.8–46.6)
HEMOGLOBIN: 14 g/dL (ref 11.6–15.9)
Lymphocytes Relative: 3 %
Lymphs Abs: 1.2 10*3/uL (ref 0.9–3.3)
MCH: 20.3 pg — ABNORMAL LOW (ref 25.1–34.0)
MCHC: 29.6 g/dL — AB (ref 31.5–36.0)
MCV: 68.5 fL — AB (ref 79.5–101.0)
MONO ABS: 1.1 10*3/uL — AB (ref 0.1–0.9)
MONOS PCT: 3 %
NEUTROS ABS: 33.9 10*3/uL — AB (ref 1.5–6.5)
Neutrophils Relative %: 90 %
Platelet Count: 571 10*3/uL — ABNORMAL HIGH (ref 145–400)
RBC: 6.91 MIL/uL — AB (ref 3.70–5.45)
RDW: 26.4 % — AB (ref 11.2–16.1)
Smear Review: 2
WBC Count: 37.5 10*3/uL — ABNORMAL HIGH (ref 4.0–10.3)

## 2017-06-23 ENCOUNTER — Ambulatory Visit
Admission: RE | Admit: 2017-06-23 | Discharge: 2017-06-23 | Disposition: A | Discharge status: Home / Self Care | Payer: Medicare Other | Source: Ambulatory Visit | Attending: Radiation Oncology | Admitting: Radiation Oncology

## 2017-06-23 DIAGNOSIS — Z823 Family history of stroke: Secondary | ICD-10-CM | POA: Diagnosis not present

## 2017-06-23 DIAGNOSIS — Z825 Family history of asthma and other chronic lower respiratory diseases: Secondary | ICD-10-CM | POA: Diagnosis not present

## 2017-06-23 DIAGNOSIS — D45 Polycythemia vera: Secondary | ICD-10-CM | POA: Diagnosis not present

## 2017-06-23 DIAGNOSIS — Z8249 Family history of ischemic heart disease and other diseases of the circulatory system: Secondary | ICD-10-CM | POA: Diagnosis not present

## 2017-06-23 DIAGNOSIS — Z9889 Other specified postprocedural states: Secondary | ICD-10-CM | POA: Diagnosis not present

## 2017-06-23 DIAGNOSIS — R161 Splenomegaly, not elsewhere classified: Secondary | ICD-10-CM | POA: Diagnosis not present

## 2017-06-23 LAB — CBC WITH DIFFERENTIAL (CANCER CENTER ONLY)
Abs Granulocyte: 33.2 10*3/uL — ABNORMAL HIGH (ref 1.5–6.5)
BASOS ABS: 0.2 10*3/uL — AB (ref 0.0–0.1)
BASOS PCT: 1 %
EOS ABS: 1.1 10*3/uL — AB (ref 0.0–0.5)
EOS PCT: 3 %
HCT: 45.8 % (ref 34.8–46.6)
HEMOGLOBIN: 13.6 g/dL (ref 11.6–15.9)
LYMPHS PCT: 2 %
Lymphs Abs: 0.9 10*3/uL (ref 0.9–3.3)
MCH: 19.7 pg — ABNORMAL LOW (ref 25.1–34.0)
MCHC: 29.7 g/dL — ABNORMAL LOW (ref 31.5–36.0)
MCV: 66.2 fL — AB (ref 79.5–101.0)
Monocytes Absolute: 0.9 10*3/uL (ref 0.1–0.9)
Monocytes Relative: 3 %
NEUTROS ABS: 33.2 10*3/uL — AB (ref 1.5–6.5)
NEUTROS PCT: 91 %
PLATELETS: 563 10*3/uL — AB (ref 145–400)
RBC: 6.92 MIL/uL — ABNORMAL HIGH (ref 3.70–5.45)
RDW: 27.6 % — ABNORMAL HIGH (ref 11.2–16.1)
WBC Count: 36.3 10*3/uL — ABNORMAL HIGH (ref 4.0–10.3)

## 2017-06-23 NOTE — Progress Notes (Signed)
Per Shona Simpson, PA-C patient is safe to receive radiation therapy today. Informed Nicole, RT and Faith, RT on L4 of this finding.

## 2017-06-25 LAB — CUP PACEART REMOTE DEVICE CHECK
Battery Impedance: 2586 Ohm
Battery Voltage: 2.74 V
Brady Statistic AP VP Percent: 24 %
Brady Statistic AS VP Percent: 76 %
Date Time Interrogation Session: 20190102232143
Implantable Lead Implant Date: 20030616
Implantable Lead Implant Date: 20101013
Implantable Lead Location: 753859
Implantable Lead Location: 753860
Implantable Lead Model: 5076
Implantable Lead Serial Number: 329051
Implantable Pulse Generator Implant Date: 20101011
Lead Channel Impedance Value: 416 Ohm
Lead Channel Setting Pacing Amplitude: 2 V
Lead Channel Setting Pacing Pulse Width: 0.4 ms
MDC IDC MSMT BATTERY REMAINING LONGEVITY: 20 mo
MDC IDC MSMT LEADCHNL RV IMPEDANCE VALUE: 367 Ohm
MDC IDC SET LEADCHNL RV PACING AMPLITUDE: 2.5 V
MDC IDC SET LEADCHNL RV SENSING SENSITIVITY: 5.6 mV
MDC IDC STAT BRADY AP VS PERCENT: 0 %
MDC IDC STAT BRADY AS VS PERCENT: 0 %

## 2017-06-26 DIAGNOSIS — M2011 Hallux valgus (acquired), right foot: Secondary | ICD-10-CM | POA: Diagnosis not present

## 2017-06-26 DIAGNOSIS — M10072 Idiopathic gout, left ankle and foot: Secondary | ICD-10-CM | POA: Diagnosis not present

## 2017-06-30 ENCOUNTER — Encounter: Payer: Self-pay | Admitting: Oncology

## 2017-06-30 ENCOUNTER — Other Ambulatory Visit: Payer: Self-pay | Admitting: Emergency Medicine

## 2017-06-30 ENCOUNTER — Telehealth: Payer: Self-pay | Admitting: Nurse Practitioner

## 2017-06-30 ENCOUNTER — Inpatient Hospital Stay: Payer: Medicare Other | Attending: Oncology | Admitting: Oncology

## 2017-06-30 VITALS — BP 160/55 | HR 77 | Temp 97.8°F | Resp 16 | Wt 102.9 lb

## 2017-06-30 DIAGNOSIS — K625 Hemorrhage of anus and rectum: Secondary | ICD-10-CM | POA: Insufficient documentation

## 2017-06-30 DIAGNOSIS — D45 Polycythemia vera: Secondary | ICD-10-CM

## 2017-06-30 DIAGNOSIS — I161 Hypertensive emergency: Secondary | ICD-10-CM | POA: Diagnosis not present

## 2017-06-30 DIAGNOSIS — Z923 Personal history of irradiation: Secondary | ICD-10-CM

## 2017-06-30 DIAGNOSIS — Z86711 Personal history of pulmonary embolism: Secondary | ICD-10-CM | POA: Diagnosis not present

## 2017-06-30 DIAGNOSIS — D751 Secondary polycythemia: Secondary | ICD-10-CM

## 2017-06-30 MED ORDER — HYDROXYUREA 500 MG PO CAPS: ORAL_CAPSULE | ORAL | 0 refills | Status: DC

## 2017-06-30 NOTE — Telephone Encounter (Signed)
Gave avs and calendar for february °

## 2017-06-30 NOTE — Progress Notes (Signed)
  [place] OFFICE PROGRESS NOTE   Diagnosis: Polycythemia vera  INTERVAL HISTORY:   [name] returns as scheduled.  She completed radiation to the spleen 06/23/2017.  She has noted a decrease in the spleen size.  She has abdominal soreness.  She has developed frequent bowel movements.  Imodium helps.  Her appetite diminished during the radiation.  She reports malaise.  She has "pain all over "this includes pain in the hands, feet, and legs.  She relates this to gout.  Objective:  Vital signs in last 24 hours:  Blood pressure (!) 160/55, pulse 77, temperature 97.8 F (36.6 C), temperature source Oral, resp. rate 16, weight 102 lb 14.4 oz (46.7 kg), SpO2 100 %.    HEENT: No thrush or ulcers Resp: Lungs clear bilaterally Cardio: Regular rate and rhythm GI: No hepatomegaly, the spleen tip is palpable in the left upper abdomen.  Mild diffuse tenderness Vascular: No leg edema    Lab Results:  Lab Results  Component Value Date   WBC 36.2 (H) 06/19/2017   HGB 14.2 06/19/2017   HCT 45.8 06/23/2017   MCV 66.2 (L) 06/23/2017   PLT 618 (H) 06/19/2017   NEUTROABS 33.2 (H) 06/23/2017    CMP     Component Value Date/Time   NA 142 06/25/2016 1144   NA 142 06/22/2014 1003   K 4.3 06/25/2016 1144   K 4.9 06/22/2014 1003   CL 109 06/25/2016 1144   CO2 23 06/25/2016 1144   CO2 28 06/22/2014 1003   GLUCOSE 93 06/25/2016 1144   GLUCOSE 94 06/22/2014 1003   BUN 18 06/25/2016 1144   BUN 25.1 06/22/2014 1003   CREATININE 0.69 06/25/2016 1144   CREATININE 0.8 06/22/2014 1003   CALCIUM 9.5 06/25/2016 1144   CALCIUM 9.7 06/22/2014 1003   PROT 6.0 (L) 11/28/2014 0630   PROT 6.8 06/22/2014 1003   ALBUMIN 3.2 (L) 11/28/2014 0630   ALBUMIN 3.9 06/22/2014 1003   AST 31 11/28/2014 0630   AST 38 (H) 06/22/2014 1003   ALT 18 11/28/2014 0630   ALT 26 06/22/2014 1003   ALKPHOS 87 11/28/2014 0630   ALKPHOS 107 06/22/2014 1003   BILITOT 1.1 11/28/2014 0630   BILITOT 0.96  06/22/2014 1003   GFRNONAA >60 06/25/2016 1144   GFRAA >60 06/25/2016 1144   Medications: I have reviewed the patient's current medications.   Assessment/Plan: 1. Polycythemia vera-JAK-2 positive. 2. History of Iron deficiency secondary to phlebotomy. 3. Bilateral pulmonary embolism 11/27/2014-Xarelto on hold beginning 06/01/2015 secondary to rectal bleeding 4. Painful splenomegaly, status post palliative radiation completed 06/23/2017 5. Indeterminate solid lesions in the left kidney on CT 12/05/2014-evaluated by urology 6. History of Thrombocytopenia-mild, likely related to hydroxyurea.  7. Rectal bleeding-most likely secondary to hemorrhoids. She has been evaluated by gastroenterology  Disposition: [name] completed palliative radiation to the spleen.  The spleen is smaller on exam today.  Hopefully this will continue to improve over the next few weeks.  The malaise and frequent bowel movements may be related to radiation.  She will return for an office and lab visit on 07/20/2017.  15 minutes were spent with the patient today.  The majority of the time was used for counseling and coordination of care.  Betsy Coder, MD  06/30/2017  9:10 AM

## 2017-07-02 DIAGNOSIS — M10072 Idiopathic gout, left ankle and foot: Secondary | ICD-10-CM | POA: Diagnosis not present

## 2017-07-02 DIAGNOSIS — M2011 Hallux valgus (acquired), right foot: Secondary | ICD-10-CM | POA: Diagnosis not present

## 2017-07-03 DIAGNOSIS — M2012 Hallux valgus (acquired), left foot: Secondary | ICD-10-CM | POA: Diagnosis not present

## 2017-07-06 ENCOUNTER — Telehealth: Payer: Self-pay | Admitting: Medical Oncology

## 2017-07-06 DIAGNOSIS — M2011 Hallux valgus (acquired), right foot: Secondary | ICD-10-CM | POA: Diagnosis not present

## 2017-07-06 DIAGNOSIS — D45 Polycythemia vera: Secondary | ICD-10-CM

## 2017-07-06 DIAGNOSIS — M10072 Idiopathic gout, left ankle and foot: Secondary | ICD-10-CM | POA: Diagnosis not present

## 2017-07-06 MED ORDER — HYDROXYUREA 500 MG PO CAPS: ORAL_CAPSULE | ORAL | 0 refills | Status: DC

## 2017-07-06 NOTE — Telephone Encounter (Addendum)
Refill resent to [place]. Removed [facility] from profile

## 2017-07-07 ENCOUNTER — Encounter: Payer: Self-pay | Admitting: Radiation Oncology

## 2017-07-07 ENCOUNTER — Telehealth: Payer: Self-pay | Admitting: *Deleted

## 2017-07-07 DIAGNOSIS — M109 Gout, unspecified: Secondary | ICD-10-CM | POA: Diagnosis not present

## 2017-07-07 DIAGNOSIS — R197 Diarrhea, unspecified: Secondary | ICD-10-CM | POA: Diagnosis not present

## 2017-07-07 DIAGNOSIS — R161 Splenomegaly, not elsewhere classified: Secondary | ICD-10-CM | POA: Diagnosis not present

## 2017-07-07 DIAGNOSIS — E440 Moderate protein-calorie malnutrition: Secondary | ICD-10-CM | POA: Diagnosis not present

## 2017-07-07 NOTE — Progress Notes (Signed)
  Radiation Oncology         [phone] ________________________________  Name: DANNAE KATO MRN: [id number]  Date: 07/07/2017  DOB: 1922-07-02  End of Treatment Note  Diagnosis:  Splenomegaly secondary to polycythemia vera  Indication for treatment:  Palliative   Radiation treatment dates:   06/17/2017 - 06/23/2017       Site/dose:  Abdomen/1 Gy  5Gy in 18fx  Beams/energy:   Photon with a 3D technique.  Narrative: The patient tolerated radiation treatment relatively well.    Plan: The patient has completed radiation treatment. The patient will return to radiation oncology clinic for routine followup in one month. I advised them to call or return sooner if they have any questions or concerns related to their recovery or treatment.  ------------------------------------------------  Jodelle Gross, MD, PhD  This document serves as a record of services personally performed by Kyung Rudd, MD. It was created on his behalf by Valeta Harms, a trained medical scribe. The creation of this record is based on the scribe's personal observations and the provider's statements to them. This document has been checked and approved by the attending provider.

## 2017-07-07 NOTE — Telephone Encounter (Signed)
Ok to resume on thursday

## 2017-07-07 NOTE — Telephone Encounter (Signed)
Received call from daughter Haskel Khan re:  Pt just picked up Hydroxyurea today.  Pt had missed a dose last Thursday.   Juliann Pulse wanted to know if pt should take a dose Now today and Thursday  ;  Or  Should pt just wait until Thursday as scheduled. [facility]    Phone      [phone].

## 2017-07-08 NOTE — Addendum Note (Signed)
Encounter addended by: Kyung Rudd, MD on: 07/08/2017 10:14 AM  Actions taken: Sign clinical note

## 2017-07-08 NOTE — Progress Notes (Addendum)
  Radiation Oncology         [phone] ________________________________  Name: JANIA STEINKE MRN: [id number]  Date: 06/04/2017  DOB: 1922/07/02  SIMULATION AND TREATMENT PLANNING NOTE  DIAGNOSIS:     ICD-10-CM   1. Spleen enlarged R16.1   2. Polycythemia rubra vera ([place]) D45      Site:  abdomen  NARRATIVE:  The patient was brought to the [facility].  Identity was confirmed.  All relevant records and images related to the planned course of therapy were reviewed.   Written consent to proceed with treatment was confirmed which was freely given after reviewing the details related to the planned course of therapy had been reviewed with the patient.  Then, the patient was set-up in a stable reproducible  supine position for radiation therapy.  CT images were obtained.  Surface markings were placed.    Medically necessary complex treatment device(s) for immobilization:  Vac-lock bag.   The CT images were loaded into the planning software.  Then the target and avoidance structures were contoured.  Treatment planning then occurred.  The radiation prescription was entered and confirmed.  The number of treatment fields will be determined by the tomotherapy sinogram related to the patient's upcoming treatment I have requested : 3D Simulation  I have requested a DVH of the following structures: Target volume, left kidney, right kidney, liver.   The patient will undergo daily image guidance to ensure accurate localization of the target, and adequate minimize dose to the normal surrounding structures in close proximity to the target.  PLAN:  The patient will receive 5 Gy in 5 fractions.  ________________________________   Jodelle Gross, MD, PhD

## 2017-07-16 DIAGNOSIS — M10072 Idiopathic gout, left ankle and foot: Secondary | ICD-10-CM | POA: Diagnosis not present

## 2017-07-16 DIAGNOSIS — H04123 Dry eye syndrome of bilateral lacrimal glands: Secondary | ICD-10-CM | POA: Diagnosis not present

## 2017-07-16 DIAGNOSIS — H16142 Punctate keratitis, left eye: Secondary | ICD-10-CM | POA: Diagnosis not present

## 2017-07-16 DIAGNOSIS — M2011 Hallux valgus (acquired), right foot: Secondary | ICD-10-CM | POA: Diagnosis not present

## 2017-07-16 DIAGNOSIS — H04213 Epiphora due to excess lacrimation, bilateral lacrimal glands: Secondary | ICD-10-CM | POA: Diagnosis not present

## 2017-07-16 DIAGNOSIS — H11823 Conjunctivochalasis, bilateral: Secondary | ICD-10-CM | POA: Diagnosis not present

## 2017-07-22 DIAGNOSIS — M10072 Idiopathic gout, left ankle and foot: Secondary | ICD-10-CM | POA: Diagnosis not present

## 2017-07-22 DIAGNOSIS — M2011 Hallux valgus (acquired), right foot: Secondary | ICD-10-CM | POA: Diagnosis not present

## 2017-07-28 ENCOUNTER — Telehealth: Payer: Self-pay | Admitting: Nurse Practitioner

## 2017-07-28 ENCOUNTER — Inpatient Hospital Stay: Payer: Medicare Other

## 2017-07-28 ENCOUNTER — Encounter: Payer: Self-pay | Admitting: Radiation Oncology

## 2017-07-28 ENCOUNTER — Ambulatory Visit
Admission: RE | Admit: 2017-07-28 | Discharge: 2017-07-28 | Disposition: A | Discharge status: Home / Self Care | Payer: Medicare Other | Source: Ambulatory Visit | Attending: Radiation Oncology | Admitting: Radiation Oncology

## 2017-07-28 ENCOUNTER — Inpatient Hospital Stay: Payer: Medicare Other | Attending: Oncology | Admitting: Nurse Practitioner

## 2017-07-28 ENCOUNTER — Encounter: Payer: Self-pay | Admitting: Nurse Practitioner

## 2017-07-28 ENCOUNTER — Other Ambulatory Visit: Payer: Self-pay

## 2017-07-28 VITALS — BP 174/58 | HR 72 | Temp 97.7°F | Resp 18 | Ht 62.0 in | Wt 105.1 lb

## 2017-07-28 DIAGNOSIS — R161 Splenomegaly, not elsewhere classified: Secondary | ICD-10-CM | POA: Insufficient documentation

## 2017-07-28 DIAGNOSIS — D696 Thrombocytopenia, unspecified: Secondary | ICD-10-CM | POA: Diagnosis not present

## 2017-07-28 DIAGNOSIS — D751 Secondary polycythemia: Secondary | ICD-10-CM

## 2017-07-28 DIAGNOSIS — R197 Diarrhea, unspecified: Secondary | ICD-10-CM | POA: Insufficient documentation

## 2017-07-28 DIAGNOSIS — D45 Polycythemia vera: Secondary | ICD-10-CM

## 2017-07-28 DIAGNOSIS — Z923 Personal history of irradiation: Secondary | ICD-10-CM | POA: Insufficient documentation

## 2017-07-28 DIAGNOSIS — Z79899 Other long term (current) drug therapy: Secondary | ICD-10-CM | POA: Insufficient documentation

## 2017-07-28 DIAGNOSIS — R109 Unspecified abdominal pain: Secondary | ICD-10-CM | POA: Insufficient documentation

## 2017-07-28 DIAGNOSIS — L89319 Pressure ulcer of right buttock, unspecified stage: Secondary | ICD-10-CM | POA: Diagnosis not present

## 2017-07-28 DIAGNOSIS — Z86711 Personal history of pulmonary embolism: Secondary | ICD-10-CM | POA: Insufficient documentation

## 2017-07-28 LAB — CBC WITH DIFFERENTIAL (CANCER CENTER ONLY)
BASOS ABS: 0.1 10*3/uL (ref 0.0–0.1)
BASOS PCT: 1 %
EOS ABS: 0.7 10*3/uL — AB (ref 0.0–0.5)
EOS PCT: 4 %
HCT: 48.9 % — ABNORMAL HIGH (ref 34.8–46.6)
HEMOGLOBIN: 14.5 g/dL (ref 11.6–15.9)
LYMPHS PCT: 3 %
Lymphs Abs: 0.7 10*3/uL — ABNORMAL LOW (ref 0.9–3.3)
MCH: 21.2 pg — AB (ref 25.1–34.0)
MCHC: 29.7 g/dL — AB (ref 31.5–36.0)
MCV: 71.5 fL — ABNORMAL LOW (ref 79.5–101.0)
Monocytes Absolute: 1.2 10*3/uL — ABNORMAL HIGH (ref 0.1–0.9)
Monocytes Relative: 6 %
Neutro Abs: 17 10*3/uL — ABNORMAL HIGH (ref 1.5–6.5)
Neutrophils Relative %: 86 %
Platelet Count: 107 10*3/uL — ABNORMAL LOW (ref 145–400)
RBC: 6.84 MIL/uL — AB (ref 3.70–5.45)
RDW: 26.8 % — AB (ref 11.2–14.5)
WBC: 19.7 10*3/uL — AB (ref 3.9–10.3)
nRBC: 2 /100 WBC — ABNORMAL HIGH

## 2017-07-28 NOTE — Progress Notes (Addendum)
  [place] OFFICE PROGRESS NOTE   Diagnosis: Polycythemia vera  INTERVAL HISTORY:   [name] returns as scheduled.  She continues hydroxyurea 500 mg every Thursday.  She continues to have left-sided abdominal pain.  The pain is somewhat improved as compared to pre-radiation.  She denies nausea.  She has frequent, mainly formed stools, 2-5/day.  This is her normal bowel pattern over the past 1+ year.  No bleeding.  She reports having a "sore" at the right buttock.  Objective:  Vital signs in last 24 hours:  Blood pressure (!) 174/58, pulse 72, temperature 97.7 F (36.5 C), temperature source Oral, resp. rate 18, height 5\' 2"  (1.575 m), weight 105 lb 1.6 oz (47.7 kg), SpO2 99 %.    HEENT: No thrush or ulcers. Resp: Lungs clear bilaterally. Cardio: Regular rate and rhythm. GI: Spleen palpable left mid abdomen extending below the level of the umbilicus with associated tenderness. Liver palpable right upper abdomen. Vascular: No leg edema. Neuro: Alert and oriented. Skin: Early stage pressure ulcer right buttock.   Lab Results:  Lab Results  Component Value Date   WBC 19.7 (H) 07/28/2017   HGB 14.2 06/19/2017   HCT 48.9 (H) 07/28/2017   MCV 71.5 (L) 07/28/2017   PLT 107 (L) 07/28/2017   NEUTROABS 17.0 (H) 07/28/2017    Imaging:  No results found.  Medications: I have reviewed the patient's current medications.  Assessment/Plan: 1. Polycythemia vera-JAK-2 positive. 2. History of Iron deficiency secondary to phlebotomy. 3. Bilateral pulmonary embolism 11/27/2014-Xarelto on hold beginning 06/01/2015 secondary to rectal bleeding 4. Painful splenomegaly, status post palliative radiation completed 06/23/2017 5. Indeterminate solid lesions in the left kidney on CT 12/05/2014-evaluated by urology 6. History of Thrombocytopenia-mild, likely related to hydroxyurea.  7. Rectal bleeding-most likely secondary to hemorrhoids. She has been evaluated by  gastroenterology 8. Pressure ulcer, early stage, right buttock     Disposition: [name] appears unchanged.  She continues to have splenomegaly with associated pain/tenderness.  We reviewed today's labs.  She has thrombocytopenia.  This is likely related to hydroxyurea which will be placed on hold.  Dr. Benay Spice discussed other options to treat the splenomegaly.  [name] is not sure she wants to proceed with additional treatment.  She will return for labs and a follow-up visit for further discussion in 2 weeks.  She will contact the office in the interim with any problems.  She declines a prescription for pain medication.  She has an early stage pressure ulcer at the right buttock.  She will apply a Mepilex pad.  We also discussed relieving the pressure over this area.  Patient seen with Dr. Benay Spice.    Ned Card ANP/GNP-BC   07/28/2017  10:10 AM This was a shared visit with Ned Card.  [name] was interviewed and examined.  The spleen remains enlarged and she continues to have pain associated with this plan a megaly.  She declines pain medication.  We placed hydroxyurea on hold.  We will consider additional systemic treatment options including interferon and chemotherapy when she returns in 2 weeks.  [name], MD

## 2017-07-28 NOTE — Telephone Encounter (Signed)
Scheduled appt per 2/12 los - Gave patient AVS and calender per los.  

## 2017-07-29 NOTE — Progress Notes (Signed)
Radiation Oncology         [phone] ________________________________  Name: Karen PLEITEZ MRN: [id number]  Date of Service: 07/28/2017 DOB: 06/23/1922  Post Treatment Note  CC: Harlan Stains, MD  Harlan Stains, MD  Diagnosis:   Splenomegaly secondary to polycythemia vera  Interval Since Last Radiation:  5 weeks   06/17/2017 - 06/23/2017: Abdomen/1 Gy; 5Gy in 31fx   Narrative:  The patient returns today for routine follow-up.  She tolerated radiotherpay well and reports that a few weeks later she noticed significant reduction in the size of her spleen. She reports in the last two weeks though that she has noticed that this has increased in size again. She reports intermittent discomfort in the mid left abdomen. She is having 3-4 BMs per day and reports that she is not interested in taking pain medication for this. She denies any fevers or chills or bleeding episodes.                               ALLERGIES:  is allergic to other; xarelto [rivaroxaban]; chocolate; ropinirole; and pollen extract.  Meds: Current Outpatient Medications  Medication Sig Dispense Refill  . allopurinol (ZYLOPRIM) 100 MG tablet Take 100 mg by mouth daily.  2  . amLODipine (NORVASC) 2.5 MG tablet Take 2.5 mg by mouth daily.    . calcium-vitamin D (OSCAL WITH D) 500-200 MG-UNIT per tablet Take 2 tablets by mouth daily.     . cetirizine (ZYRTEC) 10 MG tablet Take 10 mg by mouth daily.    Marland Kitchen donepezil (ARICEPT) 5 MG tablet Take 5 mg by mouth at bedtime.   2  . fluticasone (FLONASE) 50 MCG/ACT nasal spray USE 2 SPRAYS INTO EACH NOSTRIL EVERY DAY  12  . hydrocortisone (ANUSOL-HC) 25 MG suppository     . latanoprost (XALATAN) 0.005 % ophthalmic solution Place 1 drop into both eyes 2 (two) times daily.    Marland Kitchen loratadine (CLARITIN) 10 MG tablet Take 10 mg by mouth daily.    . memantine (NAMENDA) 10 MG tablet     . Multiple Vitamins-Minerals (PRESERVISION AREDS 2 PO) Take by mouth as directed. Eye vitamin    .  NON FORMULARY Apply 4 drops to eye daily.    Marland Kitchen omeprazole (PRILOSEC) 40 MG capsule Take 40 mg by mouth daily.    Vladimir Faster Glycol-Propyl Glycol (SYSTANE ULTRA OP) Apply 1 drop to eye 2 (two) times daily.     Marland Kitchen pyridOXINE (VITAMIN B-6) 100 MG tablet Take 100 mg by mouth daily.    . Wheat Dextrin (BENEFIBER DRINK MIX PO) Take 1 application by mouth daily.      No current facility-administered medications for this encounter.     Physical Findings:  height is 5\' 2"  (1.575 m) and weight is 105 lb 1.6 oz (47.7 kg). Her oral temperature is 97.7 F (36.5 C). Her blood pressure is 174/58 (abnormal) and her pulse is 72. Her respiration is 18 and oxygen saturation is 95%.  Pain Assessment Pain Score: 5  Pain Loc: Buttocks(coccyx)/10 In general this is a well appearing caucasian female in no acute distress. She's alert and oriented x4 and appropriate throughout the examination. Cardiopulmonary assessment is negative for acute distress and she exhibits normal effort.   Lab Findings: Lab Results  Component Value Date   WBC 19.7 (H) 07/28/2017   HGB 14.2 06/19/2017   HCT 48.9 (H) 07/28/2017   MCV 71.5 (L) 07/28/2017  PLT 107 (L) 07/28/2017     Radiographic Findings: No results found.  Impression/Plan: 1. Splenomegaly secondary to polycythemia vera. The patient tolerated radiotherapy very well. We discussed that her spleen was notably smaller ot her about two weeks following her radiation, however this seems to be growing and causing more symptoms. We discussed that there was more room for additional radiotherapy, however the patient is not interested in any more therapy now. She will follow up with Dr. Benay Spice and Ned Card, NP in two weeks to review lab work and discussion of additional medication for her disease.     Carola Rhine, PAC

## 2017-08-11 ENCOUNTER — Telehealth: Payer: Self-pay | Admitting: Oncology

## 2017-08-11 ENCOUNTER — Inpatient Hospital Stay (HOSPITAL_BASED_OUTPATIENT_CLINIC_OR_DEPARTMENT_OTHER): Payer: Medicare Other | Admitting: Oncology

## 2017-08-11 ENCOUNTER — Inpatient Hospital Stay: Payer: Medicare Other

## 2017-08-11 VITALS — BP 160/64 | HR 78 | Temp 97.5°F | Resp 18 | Ht 62.0 in | Wt 103.8 lb

## 2017-08-11 DIAGNOSIS — Z923 Personal history of irradiation: Secondary | ICD-10-CM

## 2017-08-11 DIAGNOSIS — R109 Unspecified abdominal pain: Secondary | ICD-10-CM

## 2017-08-11 DIAGNOSIS — D696 Thrombocytopenia, unspecified: Secondary | ICD-10-CM | POA: Diagnosis not present

## 2017-08-11 DIAGNOSIS — Z79899 Other long term (current) drug therapy: Secondary | ICD-10-CM

## 2017-08-11 DIAGNOSIS — D45 Polycythemia vera: Secondary | ICD-10-CM

## 2017-08-11 DIAGNOSIS — L89319 Pressure ulcer of right buttock, unspecified stage: Secondary | ICD-10-CM | POA: Diagnosis not present

## 2017-08-11 DIAGNOSIS — R197 Diarrhea, unspecified: Secondary | ICD-10-CM

## 2017-08-11 DIAGNOSIS — R161 Splenomegaly, not elsewhere classified: Secondary | ICD-10-CM | POA: Diagnosis not present

## 2017-08-11 LAB — CBC WITH DIFFERENTIAL (CANCER CENTER ONLY)
Basophils Absolute: 0.1 10*3/uL (ref 0.0–0.1)
Basophils Relative: 0 %
EOS ABS: 1 10*3/uL — AB (ref 0.0–0.5)
Eosinophils Relative: 6 %
HCT: 48.9 % — ABNORMAL HIGH (ref 34.8–46.6)
HEMOGLOBIN: 14.4 g/dL (ref 11.6–15.9)
LYMPHS ABS: 1 10*3/uL (ref 0.9–3.3)
Lymphocytes Relative: 6 %
MCH: 20.8 pg — AB (ref 25.1–34.0)
MCHC: 29.4 g/dL — ABNORMAL LOW (ref 31.5–36.0)
MCV: 70.6 fL — AB (ref 79.5–101.0)
MONO ABS: 0.9 10*3/uL (ref 0.1–0.9)
MONOS PCT: 5 %
NEUTROS ABS: 14.3 10*3/uL — AB (ref 1.5–6.5)
NEUTROS PCT: 83 %
NRBC: 2 /100{WBCs} — AB
Platelet Count: 163 10*3/uL (ref 145–400)
RBC: 6.93 MIL/uL — ABNORMAL HIGH (ref 3.70–5.45)
RDW: 26.3 % — ABNORMAL HIGH (ref 11.2–14.5)
WBC Count: 17.1 10*3/uL — ABNORMAL HIGH (ref 3.9–10.3)

## 2017-08-11 NOTE — Telephone Encounter (Signed)
Appointments scheduled AVS/Calendar printed per 2/26 los °

## 2017-08-11 NOTE — Progress Notes (Signed)
[place] OFFICE PROGRESS NOTE   Diagnosis: Polycythemia vera  INTERVAL HISTORY:   [name] returns as scheduled.  She is here with her daughter.  She complains of discomfort in the left abdomen.  She is not taking pain medication.  Her chief complaint is frequent bowel movements.  She reports approximately 10 loose bowel movements per day for the past several weeks.  She is not taking Imodium.  Hydroxyurea remains on hold.  Objective:  Vital signs in last 24 hours:  Blood pressure (!) 160/64, pulse 78, temperature (!) 97.5 F (36.4 C), temperature source Oral, resp. rate 18, height 5\' 2"  (1.575 m), weight 103 lb 12.8 oz (47.1 kg), SpO2 100 %.    HEENT: No thrush Resp: Lungs clear bilaterally Cardio: Regular rate and rhythm GI: The spleen is palpable throughout the left abdomen, no hepatomegaly Vascular: No leg edema  Skin: Early stage sacral decubitus ulcer has improved    Lab Results:  Lab Results  Component Value Date   WBC 17.1 (H) 08/11/2017   HGB 14.2 06/19/2017   HCT 48.9 (H) 08/11/2017   MCV 70.6 (L) 08/11/2017   PLT 163 08/11/2017   NEUTROABS 14.3 (H) 08/11/2017    CMP     Component Value Date/Time   NA 142 06/25/2016 1144   NA 142 06/22/2014 1003   K 4.3 06/25/2016 1144   K 4.9 06/22/2014 1003   CL 109 06/25/2016 1144   CO2 23 06/25/2016 1144   CO2 28 06/22/2014 1003   GLUCOSE 93 06/25/2016 1144   GLUCOSE 94 06/22/2014 1003   BUN 18 06/25/2016 1144   BUN 25.1 06/22/2014 1003   CREATININE 0.69 06/25/2016 1144   CREATININE 0.8 06/22/2014 1003   CALCIUM 9.5 06/25/2016 1144   CALCIUM 9.7 06/22/2014 1003   PROT 6.0 (L) 11/28/2014 0630   PROT 6.8 06/22/2014 1003   ALBUMIN 3.2 (L) 11/28/2014 0630   ALBUMIN 3.9 06/22/2014 1003   AST 31 11/28/2014 0630   AST 38 (H) 06/22/2014 1003   ALT 18 11/28/2014 0630   ALT 26 06/22/2014 1003   ALKPHOS 87 11/28/2014 0630   ALKPHOS 107 06/22/2014 1003   BILITOT 1.1 11/28/2014 0630   BILITOT  0.96 06/22/2014 1003   GFRNONAA >60 06/25/2016 1144   GFRAA >60 06/25/2016 1144    Medications: I have reviewed the patient's current medications.   Assessment/Plan: 1. Polycythemia vera-JAK-2 positive. 2. History of Iron deficiency secondary to phlebotomy. 3. Bilateral pulmonary embolism 11/27/2014-Xarelto on hold beginning 06/01/2015 secondary to rectal bleeding 4. Painful splenomegaly, status post palliative radiation completed 06/23/2017 5. Indeterminate solid lesions in the left kidney on CT 12/05/2014-evaluated by urology 6. History of Thrombocytopenia-mild, likely related to hydroxyurea.  7. Rectal bleeding-most likely secondary to hemorrhoids. She has been evaluated by gastroenterology 8. Pressure ulcer, early stage, right buttock-improved 9. Diarrhea-potentially related to spleen radiation, we will check the stool for C. difficile   Disposition: [name] has polycythemia vera.  She is symptomatic with painful splenomegaly.  She does not wish to take pain medication.  I discussed treatment options for managing the splenomegaly with [name] and her daughter.  She does not wish to consider additional radiation.  She is not a candidate for interferon.  We will consider busulfan therapy, though I am concerned of the possibility of hematologic toxicity.  We would start at a low dose.  Her chief complaint today is diarrhea.  This could be related to the recent course of radiation.  She  will begin Imodium.  She will return a stool sample for the C. difficile toxin.  [name] return for an office visit in approximately 2 weeks.  Betsy Coder, MD  08/11/2017  10:56 AM

## 2017-08-12 ENCOUNTER — Telehealth: Payer: Self-pay

## 2017-08-12 NOTE — Telephone Encounter (Signed)
Received call from our lab that C. Diff specimen submitted was rejected by main hospital because it was "leaking".    Spoke with pt daughter to inform that new specimen will need to be submitted per [place] in lab. Daughter voiced understanding.

## 2017-08-18 ENCOUNTER — Telehealth: Payer: Self-pay | Admitting: Internal Medicine

## 2017-08-18 NOTE — Telephone Encounter (Signed)
Spoke to [employer] (dtr) (DPR on file).  Juliann Pulse states Dr. Rayann Heman wrote for compression hose years ago.  States that pt has thrown away packaging and so now she has no clue what to reorder. What compression level should they use?  15-20, 20-30, 30-40 or 40-50 mmHg  Will forward to Sonia Baller, RN to address with Dr. Rayann Heman tomorrow

## 2017-08-18 NOTE — Telephone Encounter (Signed)
New Message   Patients daughter is calling on behalf of mother. She is needing to know what weight to order for her mothers compression stockings. She said that Dr. Rayann Heman order the last one and she needs some new ones. Please call to discuss.

## 2017-08-19 NOTE — Telephone Encounter (Signed)
Left CVM per DPR. Notified per Dr. Verlin Fester recommends either the 15-20's or 20-30's.  Explained the difference.  Advised if possible to go to  to be fitted and price is less.  Left this nurse name and # for call back if any further questions.

## 2017-08-20 ENCOUNTER — Telehealth: Payer: Self-pay | Admitting: *Deleted

## 2017-08-20 NOTE — Telephone Encounter (Signed)
Message from pt's daughter requesting return call. Left message for her to call office.

## 2017-08-24 ENCOUNTER — Telehealth: Payer: Self-pay

## 2017-08-24 NOTE — Telephone Encounter (Signed)
Received call from pt daughter stating "Mother has been unable to submit stool sample fot C. Diff because she hasn't had any loose stools. Is there something we need to do before our next appt"? Per daughter, pt still passing stools and imodium has helped with diarrhea. Informed daughter that stool sample must be loose/liquid, pt can follow up as scheduled. Voiced understanding.

## 2017-08-26 ENCOUNTER — Telehealth: Payer: Self-pay

## 2017-08-26 NOTE — Telephone Encounter (Signed)
Returned call to daughter. Informed that no one from our office attempted to reach out and I didn't see any documentation regarding a call. Voiced understanding.

## 2017-08-28 ENCOUNTER — Inpatient Hospital Stay: Payer: Medicare Other | Attending: Oncology

## 2017-08-28 ENCOUNTER — Inpatient Hospital Stay (HOSPITAL_BASED_OUTPATIENT_CLINIC_OR_DEPARTMENT_OTHER): Payer: Medicare Other | Admitting: Nurse Practitioner

## 2017-08-28 ENCOUNTER — Encounter: Payer: Self-pay | Admitting: Nurse Practitioner

## 2017-08-28 ENCOUNTER — Telehealth: Payer: Self-pay | Admitting: Nurse Practitioner

## 2017-08-28 VITALS — BP 174/54 | HR 81 | Temp 97.9°F | Resp 18 | Ht 62.0 in | Wt 105.9 lb

## 2017-08-28 DIAGNOSIS — Z79899 Other long term (current) drug therapy: Secondary | ICD-10-CM | POA: Diagnosis not present

## 2017-08-28 DIAGNOSIS — K625 Hemorrhage of anus and rectum: Secondary | ICD-10-CM | POA: Diagnosis not present

## 2017-08-28 DIAGNOSIS — D45 Polycythemia vera: Secondary | ICD-10-CM | POA: Insufficient documentation

## 2017-08-28 DIAGNOSIS — R161 Splenomegaly, not elsewhere classified: Secondary | ICD-10-CM | POA: Insufficient documentation

## 2017-08-28 DIAGNOSIS — Z923 Personal history of irradiation: Secondary | ICD-10-CM | POA: Diagnosis not present

## 2017-08-28 LAB — CBC WITH DIFFERENTIAL (CANCER CENTER ONLY)
Basophils Absolute: 0.1 10*3/uL (ref 0.0–0.1)
Basophils Relative: 0 %
Eosinophils Absolute: 0.6 10*3/uL — ABNORMAL HIGH (ref 0.0–0.5)
Eosinophils Relative: 2 %
HEMATOCRIT: 47.1 % — AB (ref 34.8–46.6)
HEMOGLOBIN: 14 g/dL (ref 11.6–15.9)
LYMPHS ABS: 1 10*3/uL (ref 0.9–3.3)
Lymphocytes Relative: 4 %
MCH: 20.4 pg — AB (ref 25.1–34.0)
MCHC: 29.7 g/dL — AB (ref 31.5–36.0)
MCV: 68.7 fL — ABNORMAL LOW (ref 79.5–101.0)
MONO ABS: 1.1 10*3/uL — AB (ref 0.1–0.9)
MONOS PCT: 4 %
NEUTROS ABS: 23.2 10*3/uL — AB (ref 1.5–6.5)
NEUTROS PCT: 90 %
Platelet Count: 347 10*3/uL (ref 145–400)
RBC: 6.86 MIL/uL — ABNORMAL HIGH (ref 3.70–5.45)
RDW: 25.9 % — ABNORMAL HIGH (ref 11.2–14.5)
WBC Count: 26 10*3/uL — ABNORMAL HIGH (ref 3.9–10.3)

## 2017-08-28 NOTE — Progress Notes (Addendum)
  [place] OFFICE PROGRESS NOTE   Diagnosis: Polycythemia vera  INTERVAL HISTORY:   [name] returns as scheduled.  She is no longer having diarrhea.  Bowel habits are at baseline.  She has 1-3 formed stools a day.  She continues to have pain at the left abdomen.  She declines pain medication.  She overall has a good appetite.  Objective:  Vital signs in last 24 hours:  Blood pressure (!) 174/54, pulse 81, temperature 97.9 F (36.6 C), temperature source Oral, resp. rate 18, height 5\' 2"  (1.575 m), weight 105 lb 14.4 oz (48 kg), SpO2 99 %.    HEENT: No thrush or ulcers. Resp: Lungs clear bilaterally. Cardio: Regular rate and rhythm. GI: Spleen palpable left abdomen below the level of the umbilicus with associated tenderness.  No hepatomegaly. Vascular: No leg edema.   Lab Results:  Lab Results  Component Value Date   WBC 26.0 (H) 08/28/2017   HGB 14.2 06/19/2017   HCT 47.1 (H) 08/28/2017   MCV 68.7 (L) 08/28/2017   PLT 347 08/28/2017   NEUTROABS 23.2 (H) 08/28/2017    Imaging:  No results found.  Medications: I have reviewed the patient's current medications.  Assessment/Plan: 1. Polycythemia vera-JAK-2 positive. 2. History of Iron deficiency secondary to phlebotomy. 3. Bilateral pulmonary embolism 11/27/2014-Xarelto on hold beginning 06/01/2015 secondary to rectal bleeding 4. Painful splenomegaly, status post palliative radiation completed 06/23/2017 5. Indeterminate solid lesions in the left kidney on CT 12/05/2014-evaluated by urology 6. History of Thrombocytopenia-mild, likely related to hydroxyurea.  7. Rectal bleeding-most likely secondary to hemorrhoids. She has been evaluated by gastroenterology 8. Pressure ulcer, early stage, right buttock-improved 9. Diarrhea-potentially related to spleen radiation.  Resolved    Disposition: [name] appears unchanged.  The diarrhea has resolved.  She continues to have abdominal pain  secondary to splenomegaly.  She does not want pain medication.  Dr. Benay Spice discussed options to potentially improve the splenomegaly including busulfan, interferon, Jakafi.  We discussed potential toxicities associated with each.  She declines any treatment at this time.  She agrees to return for lab and follow-up in 8 weeks.  She will contact the office in the interim with any problems.  Patient seen with Dr. Benay Spice.  25 minutes were spent face-to-face at today's visit with the majority of that time involved in counseling/coordination of care.    Ned Card ANP/GNP-BC   08/28/2017  2:24 PM  This was a shared visit with Ned Card.  [name] was interviewed and examined.  She has persistent painful splenomegaly.  We discussed treatment options.  She does not wish to consider systemic therapy for reduction of the spleen size.  [name], MD

## 2017-08-28 NOTE — Telephone Encounter (Signed)
Patient declined avs and calendar of upcoming may appointments.

## 2017-09-16 ENCOUNTER — Ambulatory Visit (INDEPENDENT_AMBULATORY_CARE_PROVIDER_SITE_OTHER): Payer: Medicare Other | Admitting: *Deleted

## 2017-09-16 ENCOUNTER — Telehealth: Payer: Self-pay | Admitting: Cardiology

## 2017-09-16 DIAGNOSIS — I442 Atrioventricular block, complete: Secondary | ICD-10-CM | POA: Diagnosis not present

## 2017-09-16 NOTE — Telephone Encounter (Signed)
Confirmed remote transmission w/ pt daughter.   

## 2017-09-17 ENCOUNTER — Encounter: Payer: Self-pay | Admitting: Cardiology

## 2017-09-17 NOTE — Progress Notes (Signed)
Remote pacemaker transmission.   

## 2017-09-22 LAB — CUP PACEART REMOTE DEVICE CHECK
Battery Impedance: 2578 Ohm
Brady Statistic AP VS Percent: 0 %
Brady Statistic AS VP Percent: 73 %
Brady Statistic AS VS Percent: 0 %
Implantable Lead Implant Date: 20101013
Implantable Lead Location: 753859
Implantable Lead Location: 753860
Implantable Lead Model: 4469
Implantable Lead Model: 5076
Implantable Pulse Generator Implant Date: 20101011
Lead Channel Impedance Value: 380 Ohm
Lead Channel Setting Pacing Amplitude: 2 V
Lead Channel Setting Pacing Amplitude: 2.5 V
Lead Channel Setting Pacing Pulse Width: 0.4 ms
MDC IDC LEAD IMPLANT DT: 20030616
MDC IDC LEAD SERIAL: 329051
MDC IDC MSMT BATTERY REMAINING LONGEVITY: 20 mo
MDC IDC MSMT BATTERY VOLTAGE: 2.72 V
MDC IDC MSMT LEADCHNL RA IMPEDANCE VALUE: 410 Ohm
MDC IDC SESS DTM: 20190403203407
MDC IDC SET LEADCHNL RV SENSING SENSITIVITY: 5.6 mV
MDC IDC STAT BRADY AP VP PERCENT: 27 %

## 2017-10-01 DIAGNOSIS — D751 Secondary polycythemia: Secondary | ICD-10-CM | POA: Diagnosis not present

## 2017-10-01 DIAGNOSIS — R0609 Other forms of dyspnea: Secondary | ICD-10-CM | POA: Diagnosis not present

## 2017-10-01 DIAGNOSIS — I10 Essential (primary) hypertension: Secondary | ICD-10-CM | POA: Diagnosis not present

## 2017-10-01 DIAGNOSIS — R413 Other amnesia: Secondary | ICD-10-CM | POA: Diagnosis not present

## 2017-10-01 DIAGNOSIS — Z0000 Encounter for general adult medical examination without abnormal findings: Secondary | ICD-10-CM | POA: Diagnosis not present

## 2017-10-01 DIAGNOSIS — R161 Splenomegaly, not elsewhere classified: Secondary | ICD-10-CM | POA: Diagnosis not present

## 2017-10-01 DIAGNOSIS — E440 Moderate protein-calorie malnutrition: Secondary | ICD-10-CM | POA: Diagnosis not present

## 2017-10-09 ENCOUNTER — Encounter: Payer: Self-pay | Admitting: Hospice and Palliative Medicine

## 2017-10-09 ENCOUNTER — Non-Acute Institutional Stay: Payer: Medicare Other | Admitting: Hospice and Palliative Medicine

## 2017-10-09 DIAGNOSIS — Z515 Encounter for palliative care: Secondary | ICD-10-CM | POA: Diagnosis not present

## 2017-10-09 NOTE — Progress Notes (Signed)
PALLIATIVE CARE CONSULT VISIT   PATIENT NAME: [name] DOB: January 09, 1923 MRN: [id number]  PRIMARY CARE PROVIDER: Harlan Stains, MD  REFERRING PROVIDER: Harlan Stains, MD [address] [place], [place] [address]  RESPONSIBLE PARTY:  Daughter, Juliann Pulse [phone]   RECOMMENDATIONS and PLAN:  1. Weakness: secondary to advanced age and worsening memory as well as others outlined below. She has recently been moved from [place] to the [place] part. She is needing more assistance with her daily activities. Would recommend PT/OT. Offer to take her outside. 2. Memory loss: secondary to vascular dementia approximately early 6 stages...still ambulates with rolling seated walker, feeds self and dresses self. Forgets conversations. Speaks in full sentences. Would suggest to redirect as able and remind her that she is able to go outside. ST could be beneficial for cognitive techniques.   I spent 30 minutes providing this consultation,  from 12 to 12:30. More than 50% of the time in this consultation was spent assessing patient, interviewing staff, reviewing records and coordinating communication.   HISTORY OF PRESENT ILLNESS:  GABRELLE Wells is a 84 y.o. year old female with multiple medical problems who [place] for a decline in cognition. Palliative Care was asked to help address symptom management and5goals of care.   CODE STATUS: DNR  PPS: 50% HOSPICE ELIGIBILITY/DIAGNOSIS: TBD  PAST MEDICAL HISTORY:  Past Medical History:  Diagnosis Date  . Balance problem   . Complete heart block ([facility])   . Dyspnea   . GI bleed   . Gout   . Hypercalcemia   . Hypercholesterolemia   . Hypertension   . Left bundle branch block   . Leukocytosis   . Osteopenia   . Polycythemia   . Polycythemia vera(238.4)   . Pulmonary emboli ([facility])   . Stroke ([facility])   . Syncope   . Thyroid nodule     SOCIAL HX:  Social History   Tobacco Use  . Smoking  status: Never Smoker  . Smokeless tobacco: Never Used  . Tobacco comment: never smoked  Substance Use Topics  . Alcohol use: No    Alcohol/week: 0.0 oz    ALLERGIES:  Allergies  Allergen Reactions  . Other Other (See Comments)    Narcotics and sleep aids-hyperactivity, agitation  . Xarelto [Rivaroxaban] Other (See Comments)    bleeding  . Chocolate Other (See Comments)    migraine  . Ropinirole Other (See Comments)    Dizziness, confusion?  . Pollen Extract Other (See Comments)    sneezing     PERTINENT MEDICATIONS:  Outpatient Encounter Medications as of 10/09/2017  Medication Sig  . allopurinol (ZYLOPRIM) 100 MG tablet Take 100 mg by mouth daily.  Marland Kitchen amLODipine (NORVASC) 2.5 MG tablet Take 2.5 mg by mouth daily.  . calcium-vitamin D (OSCAL WITH D) 500-200 MG-UNIT per tablet Take 2 tablets by mouth daily.   . cetirizine (ZYRTEC) 10 MG tablet Take 10 mg by mouth daily.  Marland Kitchen donepezil (ARICEPT) 5 MG tablet Take 5 mg by mouth at bedtime.   . fluticasone (FLONASE) 50 MCG/ACT nasal spray USE 2 SPRAYS INTO EACH NOSTRIL EVERY DAY  . hydrocortisone (ANUSOL-HC) 25 MG suppository   . latanoprost (XALATAN) 0.005 % ophthalmic solution Place 1 drop into both eyes 2 (two) times daily.  Marland Kitchen loratadine (CLARITIN) 10 MG tablet Take 10 mg by mouth daily.  . memantine (NAMENDA) 10 MG tablet   . Multiple Vitamins-Minerals (PRESERVISION  AREDS 2 PO) Take by mouth as directed. Eye vitamin  . NON FORMULARY Apply 4 drops to eye daily.  Marland Kitchen omeprazole (PRILOSEC) 40 MG capsule Take 40 mg by mouth daily.  Vladimir Faster Glycol-Propyl Glycol (SYSTANE ULTRA OP) Apply 1 drop to eye 2 (two) times daily.   Marland Kitchen pyridOXINE (VITAMIN B-6) 100 MG tablet Take 100 mg by mouth daily.  . Wheat Dextrin (BENEFIBER DRINK MIX PO) Take 1 application by mouth daily.    No facility-administered encounter medications on file as of 10/09/2017.     PHYSICAL EXAM:   General: Alert, well groomed and well dressed, thin elderly female in  NAD Cardiovascular: irreg rhythm; reg rate Pulmonary: breathing easily; CTAB Abdomen: flat; active BS Extremities: thin; using all 4 Skin: thin and fragile, easily bruised  Neurological: Has conversation; follows commands; +short term memory loss; +generalized weakness  Nathanial Rancher, NP

## 2017-10-16 ENCOUNTER — Non-Acute Institutional Stay: Payer: Medicare Other | Admitting: Hospice and Palliative Medicine

## 2017-10-16 DIAGNOSIS — Z515 Encounter for palliative care: Secondary | ICD-10-CM | POA: Diagnosis not present

## 2017-10-16 NOTE — Progress Notes (Signed)
PALLIATIVE CARE CONSULT VISIT   PATIENT NAME: [name] DOB: 1922/06/22 MRN: [id number]  PRIMARY CARE PROVIDER: Harlan Stains, MD  REFERRING PROVIDER: Harlan Stains, MD [address] [place], [place] [address]  RESPONSIBLE PARTY: Da, Nalani     RECOMMENDATIONS and PLAN:  1. Weakness: secondary to advanced age, poor appetite, weight loss, arthritis and gout pain and loose stool. Would add protein snacks ie peanut butter crackers, greek yogurt etc. Continues in AL setting.  2. Protein calorie malnutrition: perhaps check protein/albumin. Monitor weight. Great weight loss. Add protein supplements that she would enjoy. 3. Loose stool: check for  c diff. Consider DC;ing PPI. 4. Pill burden: consider DC;ing some medication such as MVT, aricept, PPI 5. Open skin area on sacrum..small size...pencile eraser size. No drainage, stop loose stools if able, apply vaseline until wound care nurse can mange with orders and supplies. 6. Pain in feet/hands especially at night. Schedule Tylenol at HS. Continue diclofenac gel twice daily. 7. ACP: met with patient and daughter. Completed MOST form indicating comfort measures only, DNR status. No hospitalizations, no feeding tube, no ventilator. Not quite ready for Hospice, however.   I spent 60 minutes providing this consultation,  from 10:15 am to 11:15 am. More than 50% of the time in this consultation was spent coordinating communication.   HISTORY OF PRESENT ILLNESS:  [name] is a 84 y.o.  female with multiple medical problems with a decline in function and memory. Palliative Care was asked to help address symptom management and goals of care.   CODE STATUS: DNR  PPS: 50% HOSPICE ELIGIBILITY/DIAGNOSIS: TBD, most likely Protein calorie malnutrition  PAST MEDICAL HISTORY:  Past Medical History:  Diagnosis Date  . Balance problem   . Complete heart block ([facility])   . Dyspnea   . GI bleed   . Gout   . Hypercalcemia   .  Hypercholesterolemia   . Hypertension   . Left bundle branch block   . Leukocytosis   . Osteopenia   . Polycythemia   . Polycythemia vera(238.4)   . Pulmonary emboli ([facility])   . Stroke ([facility])   . Syncope   . Thyroid nodule     SOCIAL HX:  Social History   Tobacco Use  . Smoking status: Never Smoker  . Smokeless tobacco: Never Used  . Tobacco comment: never smoked  Substance Use Topics  . Alcohol use: No    Alcohol/week: 0.0 oz    ALLERGIES:  Allergies  Allergen Reactions  . Other Other (See Comments)    Narcotics and sleep aids-hyperactivity, agitation  . Xarelto [Rivaroxaban] Other (See Comments)    bleeding  . Chocolate Other (See Comments)    migraine  . Ropinirole Other (See Comments)    Dizziness, confusion?  . Pollen Extract Other (See Comments)    sneezing     PERTINENT MEDICATIONS:  Outpatient Encounter Medications as of 10/16/2017  Medication Sig  . allopurinol (ZYLOPRIM) 100 MG tablet Take 100 mg by mouth daily.  Marland Kitchen amLODipine (NORVASC) 2.5 MG tablet Take 2.5 mg by mouth daily.  . calcium-vitamin D (OSCAL WITH D) 500-200 MG-UNIT per tablet Take 2 tablets by mouth daily.   . cetirizine (ZYRTEC) 10 MG tablet Take 10 mg by mouth daily.  Marland Kitchen donepezil (ARICEPT) 5 MG tablet Take 5 mg by mouth at bedtime.   . fluticasone (FLONASE) 50 MCG/ACT nasal spray USE 2 SPRAYS INTO EACH NOSTRIL EVERY DAY  . hydrocortisone (ANUSOL-HC) 25 MG suppository   .  latanoprost (XALATAN) 0.005 % ophthalmic solution Place 1 drop into both eyes 2 (two) times daily.  Marland Kitchen loratadine (CLARITIN) 10 MG tablet Take 10 mg by mouth daily.  . memantine (NAMENDA) 10 MG tablet   . Multiple Vitamins-Minerals (PRESERVISION AREDS 2 PO) Take by mouth as directed. Eye vitamin  . NON FORMULARY Apply 4 drops to eye daily.  Marland Kitchen omeprazole (PRILOSEC) 40 MG capsule Take 40 mg by mouth daily.  Vladimir Faster Glycol-Propyl Glycol (SYSTANE ULTRA OP) Apply 1 drop to eye 2 (two) times daily.   Marland Kitchen pyridOXINE (VITAMIN B-6)  100 MG tablet Take 100 mg by mouth daily.  . Wheat Dextrin (BENEFIBER DRINK MIX PO) Take 1 application by mouth daily.    No facility-administered encounter medications on file as of 10/16/2017.     PHYSICAL EXAM:    General: thin, elderly frail Cardiovascular: RRR Pulmonary: CTAB, no cough or wheeze Abdomen: soft, hyperactive BS, NTTP Extremities: using all 4. Needs walker for leg weakness Skin: thin, easily bruised; small eraser sized open area on sacrum/sacrum with erythema, non blanching Neurological: alert; repeats self frequently. Has conversation; +generalized weakness  Karen Rancher, NP

## 2017-10-20 ENCOUNTER — Inpatient Hospital Stay: Payer: Medicare Other | Attending: Oncology

## 2017-10-20 ENCOUNTER — Telehealth: Payer: Self-pay | Admitting: Oncology

## 2017-10-20 ENCOUNTER — Inpatient Hospital Stay (HOSPITAL_BASED_OUTPATIENT_CLINIC_OR_DEPARTMENT_OTHER): Payer: Medicare Other | Admitting: Oncology

## 2017-10-20 ENCOUNTER — Encounter: Payer: Self-pay | Admitting: Oncology

## 2017-10-20 VITALS — BP 160/67 | HR 80 | Temp 98.0°F | Resp 18 | Ht 62.0 in | Wt 103.0 lb

## 2017-10-20 DIAGNOSIS — Z923 Personal history of irradiation: Secondary | ICD-10-CM | POA: Diagnosis not present

## 2017-10-20 DIAGNOSIS — R197 Diarrhea, unspecified: Secondary | ICD-10-CM

## 2017-10-20 DIAGNOSIS — R161 Splenomegaly, not elsewhere classified: Secondary | ICD-10-CM | POA: Diagnosis not present

## 2017-10-20 DIAGNOSIS — D45 Polycythemia vera: Secondary | ICD-10-CM | POA: Diagnosis not present

## 2017-10-20 DIAGNOSIS — K625 Hemorrhage of anus and rectum: Secondary | ICD-10-CM | POA: Diagnosis not present

## 2017-10-20 DIAGNOSIS — L89319 Pressure ulcer of right buttock, unspecified stage: Secondary | ICD-10-CM | POA: Insufficient documentation

## 2017-10-20 LAB — CBC WITH DIFFERENTIAL (CANCER CENTER ONLY)
Basophils Absolute: 0.2 10*3/uL — ABNORMAL HIGH (ref 0.0–0.1)
Basophils Relative: 1 %
EOS ABS: 0.8 10*3/uL — AB (ref 0.0–0.5)
EOS PCT: 2 %
HEMATOCRIT: 48.9 % — AB (ref 34.8–46.6)
HEMOGLOBIN: 14.3 g/dL (ref 11.6–15.9)
LYMPHS ABS: 1.3 10*3/uL (ref 0.9–3.3)
LYMPHS PCT: 4 %
MCH: 19.7 pg — AB (ref 25.1–34.0)
MCHC: 29.2 g/dL — AB (ref 31.5–36.0)
MCV: 67.4 fL — AB (ref 79.5–101.0)
Monocytes Absolute: 1.8 10*3/uL — ABNORMAL HIGH (ref 0.1–0.9)
Monocytes Relative: 5 %
NEUTROS ABS: 34.1 10*3/uL — AB (ref 1.5–6.5)
NRBC: 2 /100{WBCs} — AB
Neutrophils Relative %: 88 %
Platelet Count: 320 10*3/uL (ref 145–400)
RBC: 7.25 MIL/uL — ABNORMAL HIGH (ref 3.70–5.45)
RDW: 26.1 % — ABNORMAL HIGH (ref 11.2–14.5)
WBC Count: 38.7 10*3/uL — ABNORMAL HIGH (ref 3.9–10.3)

## 2017-10-20 NOTE — Progress Notes (Signed)
  [facility] OFFICE PROGRESS NOTE   Diagnosis: Polycythemia vera  INTERVAL HISTORY:   Ms. Morace returns as scheduled.  She reports the left abdomen feels "tight".  She is having intermittent loose stools.  Stools range from formed to watery.  She estimates greater than 2 bowel movements a day.  She reports C. difficile testing has been ordered but she has been unable to collect an adequate sample.  She has stiffness in the hands and feet.  Objective:  Vital signs in last 24 hours:  Blood pressure (!) 160/67, pulse 80, temperature 98 F (36.7 C), temperature source Oral, resp. rate 18, height 5\' 2"  (1.575 m), weight 103 lb (46.7 kg), SpO2 97 %.    HEENT: No thrush or ulcers. Resp: Lungs clear bilaterally. Cardio: Regular rate and rhythm. GI: Spleen palpable left abdomen below the level of the umbilicus with associated tenderness.  No hepatomegaly. Vascular: No leg edema. Skin: Approximate 4 mm superficial skin breakdown at the right buttock.  Surrounding skin is mildly erythematous.   Lab Results:  Lab Results  Component Value Date   WBC 38.7 (H) 10/20/2017   HGB 14.3 10/20/2017   HCT 48.9 (H) 10/20/2017   MCV 67.4 (L) 10/20/2017   PLT 320 10/20/2017   NEUTROABS 34.1 (H) 10/20/2017    Imaging:  No results found.  Medications: I have reviewed the patient's current medications.  Assessment/Plan: 1. Polycythemia vera-JAK-2 positive. 2. History of Iron deficiency secondary to phlebotomy. 3. Bilateral pulmonary embolism 11/27/2014-Xarelto on hold beginning 06/01/2015 secondary to rectal bleeding 4. Painful splenomegaly, status post palliative radiation completed 06/23/2017 5. Indeterminate solid lesions in the left kidney on CT 12/05/2014-evaluated by urology 6. History of Thrombocytopenia-mild, likely related to hydroxyurea.  7. Rectal bleeding-most likely secondary to hemorrhoids. She has been evaluated by gastroenterology 8. Pressure ulcer, early  stage, right buttock 9. Diarrhea-potentially related to spleen radiation.  She continues to have intermittent loose stools.     Disposition: Ms. Nola appears unchanged.  She continues to have significant splenomegaly with associated pain/discomfort.  She declines pain medication.  At the time of her last visit in March of this year options to potentially improve the splenomegaly were discussed.  We discussed this again today.  She continues to decline any treatment at this time.  We reviewed the CBC from today and discussed phlebotomy.  She is not interested in phlebotomy at this time.  She agrees to return for lab and follow-up in 10 weeks.  She will contact the office prior to that visit with any problems.    Ned Card ANP/GNP-BC   10/20/2017  4:01 PM

## 2017-10-20 NOTE — Telephone Encounter (Signed)
Scheduled appt per 5/7 los - sent reminder letter in the mail with appt date and time.

## 2017-10-21 DIAGNOSIS — H01024 Squamous blepharitis left upper eyelid: Secondary | ICD-10-CM | POA: Diagnosis not present

## 2017-10-21 DIAGNOSIS — M79645 Pain in left finger(s): Secondary | ICD-10-CM | POA: Diagnosis not present

## 2017-10-21 DIAGNOSIS — H01021 Squamous blepharitis right upper eyelid: Secondary | ICD-10-CM | POA: Diagnosis not present

## 2017-10-21 DIAGNOSIS — K922 Gastrointestinal hemorrhage, unspecified: Secondary | ICD-10-CM | POA: Diagnosis not present

## 2017-10-21 DIAGNOSIS — M19041 Primary osteoarthritis, right hand: Secondary | ICD-10-CM | POA: Diagnosis not present

## 2017-10-21 DIAGNOSIS — M810 Age-related osteoporosis without current pathological fracture: Secondary | ICD-10-CM | POA: Diagnosis not present

## 2017-10-21 DIAGNOSIS — N2889 Other specified disorders of kidney and ureter: Secondary | ICD-10-CM | POA: Diagnosis not present

## 2017-10-21 DIAGNOSIS — Z0189 Encounter for other specified special examinations: Secondary | ICD-10-CM | POA: Diagnosis not present

## 2017-10-21 DIAGNOSIS — H04213 Epiphora due to excess lacrimation, bilateral lacrimal glands: Secondary | ICD-10-CM | POA: Diagnosis not present

## 2017-10-21 DIAGNOSIS — M6281 Muscle weakness (generalized): Secondary | ICD-10-CM | POA: Diagnosis not present

## 2017-10-21 DIAGNOSIS — H353131 Nonexudative age-related macular degeneration, bilateral, early dry stage: Secondary | ICD-10-CM | POA: Diagnosis not present

## 2017-10-21 DIAGNOSIS — M19042 Primary osteoarthritis, left hand: Secondary | ICD-10-CM | POA: Diagnosis not present

## 2017-10-21 DIAGNOSIS — N3946 Mixed incontinence: Secondary | ICD-10-CM | POA: Diagnosis not present

## 2017-10-21 DIAGNOSIS — M79644 Pain in right finger(s): Secondary | ICD-10-CM | POA: Diagnosis not present

## 2017-10-21 DIAGNOSIS — G459 Transient cerebral ischemic attack, unspecified: Secondary | ICD-10-CM | POA: Diagnosis not present

## 2017-10-22 DIAGNOSIS — M19041 Primary osteoarthritis, right hand: Secondary | ICD-10-CM | POA: Diagnosis not present

## 2017-10-22 DIAGNOSIS — M19042 Primary osteoarthritis, left hand: Secondary | ICD-10-CM | POA: Diagnosis not present

## 2017-10-22 DIAGNOSIS — M6281 Muscle weakness (generalized): Secondary | ICD-10-CM | POA: Diagnosis not present

## 2017-10-22 DIAGNOSIS — M79645 Pain in left finger(s): Secondary | ICD-10-CM | POA: Diagnosis not present

## 2017-10-22 DIAGNOSIS — N3946 Mixed incontinence: Secondary | ICD-10-CM | POA: Diagnosis not present

## 2017-10-22 DIAGNOSIS — M79644 Pain in right finger(s): Secondary | ICD-10-CM | POA: Diagnosis not present

## 2017-10-23 DIAGNOSIS — N3946 Mixed incontinence: Secondary | ICD-10-CM | POA: Diagnosis not present

## 2017-10-23 DIAGNOSIS — M19042 Primary osteoarthritis, left hand: Secondary | ICD-10-CM | POA: Diagnosis not present

## 2017-10-23 DIAGNOSIS — M79644 Pain in right finger(s): Secondary | ICD-10-CM | POA: Diagnosis not present

## 2017-10-23 DIAGNOSIS — M19041 Primary osteoarthritis, right hand: Secondary | ICD-10-CM | POA: Diagnosis not present

## 2017-10-23 DIAGNOSIS — M6281 Muscle weakness (generalized): Secondary | ICD-10-CM | POA: Diagnosis not present

## 2017-10-23 DIAGNOSIS — M79645 Pain in left finger(s): Secondary | ICD-10-CM | POA: Diagnosis not present

## 2017-10-27 DIAGNOSIS — M6281 Muscle weakness (generalized): Secondary | ICD-10-CM | POA: Diagnosis not present

## 2017-10-27 DIAGNOSIS — M19041 Primary osteoarthritis, right hand: Secondary | ICD-10-CM | POA: Diagnosis not present

## 2017-10-27 DIAGNOSIS — M79645 Pain in left finger(s): Secondary | ICD-10-CM | POA: Diagnosis not present

## 2017-10-27 DIAGNOSIS — N3946 Mixed incontinence: Secondary | ICD-10-CM | POA: Diagnosis not present

## 2017-10-27 DIAGNOSIS — M79644 Pain in right finger(s): Secondary | ICD-10-CM | POA: Diagnosis not present

## 2017-10-27 DIAGNOSIS — M19042 Primary osteoarthritis, left hand: Secondary | ICD-10-CM | POA: Diagnosis not present

## 2017-10-28 DIAGNOSIS — M79645 Pain in left finger(s): Secondary | ICD-10-CM | POA: Diagnosis not present

## 2017-10-28 DIAGNOSIS — N3946 Mixed incontinence: Secondary | ICD-10-CM | POA: Diagnosis not present

## 2017-10-28 DIAGNOSIS — M19041 Primary osteoarthritis, right hand: Secondary | ICD-10-CM | POA: Diagnosis not present

## 2017-10-28 DIAGNOSIS — M6281 Muscle weakness (generalized): Secondary | ICD-10-CM | POA: Diagnosis not present

## 2017-10-28 DIAGNOSIS — M79644 Pain in right finger(s): Secondary | ICD-10-CM | POA: Diagnosis not present

## 2017-10-28 DIAGNOSIS — M19042 Primary osteoarthritis, left hand: Secondary | ICD-10-CM | POA: Diagnosis not present

## 2017-10-30 DIAGNOSIS — M6281 Muscle weakness (generalized): Secondary | ICD-10-CM | POA: Diagnosis not present

## 2017-10-30 DIAGNOSIS — M79644 Pain in right finger(s): Secondary | ICD-10-CM | POA: Diagnosis not present

## 2017-10-30 DIAGNOSIS — N3946 Mixed incontinence: Secondary | ICD-10-CM | POA: Diagnosis not present

## 2017-10-30 DIAGNOSIS — M79645 Pain in left finger(s): Secondary | ICD-10-CM | POA: Diagnosis not present

## 2017-10-30 DIAGNOSIS — M19041 Primary osteoarthritis, right hand: Secondary | ICD-10-CM | POA: Diagnosis not present

## 2017-10-30 DIAGNOSIS — M19042 Primary osteoarthritis, left hand: Secondary | ICD-10-CM | POA: Diagnosis not present

## 2017-11-02 DIAGNOSIS — M19041 Primary osteoarthritis, right hand: Secondary | ICD-10-CM | POA: Diagnosis not present

## 2017-11-02 DIAGNOSIS — M79645 Pain in left finger(s): Secondary | ICD-10-CM | POA: Diagnosis not present

## 2017-11-02 DIAGNOSIS — M6281 Muscle weakness (generalized): Secondary | ICD-10-CM | POA: Diagnosis not present

## 2017-11-02 DIAGNOSIS — M19042 Primary osteoarthritis, left hand: Secondary | ICD-10-CM | POA: Diagnosis not present

## 2017-11-02 DIAGNOSIS — N3946 Mixed incontinence: Secondary | ICD-10-CM | POA: Diagnosis not present

## 2017-11-02 DIAGNOSIS — M79644 Pain in right finger(s): Secondary | ICD-10-CM | POA: Diagnosis not present

## 2017-11-03 DIAGNOSIS — R161 Splenomegaly, not elsewhere classified: Secondary | ICD-10-CM | POA: Diagnosis not present

## 2017-11-03 DIAGNOSIS — D751 Secondary polycythemia: Secondary | ICD-10-CM | POA: Diagnosis not present

## 2017-11-03 DIAGNOSIS — I10 Essential (primary) hypertension: Secondary | ICD-10-CM | POA: Diagnosis not present

## 2017-11-03 DIAGNOSIS — G3184 Mild cognitive impairment, so stated: Secondary | ICD-10-CM | POA: Diagnosis not present

## 2017-11-03 DIAGNOSIS — G4700 Insomnia, unspecified: Secondary | ICD-10-CM | POA: Diagnosis not present

## 2017-11-03 DIAGNOSIS — E440 Moderate protein-calorie malnutrition: Secondary | ICD-10-CM | POA: Diagnosis not present

## 2017-11-04 DIAGNOSIS — N3946 Mixed incontinence: Secondary | ICD-10-CM | POA: Diagnosis not present

## 2017-11-04 DIAGNOSIS — M79645 Pain in left finger(s): Secondary | ICD-10-CM | POA: Diagnosis not present

## 2017-11-04 DIAGNOSIS — M6281 Muscle weakness (generalized): Secondary | ICD-10-CM | POA: Diagnosis not present

## 2017-11-04 DIAGNOSIS — M19041 Primary osteoarthritis, right hand: Secondary | ICD-10-CM | POA: Diagnosis not present

## 2017-11-04 DIAGNOSIS — M79644 Pain in right finger(s): Secondary | ICD-10-CM | POA: Diagnosis not present

## 2017-11-04 DIAGNOSIS — M19042 Primary osteoarthritis, left hand: Secondary | ICD-10-CM | POA: Diagnosis not present

## 2017-11-06 DIAGNOSIS — N3946 Mixed incontinence: Secondary | ICD-10-CM | POA: Diagnosis not present

## 2017-11-06 DIAGNOSIS — M79645 Pain in left finger(s): Secondary | ICD-10-CM | POA: Diagnosis not present

## 2017-11-06 DIAGNOSIS — M6281 Muscle weakness (generalized): Secondary | ICD-10-CM | POA: Diagnosis not present

## 2017-11-06 DIAGNOSIS — M19041 Primary osteoarthritis, right hand: Secondary | ICD-10-CM | POA: Diagnosis not present

## 2017-11-06 DIAGNOSIS — M19042 Primary osteoarthritis, left hand: Secondary | ICD-10-CM | POA: Diagnosis not present

## 2017-11-06 DIAGNOSIS — M79644 Pain in right finger(s): Secondary | ICD-10-CM | POA: Diagnosis not present

## 2017-11-10 ENCOUNTER — Telehealth: Payer: Self-pay

## 2017-11-10 DIAGNOSIS — M6281 Muscle weakness (generalized): Secondary | ICD-10-CM | POA: Diagnosis not present

## 2017-11-10 DIAGNOSIS — M19041 Primary osteoarthritis, right hand: Secondary | ICD-10-CM | POA: Diagnosis not present

## 2017-11-10 DIAGNOSIS — N3946 Mixed incontinence: Secondary | ICD-10-CM | POA: Diagnosis not present

## 2017-11-10 DIAGNOSIS — M19042 Primary osteoarthritis, left hand: Secondary | ICD-10-CM | POA: Diagnosis not present

## 2017-11-10 DIAGNOSIS — M79645 Pain in left finger(s): Secondary | ICD-10-CM | POA: Diagnosis not present

## 2017-11-10 DIAGNOSIS — M79644 Pain in right finger(s): Secondary | ICD-10-CM | POA: Diagnosis not present

## 2017-11-10 NOTE — Telephone Encounter (Signed)
Phone call placed to patient's daughter to reschedule time for meeting with Rodena Piety NP.

## 2017-11-11 ENCOUNTER — Other Ambulatory Visit: Payer: Medicare Other | Admitting: Hospice and Palliative Medicine

## 2017-11-11 DIAGNOSIS — Z515 Encounter for palliative care: Secondary | ICD-10-CM

## 2017-11-11 DIAGNOSIS — M19042 Primary osteoarthritis, left hand: Secondary | ICD-10-CM | POA: Diagnosis not present

## 2017-11-11 DIAGNOSIS — M19041 Primary osteoarthritis, right hand: Secondary | ICD-10-CM | POA: Diagnosis not present

## 2017-11-11 DIAGNOSIS — M79644 Pain in right finger(s): Secondary | ICD-10-CM | POA: Diagnosis not present

## 2017-11-11 DIAGNOSIS — M6281 Muscle weakness (generalized): Secondary | ICD-10-CM | POA: Diagnosis not present

## 2017-11-11 DIAGNOSIS — N3946 Mixed incontinence: Secondary | ICD-10-CM | POA: Diagnosis not present

## 2017-11-11 DIAGNOSIS — M79645 Pain in left finger(s): Secondary | ICD-10-CM | POA: Diagnosis not present

## 2017-11-11 NOTE — Progress Notes (Signed)
PALLIATIVE CARE CONSULT VISIT   PATIENT NAME: [name] DOB: [birth year] MRN: [id number]  PRIMARY CARE PROVIDER: Harlan Stains, MD  REFERRING PROVIDER: Harlan Stains, MD [address] [place], [place] [address]  RESPONSIBLE PARTY:   Daughter   RECOMMENDATIONS and PLAN:  1. Nasal drip: mildly improved since Aricept DC'd. 2. Memory loss: early 6 on FAST scale. [place]. She has adjusted well. She has now been given the Exelon patch vs. Aricept. She continues on Namenda. She is alert and not word searching today. 3. Pain: complains of pain in her left hand especially and both legs and feet at night. She is refusing to take Tylenol unless absolutely necessary. She has agreed to try some arthritis cream, thought. Daughter will buy at the pharmacy. I think Biofreeze or something stronger might be too uncomfortable for her.  4. Loose stool: c diff negative. She says her stools are "better" 3 to 4 times a day. I recommended some cholestyramine but she is not open to this "yet." Perhaps next month? 5. Skin breakdown: sacral area...still painful at times. Bandages coming off. The facility had a care plan meeting today and are going to try a different, waterproof covering. 5. ACP: DNR on chart. MOST form completed last visit but is no longer on chart? She desires comfort measures only and avoid hospital.  I spent 15 minutes providing this consultation,  from 1:05pm to 1:20pm. More than 50% of the time in this consultation was spent interviewing patient and daughter and coordinating communication.   HISTORY OF PRESENT ILLNESS:  [name] is a 84 y.o. female with multiple medical problems who was  recent[place] moved [place] to [place] at [facility]. Palliative Care was asked to help address symptom management and  goals of care. Today's visit is a follow up from last visit addressing symptoms again.  CODE STATUS: DNR  PPS: 50%% HOSPICE  ELIGIBILITY/DIAGNOSIS: TBD  PAST MEDICAL HISTORY:  Past Medical History:  Diagnosis Date  . Balance problem   . Complete heart block ([facility])   . Dyspnea   . GI bleed   . Gout   . Hypercalcemia   . Hypercholesterolemia   . Hypertension   . Left bundle branch block   . Leukocytosis   . Osteopenia   . Polycythemia   . Polycythemia vera(238.4)   . Pulmonary emboli ([facility])   . Stroke ([facility])   . Syncope   . Thyroid nodule     SOCIAL HX:  Social History   Tobacco Use  . Smoking status: Never Smoker  . Smokeless tobacco: Never Used  . Tobacco comment: never smoked  Substance Use Topics  . Alcohol use: No    Alcohol/week: 0.0 oz    ALLERGIES:  Allergies  Allergen Reactions  . Other Other (See Comments)    Narcotics and sleep aids-hyperactivity, agitation  . Xarelto [Rivaroxaban] Other (See Comments)    bleeding  . Chocolate Other (See Comments)    migraine  . Ropinirole Other (See Comments)    Dizziness, confusion?  . Pollen Extract Other (See Comments)    sneezing     PERTINENT MEDICATIONS:  Outpatient Encounter Medications as of 11/11/2017  Medication Sig  . allopurinol (ZYLOPRIM) 100 MG tablet Take 100 mg by mouth daily.  Marland Kitchen amLODipine (NORVASC) 2.5 MG tablet Take 2.5 mg by mouth daily.  . calcium-vitamin D (OSCAL WITH D) 500-200 MG-UNIT per tablet Take 2 tablets by mouth  daily.   . cetirizine (ZYRTEC) 10 MG tablet Take 10 mg by mouth daily.  Marland Kitchen donepezil (ARICEPT) 5 MG tablet Take 5 mg by mouth at bedtime.   . fluticasone (FLONASE) 50 MCG/ACT nasal spray USE 2 SPRAYS INTO EACH NOSTRIL EVERY DAY  . hydrocortisone (ANUSOL-HC) 25 MG suppository   . latanoprost (XALATAN) 0.005 % ophthalmic solution Place 1 drop into both eyes 2 (two) times daily.  Marland Kitchen loratadine (CLARITIN) 10 MG tablet Take 10 mg by mouth daily.  . memantine (NAMENDA) 10 MG tablet   . Multiple Vitamins-Minerals (PRESERVISION AREDS 2 PO) Take by mouth as directed. Eye vitamin  . NON FORMULARY Apply 4 drops  to eye daily.  Marland Kitchen omeprazole (PRILOSEC) 40 MG capsule Take 40 mg by mouth daily.  Vladimir Faster Glycol-Propyl Glycol (SYSTANE ULTRA OP) Apply 1 drop to eye 2 (two) times daily.   Marland Kitchen pyridOXINE (VITAMIN B-6) 100 MG tablet Take 100 mg by mouth daily.  . Wheat Dextrin (BENEFIBER DRINK MIX PO) Take 1 application by mouth daily.   Marland Kitchen XIIDRA 5 % SOLN USE 1 DROP IN BOTH EYES 2 TIMES A DAY   No facility-administered encounter medications on file as of 11/11/2017.     PHYSICAL EXAM:   General: Alert, elderly Caucasian female appearing very "spry" today Cardiovascular: RRR; +PM Pulmonary: CTAB Abdomen: softly rounded; hyperactive BS, NTTP Extremities: uses all 4. Walks with walker on long walks Skin: thin, scattered bruising on forearms; easily torn Neurological: alert; speaking in full sentences today; having conversation  Nathanial Rancher, NP

## 2017-11-13 DIAGNOSIS — M79644 Pain in right finger(s): Secondary | ICD-10-CM | POA: Diagnosis not present

## 2017-11-13 DIAGNOSIS — M19041 Primary osteoarthritis, right hand: Secondary | ICD-10-CM | POA: Diagnosis not present

## 2017-11-13 DIAGNOSIS — M6281 Muscle weakness (generalized): Secondary | ICD-10-CM | POA: Diagnosis not present

## 2017-11-13 DIAGNOSIS — N3946 Mixed incontinence: Secondary | ICD-10-CM | POA: Diagnosis not present

## 2017-11-13 DIAGNOSIS — M79645 Pain in left finger(s): Secondary | ICD-10-CM | POA: Diagnosis not present

## 2017-11-13 DIAGNOSIS — M19042 Primary osteoarthritis, left hand: Secondary | ICD-10-CM | POA: Diagnosis not present

## 2017-11-16 DIAGNOSIS — M79644 Pain in right finger(s): Secondary | ICD-10-CM | POA: Diagnosis not present

## 2017-11-16 DIAGNOSIS — M19041 Primary osteoarthritis, right hand: Secondary | ICD-10-CM | POA: Diagnosis not present

## 2017-11-16 DIAGNOSIS — K922 Gastrointestinal hemorrhage, unspecified: Secondary | ICD-10-CM | POA: Diagnosis not present

## 2017-11-16 DIAGNOSIS — M6281 Muscle weakness (generalized): Secondary | ICD-10-CM | POA: Diagnosis not present

## 2017-11-16 DIAGNOSIS — N3946 Mixed incontinence: Secondary | ICD-10-CM | POA: Diagnosis not present

## 2017-11-16 DIAGNOSIS — H1045 Other chronic allergic conjunctivitis: Secondary | ICD-10-CM | POA: Diagnosis not present

## 2017-11-16 DIAGNOSIS — G459 Transient cerebral ischemic attack, unspecified: Secondary | ICD-10-CM | POA: Diagnosis not present

## 2017-11-16 DIAGNOSIS — M79645 Pain in left finger(s): Secondary | ICD-10-CM | POA: Diagnosis not present

## 2017-11-16 DIAGNOSIS — M19042 Primary osteoarthritis, left hand: Secondary | ICD-10-CM | POA: Diagnosis not present

## 2017-11-16 DIAGNOSIS — M810 Age-related osteoporosis without current pathological fracture: Secondary | ICD-10-CM | POA: Diagnosis not present

## 2017-11-16 DIAGNOSIS — N2889 Other specified disorders of kidney and ureter: Secondary | ICD-10-CM | POA: Diagnosis not present

## 2017-11-16 DIAGNOSIS — D72829 Elevated white blood cell count, unspecified: Secondary | ICD-10-CM | POA: Diagnosis not present

## 2017-11-17 DIAGNOSIS — M79645 Pain in left finger(s): Secondary | ICD-10-CM | POA: Diagnosis not present

## 2017-11-17 DIAGNOSIS — M19042 Primary osteoarthritis, left hand: Secondary | ICD-10-CM | POA: Diagnosis not present

## 2017-11-17 DIAGNOSIS — N3946 Mixed incontinence: Secondary | ICD-10-CM | POA: Diagnosis not present

## 2017-11-17 DIAGNOSIS — M19041 Primary osteoarthritis, right hand: Secondary | ICD-10-CM | POA: Diagnosis not present

## 2017-11-17 DIAGNOSIS — M6281 Muscle weakness (generalized): Secondary | ICD-10-CM | POA: Diagnosis not present

## 2017-11-17 DIAGNOSIS — M79644 Pain in right finger(s): Secondary | ICD-10-CM | POA: Diagnosis not present

## 2017-11-18 DIAGNOSIS — H16143 Punctate keratitis, bilateral: Secondary | ICD-10-CM | POA: Diagnosis not present

## 2017-11-18 DIAGNOSIS — M19042 Primary osteoarthritis, left hand: Secondary | ICD-10-CM | POA: Diagnosis not present

## 2017-11-18 DIAGNOSIS — H04213 Epiphora due to excess lacrimation, bilateral lacrimal glands: Secondary | ICD-10-CM | POA: Diagnosis not present

## 2017-11-18 DIAGNOSIS — M79644 Pain in right finger(s): Secondary | ICD-10-CM | POA: Diagnosis not present

## 2017-11-18 DIAGNOSIS — M19041 Primary osteoarthritis, right hand: Secondary | ICD-10-CM | POA: Diagnosis not present

## 2017-11-18 DIAGNOSIS — M6281 Muscle weakness (generalized): Secondary | ICD-10-CM | POA: Diagnosis not present

## 2017-11-18 DIAGNOSIS — H04123 Dry eye syndrome of bilateral lacrimal glands: Secondary | ICD-10-CM | POA: Diagnosis not present

## 2017-11-18 DIAGNOSIS — M79645 Pain in left finger(s): Secondary | ICD-10-CM | POA: Diagnosis not present

## 2017-11-18 DIAGNOSIS — N3946 Mixed incontinence: Secondary | ICD-10-CM | POA: Diagnosis not present

## 2017-11-27 DIAGNOSIS — M545 Low back pain: Secondary | ICD-10-CM | POA: Diagnosis not present

## 2017-11-27 DIAGNOSIS — W06XXXA Fall from bed, initial encounter: Secondary | ICD-10-CM | POA: Diagnosis not present

## 2017-11-27 DIAGNOSIS — M25551 Pain in right hip: Secondary | ICD-10-CM | POA: Diagnosis not present

## 2017-11-27 DIAGNOSIS — S59911A Unspecified injury of right forearm, initial encounter: Secondary | ICD-10-CM | POA: Diagnosis not present

## 2017-11-27 DIAGNOSIS — R079 Chest pain, unspecified: Secondary | ICD-10-CM | POA: Diagnosis not present

## 2017-11-27 DIAGNOSIS — R6884 Jaw pain: Secondary | ICD-10-CM | POA: Diagnosis not present

## 2017-11-28 DIAGNOSIS — M545 Low back pain: Secondary | ICD-10-CM | POA: Diagnosis not present

## 2017-11-28 DIAGNOSIS — M79631 Pain in right forearm: Secondary | ICD-10-CM | POA: Diagnosis not present

## 2017-11-29 DIAGNOSIS — M545 Low back pain: Secondary | ICD-10-CM | POA: Diagnosis not present

## 2017-11-29 DIAGNOSIS — M25521 Pain in right elbow: Secondary | ICD-10-CM | POA: Diagnosis not present

## 2017-12-02 ENCOUNTER — Emergency Department (HOSPITAL_COMMUNITY)
Admission: EM | Admit: 2017-12-02 | Discharge: 2017-12-02 | Disposition: A | Discharge status: Home / Self Care | Payer: Medicare Other | Attending: Emergency Medicine | Admitting: Emergency Medicine

## 2017-12-02 ENCOUNTER — Other Ambulatory Visit: Payer: Self-pay

## 2017-12-02 ENCOUNTER — Encounter (HOSPITAL_COMMUNITY): Payer: Self-pay | Admitting: Radiology

## 2017-12-02 ENCOUNTER — Emergency Department (HOSPITAL_COMMUNITY): Payer: Medicare Other

## 2017-12-02 DIAGNOSIS — D45 Polycythemia vera: Secondary | ICD-10-CM | POA: Insufficient documentation

## 2017-12-02 DIAGNOSIS — W19XXXD Unspecified fall, subsequent encounter: Secondary | ICD-10-CM | POA: Diagnosis not present

## 2017-12-02 DIAGNOSIS — I10 Essential (primary) hypertension: Secondary | ICD-10-CM | POA: Insufficient documentation

## 2017-12-02 DIAGNOSIS — Z950 Presence of cardiac pacemaker: Secondary | ICD-10-CM | POA: Diagnosis not present

## 2017-12-02 DIAGNOSIS — R51 Headache: Secondary | ICD-10-CM | POA: Diagnosis not present

## 2017-12-02 DIAGNOSIS — M545 Low back pain, unspecified: Secondary | ICD-10-CM

## 2017-12-02 DIAGNOSIS — S0990XA Unspecified injury of head, initial encounter: Secondary | ICD-10-CM | POA: Diagnosis not present

## 2017-12-02 DIAGNOSIS — Z8673 Personal history of transient ischemic attack (TIA), and cerebral infarction without residual deficits: Secondary | ICD-10-CM | POA: Insufficient documentation

## 2017-12-02 DIAGNOSIS — Z7401 Bed confinement status: Secondary | ICD-10-CM | POA: Diagnosis not present

## 2017-12-02 DIAGNOSIS — Z79899 Other long term (current) drug therapy: Secondary | ICD-10-CM | POA: Insufficient documentation

## 2017-12-02 DIAGNOSIS — M2550 Pain in unspecified joint: Secondary | ICD-10-CM | POA: Diagnosis not present

## 2017-12-02 DIAGNOSIS — M25551 Pain in right hip: Secondary | ICD-10-CM | POA: Diagnosis not present

## 2017-12-02 DIAGNOSIS — R100 Acute abdomen: Secondary | ICD-10-CM | POA: Diagnosis not present

## 2017-12-02 DIAGNOSIS — Y92129 Unspecified place in nursing home as the place of occurrence of the external cause: Secondary | ICD-10-CM | POA: Diagnosis not present

## 2017-12-02 DIAGNOSIS — S0083XD Contusion of other part of head, subsequent encounter: Secondary | ICD-10-CM | POA: Insufficient documentation

## 2017-12-02 DIAGNOSIS — S3991XA Unspecified injury of abdomen, initial encounter: Secondary | ICD-10-CM | POA: Diagnosis not present

## 2017-12-02 DIAGNOSIS — R1031 Right lower quadrant pain: Secondary | ICD-10-CM | POA: Diagnosis not present

## 2017-12-02 DIAGNOSIS — M5489 Other dorsalgia: Secondary | ICD-10-CM | POA: Diagnosis not present

## 2017-12-02 DIAGNOSIS — R1084 Generalized abdominal pain: Secondary | ICD-10-CM | POA: Diagnosis not present

## 2017-12-02 LAB — URINALYSIS, ROUTINE W REFLEX MICROSCOPIC
Bilirubin Urine: NEGATIVE
Glucose, UA: NEGATIVE mg/dL
Hgb urine dipstick: NEGATIVE
Ketones, ur: NEGATIVE mg/dL
LEUKOCYTES UA: NEGATIVE
Nitrite: NEGATIVE
Protein, ur: NEGATIVE mg/dL
Specific Gravity, Urine: 1.021 (ref 1.005–1.030)
pH: 5 (ref 5.0–8.0)

## 2017-12-02 LAB — I-STAT CHEM 8, ED
BUN: 57 mg/dL — ABNORMAL HIGH (ref 6–20)
CREATININE: 1.3 mg/dL — AB (ref 0.44–1.00)
Calcium, Ion: 1.18 mmol/L (ref 1.15–1.40)
Chloride: 109 mmol/L (ref 101–111)
Glucose, Bld: 88 mg/dL (ref 65–99)
HEMATOCRIT: 51 % — AB (ref 36.0–46.0)
HEMOGLOBIN: 17.3 g/dL — AB (ref 12.0–15.0)
POTASSIUM: 5.7 mmol/L — AB (ref 3.5–5.1)
Sodium: 141 mmol/L (ref 135–145)
TCO2: 22 mmol/L (ref 22–32)

## 2017-12-02 LAB — CBC WITH DIFFERENTIAL/PLATELET
Basophils Absolute: 0.1 10*3/uL (ref 0.0–0.1)
Basophils Relative: 0 %
Eosinophils Absolute: 1.2 10*3/uL — ABNORMAL HIGH (ref 0.0–0.7)
Eosinophils Relative: 3 %
HEMATOCRIT: 49.7 % — AB (ref 36.0–46.0)
Hemoglobin: 14.5 g/dL (ref 12.0–15.0)
LYMPHS ABS: 1.5 10*3/uL (ref 0.7–4.0)
Lymphocytes Relative: 4 %
MCH: 20.1 pg — ABNORMAL LOW (ref 26.0–34.0)
MCHC: 29.2 g/dL — ABNORMAL LOW (ref 30.0–36.0)
MCV: 69 fL — ABNORMAL LOW (ref 78.0–100.0)
Monocytes Absolute: 1.7 10*3/uL — ABNORMAL HIGH (ref 0.1–1.0)
Monocytes Relative: 5 %
NEUTROS ABS: 30.5 10*3/uL — AB (ref 1.7–7.7)
NEUTROS PCT: 88 %
PLATELETS: 253 10*3/uL (ref 150–400)
RBC: 7.2 MIL/uL — AB (ref 3.87–5.11)
RDW: 25.9 % — ABNORMAL HIGH (ref 11.5–15.5)
WBC: 35 10*3/uL — AB (ref 4.0–10.5)

## 2017-12-02 MED ORDER — OXYCODONE-ACETAMINOPHEN 5-325 MG PO TABS: 1.0000 | ORAL_TABLET | Freq: Three times a day (TID) | ORAL | 0 refills | Status: AC | PRN

## 2017-12-02 MED ORDER — OXYCODONE-ACETAMINOPHEN 5-325 MG PO TABS
1.0000 | ORAL_TABLET | Freq: Once | ORAL | Status: AC
  Administered 2017-12-02: 1 via ORAL
  Filled 2017-12-02: qty 1

## 2017-12-02 MED ORDER — FENTANYL CITRATE (PF) 100 MCG/2ML IJ SOLN
50.0000 ug | Freq: Once | INTRAMUSCULAR | Status: AC
  Administered 2017-12-02: 50 ug via INTRAVENOUS
  Filled 2017-12-02: qty 2

## 2017-12-02 MED ORDER — LACTATED RINGERS IV BOLUS
1000.0000 mL | Freq: Once | INTRAVENOUS | Status: AC
  Administered 2017-12-02: 1000 mL via INTRAVENOUS

## 2017-12-02 MED ORDER — SODIUM CHLORIDE 0.9 % IV BOLUS
500.0000 mL | Freq: Once | INTRAVENOUS | Status: AC
  Administered 2017-12-02: 500 mL via INTRAVENOUS

## 2017-12-02 MED ORDER — LACTATED RINGERS IV BOLUS: 1000.0000 mL | Freq: Once | INTRAVENOUS | Status: DC

## 2017-12-02 NOTE — ED Notes (Signed)
Pt back from CT

## 2017-12-02 NOTE — Discharge Instructions (Signed)
You have been evaluated for your back pain after a fall.  CT scan of your spine is concerning for potential spine infection or potential cancer.  We recommend admission for further management, however we do want to abide your wish to return to your facility.  Please share the CT result with your primary care provider.  Take percocet as needed for pain but avoid taking other tylenol product while taking percocet.  Feel free to return to the ER if your condition worsen or if you any other concerns.

## 2017-12-02 NOTE — ED Triage Notes (Signed)
Transported by Corey Harold from [facility]. Experienced a fall on 6/14, fell out of bed. Presents today with right flank pain, right elbow pain,  decreased mobility, generalized weakness, SHOB and pain. AAO x 3. Bruise to right side of chin. Initially refused transport on 6/14, denies hitting her head or any LOC.

## 2017-12-02 NOTE — ED Provider Notes (Signed)
[name] Provider Note   CSN: [id number] Arrival date & time: 12/02/17  1052     History   Chief Complaint Chief Complaint  Patient presents with  . Fall    HPI [name] is a 84 y.o. female.  HPI   84 year old female with history of heart block status post pacemaker, balance problem, PE not on any blood thinning medication stroke, syncope, brought here via EMS from [place] for evaluation of a fall.  History obtained through patient and also through daughter who is at bedside.  6 days ago patient had a unwitnessed fall at her nursing facility.  From the fall, she suffered bruising and skin tear primary on the right side of her body.  She has bruising to the right side of face, and multiple skin tears to her forearms.  She was complaining of pain to her arms, right hip, and lower back from the fall.  She is unable to share what has happened that caused the fall.  Since then she has had multiple x-rays of her right elbow, forearm, hip, and lower back.  Aside from a possible hairline fracture of her right elbow which she does not complain of any significant pain, no other acute fractures or dislocation were noted.  However, for the past 2 days patient has not been active as per usual.  She is mostly in her room.  She continues to endorse pain to her lower back and right hip.  Pain is moderate in severity.  She has been taking tramadol and Tylenol without relief.  She has been evaluated by her PCP as well as her nurse and it was recommended for patient to come here for further evaluation of her low back pain as she may benefit from a CT scan.  Past Medical History:  Diagnosis Date  . Balance problem   . Complete heart block ([place])   . Dyspnea   . GI bleed   . Gout   . Hypercalcemia   . Hypercholesterolemia   . Hypertension   . Left bundle branch block   . Leukocytosis   . Osteopenia   . Polycythemia   . Polycythemia  vera(238.4)   . Pulmonary emboli ([place])   . Stroke ([place])   . Syncope   . Thyroid nodule     Patient Active Problem List   Diagnosis Date Noted  . Palliative care encounter 10/09/2017  . Paroxysmal dyspnea 09/10/2015  . History of pulmonary embolism 09/10/2015  . Dyspnea 12/05/2014  . Balance problems 12/05/2014  . Left flank pain 12/05/2014  . Balance problem 12/05/2014  . GI bleed 11/29/2014  . Acute pulmonary embolus ([place]) 11/27/2014  . Polycythemia rubra vera ([place]) 11/27/2014  . Leukocytosis 11/27/2014  . Chest pain   . Essential hypertension 09/18/2014  . Mobitz type II atrioventricular block 06/30/2011  . Syncope 06/30/2011  . Polycythemia 05/21/2011  . HYPERTENSION, UNSPECIFIED 07/12/2010  . DYSPNEA 07/12/2010  . PACEMAKER 07/12/2010    Past Surgical History:  Procedure Laterality Date  . APPENDECTOMY    . ESOPHAGOGASTRODUODENOSCOPY N/A 11/30/2014   Procedure: ESOPHAGOGASTRODUODENOSCOPY (EGD);  Surgeon: Laurence Spates, MD;  Location: [facility] ENDOSCOPY;  Service: Endoscopy;  Laterality: N/A;  . HAND SURGERY     S/P Carpal tunnel repair  . PACEMAKER INSERTION  03/26/09   MDT [address]pta DR, revised for RV lead fracture by Dr Doreatha Lew  . TRANSTHORACIC ECHOCARDIOGRAM  03/26/2009  . US ECHOCARDIOGRAPHY  03/01/2009   EF 55-60%  OB History   None      Home Medications    Prior to Admission medications   Medication Sig Start Date End Date Taking? Authorizing Provider  allopurinol (ZYLOPRIM) 100 MG tablet Take 100 mg by mouth daily. 07/14/17   [provider]  amLODipine (NORVASC) 2.5 MG tablet Take 2.5 mg by mouth daily. 06/16/16   [provider]  calcium-vitamin D (OSCAL WITH D) 500-200 MG-UNIT per tablet Take 2 tablets by mouth daily.     [provider]  cetirizine (ZYRTEC) 10 MG tablet Take 10 mg by mouth daily.    [provider]  fluticasone (FLONASE) 50 MCG/ACT nasal spray USE 2 SPRAYS INTO EACH NOSTRIL EVERY DAY 07/05/15   [provider]  hydrocortisone (ANUSOL-HC) 25 MG suppository  07/19/16   [provider]  latanoprost (XALATAN) 0.005 % ophthalmic solution Place 1 drop into both eyes 2 (two) times daily.    [provider]  loratadine (CLARITIN) 10 MG tablet Take 10 mg by mouth daily.    [provider]  memantine (NAMENDA) 10 MG tablet  09/21/16   [provider]  Multiple Vitamins-Minerals (PRESERVISION AREDS 2 PO) Take by mouth as directed. Eye vitamin    [provider]  NON FORMULARY Apply 4 drops to eye daily.    [provider]  omeprazole (PRILOSEC) 40 MG capsule Take 40 mg by mouth daily. 03/15/15   [provider]  Polyethyl Glycol-Propyl Glycol (SYSTANE ULTRA OP) Apply 1 drop to eye 2 (two) times daily.     [provider]  pyridOXINE (VITAMIN B-6) 100 MG tablet Take 100 mg by mouth daily.    [provider]  Wheat Dextrin (BENEFIBER DRINK MIX PO) Take 1 application by mouth daily.     [provider]  XIIDRA 5 % SOLN USE 1 DROP IN BOTH EYES 2 TIMES A DAY 09/10/17   [provider]    Family History Family History  Adopted: Yes  Problem Relation Age of Onset  . Migraines Son   . Heart disease Son        CABGx5  . Stroke Son 44  . Canavan disease Son        prostate  . Pulmonary embolism Daughter        x2 (age 82 & 57)  . Deep vein thrombosis Daughter   . Asthma Daughter   . Migraines Daughter   . Depression Daughter   . Other Daughter        Acid reflux    Social History Social History   Tobacco Use  . Smoking status: Never Smoker  . Smokeless tobacco: Never Used  . Tobacco comment: never smoked  Substance Use Topics  . Alcohol use: No    Alcohol/week: 0.0 oz  . Drug use: No     Allergies   Other; Xarelto [rivaroxaban]; Chocolate; Ropinirole; and Pollen extract   Review of Systems Review of Systems  All other systems reviewed and are negative.    Physical Exam Updated  Vital Signs BP (!) 129/56 (BP Location: Left Arm)   Pulse 70   Temp 97.9 F (36.6 C) (Oral)   Resp 18   Ht 5\' 1"  (1.549 m)   Wt 46.7 kg (103 lb)   SpO2 95%   BMI 19.46 kg/m   Physical Exam  Constitutional: She appears well-developed and well-nourished. No distress.  HENT:  Head: Atraumatic.  Faint ecchymosis noted to right side of jaw with mild  tenderness to palpation.  Eyes: Conjunctivae are normal.  Neck: Neck supple.  Cardiovascular: Normal rate and regular rhythm.  Pulmonary/Chest: Effort normal and breath sounds normal.  Abdominal: Soft.  Splenomegaly noted.  Tenderness to right hip and right lower abdomen on palpation.  Musculoskeletal: She exhibits tenderness. She exhibits no deformity.  Tenderness along the lumbar spine on palpation.  Tenderness to R posterior hip on palpation.  Able to flex and extend both hip with some effort.  Decrease strength to BLE with intact DP pulses. Brisk cap refill  Neurological: She is alert. No cranial nerve deficit. GCS eye subscore is 4. GCS verbal subscore is 5. GCS motor subscore is 6.  Alert to self and place.    Skin: No rash noted.  Small skin tears noted to R upper arm and posterior elbow.  Psychiatric: She has a normal mood and affect.  Nursing note and vitals reviewed.    ED Treatments / Results  Labs (all labs ordered are listed, but only abnormal results are displayed) Labs Reviewed  CBC WITH DIFFERENTIAL/PLATELET - Abnormal; Notable for the following components:      Result Value   WBC 35.0 (*)    RBC 7.20 (*)    HCT 49.7 (*)    MCV 69.0 (*)    MCH 20.1 (*)    MCHC 29.2 (*)    RDW 25.9 (*)    Neutro Abs 30.5 (*)    Monocytes Absolute 1.7 (*)    Eosinophils Absolute 1.2 (*)    All other components within normal limits  URINALYSIS, ROUTINE W REFLEX MICROSCOPIC - Abnormal; Notable for the following components:   Color, Urine AMBER (*)    All other components within normal limits  I-STAT CHEM 8, ED - Abnormal;  Notable for the following components:   Potassium 5.7 (*)    BUN 57 (*)    Creatinine, Ser 1.30 (*)    Hemoglobin 17.3 (*)    HCT 51.0 (*)    All other components within normal limits    EKG EKG Interpretation  Date/Time:  Wednesday December 02 2017 12:06:20 EDT Ventricular Rate:  73 PR Interval:    QRS Duration: 140 QT Interval:  478 QTC Calculation: 527 R Axis:   -158 Text Interpretation:  Atrial-sensed ventricular-paced complexes No further analysis attempted due to paced rhythm No significant change since last tracing Confirmed by Varney Biles ([id number]) on 12/02/2017 3:17:15 PM   Radiology Ct Abdomen Pelvis Wo Contrast  Result Date: 12/02/2017 CLINICAL DATA:  [place] last Friday, generalized pain, increasing low back pain. History of splenomegaly. Polycythemia vera. EXAM: CT ABDOMEN AND PELVIS WITHOUT CONTRAST TECHNIQUE: Multidetector CT imaging of the abdomen and pelvis was performed following the standard protocol without IV contrast. COMPARISON:  CT abdomen dated 04/30/2017. CT abdomen dated 12/05/2014. FINDINGS: Lower chest: LEFT pleural effusion, at least moderate in size, incompletely imaged, with adjacent compressive atelectasis, also present on earlier CT. Smaller RIGHT pleural effusion with adjacent atelectasis, also present on the earlier CT. Hepatobiliary: Stable hepatic cysts. Liver appears otherwise unremarkable. Gallbladder is unremarkable. No bile duct dilatation. Pancreas: Unremarkable. No pancreatic ductal dilatation or surrounding inflammatory changes. Spleen: Markedly enlarged, not significantly changed in the interval. No evidence of splenic injury or perisplenic hematoma. Adrenals/Urinary Tract: Bilateral renal cysts better demonstrated on previous CT with contrast. No hydronephrosis seen. No ureteral or bladder calculi identified. Bladder is unremarkable, partially decompressed. Stomach/Bowel: No dilated large or small bowel loops. Scattered diverticulosis throughout the  colon, most prominent within the  sigmoid colon, but no focal inflammatory change appreciated to suggest acute diverticulitis. Moderate amount of stool and gas throughout the nondistended colon. Stomach is unremarkable. No bowel wall thickening or evidence of bowel wall inflammation seen. Vascular/Lymphatic: Aortic atherosclerosis. No enlarged abdominal or pelvic lymph nodes. Reproductive: No adnexal mass or free fluid appreciated. Other: No free fluid or abscess collection. No free intraperitoneal air. Musculoskeletal: There is amorphous soft tissue thickening along the anterior margins of the L5 and S1 vertebral bodies. No acute or suspicious osseous finding. IMPRESSION: 1. Amorphous soft tissue thickening along the anterior margins of the L5 and S1 vertebral bodies, measuring up to approximately 2.5 cm thickness. Given the location, this is concerning for an adjacent discitis and/or osteomyelitis, alternatively neoplastic. Recommend MRI of the lumbar spine with contrast for further characterization. 2. Stable marked splenomegaly. 3. Bilateral pleural effusions. 4. Colonic diverticulosis without evidence of acute diverticulitis. 5. Aortic atherosclerosis. 6. Additional chronic/incidental findings detailed above. These results will be called to the ordering clinician or representative by the Radiologist Assistant, and communication documented in the PACS or zVision Dashboard. Electronically Signed   By: Franki Cabot M.D.   On: 12/02/2017 13:33   Ct Head Wo Contrast  Result Date: 12/02/2017 CLINICAL DATA:  [place] last Friday, generalized pain, increasing low back pain. History of splenomegaly. Polycythemia vera. EXAM: CT HEAD WITHOUT CONTRAST TECHNIQUE: Contiguous axial images were obtained from the base of the skull through the vertex without intravenous contrast. COMPARISON:  Head CT dated 02/28/2015. FINDINGS: Brain: Mild generalized volume loss with commensurate dilatation of the ventricles and sulci.  Ventricles are stable in size and configuration. Mild chronic small vessel ischemic changes noted within the deep periventricular white matter regions bilaterally. No mass, hemorrhage, edema or other evidence of acute parenchymal abnormality. No extra-axial hemorrhage. Vascular: There are chronic calcified atherosclerotic changes of the large vessels at the skull base. No unexpected hyperdense vessel. Skull: Normal. Negative for fracture or focal lesion. Sinuses/Orbits: No acute finding. Other: None. IMPRESSION: 1. No acute findings. No intracranial mass, hemorrhage or edema. No skull fracture. 2. Mild chronic small vessel ischemic change in the white matter. Electronically Signed   By: Franki Cabot M.D.   On: 12/02/2017 13:22    Procedures Procedures (including critical care time)  Medications Ordered in ED Medications  sodium chloride 0.9 % bolus 500 mL (0 mLs Intravenous Stopped 12/02/17 1316)  lactated ringers bolus 1,000 mL (0 mLs Intravenous Stopped 12/02/17 1508)  fentaNYL (SUBLIMAZE) injection 50 mcg (50 mcg Intravenous Given 12/02/17 1230)  oxyCODONE-acetaminophen (PERCOCET/ROXICET) 5-325 MG per tablet 1 tablet (1 tablet Oral Given 12/02/17 1509)     Initial Impression / Assessment and Plan / ED Course  I have reviewed the triage vital signs and the nursing notes.  Pertinent labs & imaging results that were available during my care of the patient were reviewed by me and considered in my medical decision making (see chart for details).     BP (!) 149/67 (BP Location: Left Arm)   Pulse 78   Temp 97.9 F (36.6 C) (Oral)   Resp 13   Ht 5\' 1"  (1.549 m)   Wt 46.7 kg (103 lb)   SpO2 93%   BMI 19.46 kg/m    Final Clinical Impressions(s) / ED Diagnoses   Final diagnoses:  Low back pain at multiple sites  Fall at nursing home, subsequent encounter    ED Discharge Orders        Ordered    oxyCODONE-acetaminophen (PERCOCET/ROXICET) 5-325  MG tablet  Every 8 hours PRN     12/02/17  1529     Patient fell several days ago still having persistent pain to her lower back.  She has had multiple x-rays without acute finding but she became less active and pain is not well controlled with home pain medication.  She is here for further evaluation.  Labs remarkable for an elevated white count of 35 however it is similar to her prior baseline.  Urine shows no signs of urinary tract infection.  Her renal function is mildly abnormal with BUN 57, creatinine 1.3.  She received IV fluid here.  She has history of polycythemia vera and her hemoglobin is 17.3.  Head CT scan shows no acute abnormal fevers, chills finding.  Abdominal pelvis CT scan show soft tissue thickening along the anterior margin of L5 and S1 concerning for discitis or osteomyelitis or neoplastic.  It is recommended for patient to obtain MRI for further characterization.  However, patient does have hardware that would preclude her from MRI imaging.  We did discuss the the CT finding and recommend patient to be admitted for further evaluation including IV antibiotic.  Patient acknowledged the finding as well as the risks of not get admitted for treatment for potential osteomyelitis or discitis.  She prefers to return back to her facility, but understand that she can return if her condition worsen.  Patient has a DNR/DNI status.  She understands that the risk can be potential death.  She is able to make informed decision.  Her daughter is at bedside. Care discussed with Dr. Kathrynn Humble.      Domenic Moras, PA-C 12/02/17 [place], Ankit, MD 12/02/17 [phone]

## 2017-12-02 NOTE — ED Notes (Signed)
[facility] Called for transport to [place]

## 2017-12-02 NOTE — ED Notes (Signed)
Patient transported to CT 

## 2017-12-03 ENCOUNTER — Non-Acute Institutional Stay: Payer: Medicare Other | Admitting: Hospice and Palliative Medicine

## 2017-12-03 ENCOUNTER — Other Ambulatory Visit: Payer: Self-pay | Admitting: Physician Assistant

## 2017-12-03 DIAGNOSIS — D72829 Elevated white blood cell count, unspecified: Secondary | ICD-10-CM | POA: Diagnosis not present

## 2017-12-03 DIAGNOSIS — D751 Secondary polycythemia: Secondary | ICD-10-CM | POA: Diagnosis not present

## 2017-12-03 DIAGNOSIS — W06XXXA Fall from bed, initial encounter: Secondary | ICD-10-CM

## 2017-12-03 DIAGNOSIS — D434 Neoplasm of uncertain behavior of spinal cord: Secondary | ICD-10-CM | POA: Diagnosis not present

## 2017-12-03 DIAGNOSIS — I10 Essential (primary) hypertension: Secondary | ICD-10-CM | POA: Diagnosis not present

## 2017-12-03 DIAGNOSIS — M109 Gout, unspecified: Secondary | ICD-10-CM | POA: Diagnosis not present

## 2017-12-03 DIAGNOSIS — K219 Gastro-esophageal reflux disease without esophagitis: Secondary | ICD-10-CM | POA: Diagnosis not present

## 2017-12-03 DIAGNOSIS — J301 Allergic rhinitis due to pollen: Secondary | ICD-10-CM | POA: Diagnosis not present

## 2017-12-03 DIAGNOSIS — G309 Alzheimer's disease, unspecified: Secondary | ICD-10-CM | POA: Diagnosis not present

## 2017-12-03 DIAGNOSIS — H409 Unspecified glaucoma: Secondary | ICD-10-CM | POA: Diagnosis not present

## 2017-12-03 DIAGNOSIS — Z515 Encounter for palliative care: Secondary | ICD-10-CM

## 2017-12-03 DIAGNOSIS — I2609 Other pulmonary embolism with acute cor pulmonale: Secondary | ICD-10-CM | POA: Diagnosis not present

## 2017-12-03 DIAGNOSIS — E46 Unspecified protein-calorie malnutrition: Secondary | ICD-10-CM | POA: Diagnosis not present

## 2017-12-03 DIAGNOSIS — M545 Low back pain: Secondary | ICD-10-CM

## 2017-12-03 DIAGNOSIS — K648 Other hemorrhoids: Secondary | ICD-10-CM | POA: Diagnosis not present

## 2017-12-04 DIAGNOSIS — D72829 Elevated white blood cell count, unspecified: Secondary | ICD-10-CM | POA: Diagnosis not present

## 2017-12-04 DIAGNOSIS — G309 Alzheimer's disease, unspecified: Secondary | ICD-10-CM | POA: Diagnosis not present

## 2017-12-04 DIAGNOSIS — I2609 Other pulmonary embolism with acute cor pulmonale: Secondary | ICD-10-CM | POA: Diagnosis not present

## 2017-12-04 DIAGNOSIS — D434 Neoplasm of uncertain behavior of spinal cord: Secondary | ICD-10-CM | POA: Diagnosis not present

## 2017-12-04 DIAGNOSIS — E46 Unspecified protein-calorie malnutrition: Secondary | ICD-10-CM | POA: Diagnosis not present

## 2017-12-04 DIAGNOSIS — D751 Secondary polycythemia: Secondary | ICD-10-CM | POA: Diagnosis not present

## 2017-12-05 DIAGNOSIS — E46 Unspecified protein-calorie malnutrition: Secondary | ICD-10-CM | POA: Diagnosis not present

## 2017-12-05 DIAGNOSIS — D72829 Elevated white blood cell count, unspecified: Secondary | ICD-10-CM | POA: Diagnosis not present

## 2017-12-05 DIAGNOSIS — I2609 Other pulmonary embolism with acute cor pulmonale: Secondary | ICD-10-CM | POA: Diagnosis not present

## 2017-12-05 DIAGNOSIS — G309 Alzheimer's disease, unspecified: Secondary | ICD-10-CM | POA: Diagnosis not present

## 2017-12-05 DIAGNOSIS — D434 Neoplasm of uncertain behavior of spinal cord: Secondary | ICD-10-CM | POA: Diagnosis not present

## 2017-12-05 DIAGNOSIS — D751 Secondary polycythemia: Secondary | ICD-10-CM | POA: Diagnosis not present

## 2017-12-05 NOTE — Progress Notes (Signed)
PALLIATIVE CARE CONSULT VISIT   PATIENT NAME: [name] DOB: [birth year] MRN: [id number]  PRIMARY CARE PROVIDER: Harlan Stains, MD  REFERRING PROVIDER: Harlan Stains, MD [place], [place] [id number]  RESPONSIBLE PARTY:  daughter    RECOMMENDATIONS and PLAN:  1.Pain: currently has been addressed with primary MD and what pharmacy can provide. Currently has roxicodone on board. There was question as to whether PCP indicated palliative care or hospice care. Dr. Dema Severin called this NP back to clarify. She has already sent an order over for hospice care. Pain is now better controlled. Patient has had her first dose of roxicodone.2. ACP: DNR status. Comfort measures only. Hospice services.  I spent 30 minutes providing this consultation,  from 2:30pm  to 3:00pm. More than 50% of the time in this consultation was spent reviewing medical records, interviewing staff, interviewing patient and daughter and coordinating communication.   HISTORY OF PRESENT ILLNESS:  [name] is a 84 y.o. year old female with multiple medical problems as seen below. She had recent ED visit post fall with extreme low back pain. This pain was found to be related to discitis vs osteomyelitis vs neoplastic origin. Patient did not want an MRI, nor did she want to remain in the hospital for further work up or IV abx treatment. She wanted to return to her AL setting at [facility] for comfort measures. Palliative Care was asked by patient's daughter  to help her determine if hospice care was appropriate.    CODE STATUS: DNR  PPS: 30% HOSPICE ELIGIBILITY/DIAGNOSIS: yes, polycythemia vera, CVA, heart block; sepsis without treatment  PAST MEDICAL HISTORY:  Past Medical History:  Diagnosis Date  . Balance problem   . Complete heart block ([place])   . Dyspnea   . GI bleed   . Gout   . Hypercalcemia   . Hypercholesterolemia   . Hypertension   . Left bundle branch block   . Leukocytosis   .  Osteopenia   . Polycythemia   . Polycythemia vera(238.4)   . Pulmonary emboli ([place])   . Stroke ([place])   . Syncope   . Thyroid nodule     SOCIAL HX:  Social History   Tobacco Use  . Smoking status: Never Smoker  . Smokeless tobacco: Never Used  . Tobacco comment: never smoked  Substance Use Topics  . 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 Other Other (See Comments)    Narcotics and sleep aids-hyperactivity, agitation  . Xarelto [Rivaroxaban] Other (See Comments)    bleeding  . Chocolate Other (See Comments)    migraine  . Ropinirole Other (See Comments)    Dizziness, confusion  . Pollen Extract Other (See Comments)    sneezing     PERTINENT MEDICATIONS:  Outpatient Encounter Medications as of 12/03/2017  Medication Sig  . allopurinol (ZYLOPRIM) 100 MG tablet Take 100 mg by mouth daily.  Marland Kitchen amLODipine (NORVASC) 2.5 MG tablet Take 2.5 mg by mouth daily.  . B COMPLEX-C-FOLIC ACID PO Take 1 tablet by mouth daily.  . calcium-vitamin D (OSCAL 500/200 D-3) 500-200 MG-UNIT tablet Take 1 tablet by mouth 2 (two) times daily.  . carboxymethylcellul-glycerin (OPTIVE) 0.5-0.9 % ophthalmic solution Place 1 drop into both eyes 3 (three) times daily.  . colchicine 0.6 MG tablet Take 0.6 mg by mouth 2 (two) times daily as needed (  gout attack).  . fluticasone (FLONASE) 50 MCG/ACT nasal spray Place 2 sprays into both nostrils daily as needed for allergies or rhinitis.  . hydrocortisone (ANUSOL-HC) 25 MG suppository Place 25 mg rectally as needed (rectal bleeding).   Marland Kitchen Lifitegrast (XIIDRA) 5 % SOLN Place 1 drop into both eyes 2 (two) times daily.  Marland Kitchen loratadine (CLARITIN) 10 MG tablet Take 10 mg by mouth daily.  . memantine (NAMENDA) 10 MG tablet Take 10 mg by mouth 2 (two) times daily.   . mirtazapine (REMERON) 15 MG tablet Take 15 mg by mouth at bedtime.  . Multiple Vitamins-Minerals ([employer]  SPECTRAVITE ADULT 50+ PO) Take 1 tablet by mouth daily.  . Multiple Vitamins-Minerals (PRESERVISION AREDS 2) CAPS Take 1 capsule by mouth 2 (two) times daily.   Marland Kitchen omeprazole (PRILOSEC) 40 MG capsule Take 40 mg by mouth daily.  Marland Kitchen oxyCODONE-acetaminophen (PERCOCET/ROXICET) 5-325 MG tablet Take 1 tablet by mouth every 8 (eight) hours as needed for moderate pain or severe pain.  . rivastigmine (EXELON) 4.6 mg/24hr Place 4.6 mg onto the skin daily. Apply once a day for 30 days. Remove old patch before applying new one.  . traMADol (ULTRAM) 50 MG tablet Take 25 mg by mouth every 6 (six) hours as needed for moderate pain.  . Wheat Dextrin (BENEFIBER DRINK MIX PO) Take 5 mLs by mouth daily. 1 tbsp in liquid every day   No facility-administered encounter medications on file as of 12/03/2017.     PHYSICAL EXAM:  +pain in lower back but improved; mild grimacing..still remains so polite  General: frail, elderly female sitting on cough in NAD Cardiovascular: irreg rhythm; reg rate; + murmur Pulmonary: diminished in bases; CTAB otherwise Abdomen: rounded; appears bloated; mildly tender Extremities: lower legs with trace edema bilat Skin: thin, bruised; easily torn skin Neurological: alert; +generalized weakness; +low back pain; +memory loss  Nathanial Rancher, NP

## 2017-12-06 DIAGNOSIS — D72829 Elevated white blood cell count, unspecified: Secondary | ICD-10-CM | POA: Diagnosis not present

## 2017-12-06 DIAGNOSIS — I2609 Other pulmonary embolism with acute cor pulmonale: Secondary | ICD-10-CM | POA: Diagnosis not present

## 2017-12-06 DIAGNOSIS — G309 Alzheimer's disease, unspecified: Secondary | ICD-10-CM | POA: Diagnosis not present

## 2017-12-06 DIAGNOSIS — E46 Unspecified protein-calorie malnutrition: Secondary | ICD-10-CM | POA: Diagnosis not present

## 2017-12-06 DIAGNOSIS — D434 Neoplasm of uncertain behavior of spinal cord: Secondary | ICD-10-CM | POA: Diagnosis not present

## 2017-12-06 DIAGNOSIS — D751 Secondary polycythemia: Secondary | ICD-10-CM | POA: Diagnosis not present

## 2017-12-07 DIAGNOSIS — G309 Alzheimer's disease, unspecified: Secondary | ICD-10-CM | POA: Diagnosis not present

## 2017-12-07 DIAGNOSIS — D751 Secondary polycythemia: Secondary | ICD-10-CM | POA: Diagnosis not present

## 2017-12-07 DIAGNOSIS — D434 Neoplasm of uncertain behavior of spinal cord: Secondary | ICD-10-CM | POA: Diagnosis not present

## 2017-12-07 DIAGNOSIS — E46 Unspecified protein-calorie malnutrition: Secondary | ICD-10-CM | POA: Diagnosis not present

## 2017-12-07 DIAGNOSIS — D72829 Elevated white blood cell count, unspecified: Secondary | ICD-10-CM | POA: Diagnosis not present

## 2017-12-07 DIAGNOSIS — I2609 Other pulmonary embolism with acute cor pulmonale: Secondary | ICD-10-CM | POA: Diagnosis not present

## 2017-12-08 DIAGNOSIS — I2609 Other pulmonary embolism with acute cor pulmonale: Secondary | ICD-10-CM | POA: Diagnosis not present

## 2017-12-08 DIAGNOSIS — G309 Alzheimer's disease, unspecified: Secondary | ICD-10-CM | POA: Diagnosis not present

## 2017-12-08 DIAGNOSIS — E46 Unspecified protein-calorie malnutrition: Secondary | ICD-10-CM | POA: Diagnosis not present

## 2017-12-08 DIAGNOSIS — D434 Neoplasm of uncertain behavior of spinal cord: Secondary | ICD-10-CM | POA: Diagnosis not present

## 2017-12-08 DIAGNOSIS — D72829 Elevated white blood cell count, unspecified: Secondary | ICD-10-CM | POA: Diagnosis not present

## 2017-12-08 DIAGNOSIS — D751 Secondary polycythemia: Secondary | ICD-10-CM | POA: Diagnosis not present

## 2017-12-14 DEATH — deceased

## 2017-12-16 ENCOUNTER — Encounter: Payer: Medicare Other | Admitting: *Deleted

## 2017-12-29 ENCOUNTER — Ambulatory Visit: Payer: Medicare Other | Admitting: Oncology

## 2017-12-29 ENCOUNTER — Telehealth: Payer: Self-pay

## 2017-12-29 ENCOUNTER — Other Ambulatory Visit: Payer: Medicare Other

## 2017-12-29 NOTE — Telephone Encounter (Signed)
Call from pt daughter to report that pt [name] Sep 06, 2024 passed away. Daughter requesting that appts be cancelled. This RN returned call to offer sympathy and condolences. Daughter voiced gratitude. This RN will inform Dr. Benay Spice.
